# Patient Record
Sex: Male | Born: 1960
Health system: Southern US, Community
[De-identification: ages and names within clinical notes are randomized; demographics above are authoritative.]

## PROBLEM LIST (undated history)

## (undated) DIAGNOSIS — M1990 Unspecified osteoarthritis, unspecified site: Secondary | ICD-10-CM

## (undated) DIAGNOSIS — K5190 Ulcerative colitis, unspecified, without complications: Secondary | ICD-10-CM

## (undated) DIAGNOSIS — M069 Rheumatoid arthritis, unspecified: Secondary | ICD-10-CM

## (undated) DIAGNOSIS — K219 Gastro-esophageal reflux disease without esophagitis: Secondary | ICD-10-CM

## (undated) HISTORY — DX: Gastro-esophageal reflux disease without esophagitis: K21.9

## (undated) HISTORY — PX: HERNIA REPAIR: SHX51

## (undated) HISTORY — PX: COLONOSCOPY: SHX174

## (undated) HISTORY — DX: Rheumatoid arthritis, unspecified: M06.9

## (undated) HISTORY — PX: ANTERIOR CRUCIATE LIGAMENT REPAIR: SHX115

## (undated) HISTORY — DX: Unspecified osteoarthritis, unspecified site: M19.90

## (undated) HISTORY — DX: Ulcerative colitis, unspecified, without complications: K51.90

## (undated) HISTORY — PX: MOUTH SURGERY: SHX715

---

## 1998-01-12 ENCOUNTER — Encounter: Payer: Self-pay | Admitting: Emergency Medicine

## 1998-01-12 ENCOUNTER — Emergency Department (HOSPITAL_COMMUNITY): Admission: EM | Admit: 1998-01-12 | Discharge: 1998-01-12 | Payer: Self-pay | Admitting: Emergency Medicine

## 2012-08-13 ENCOUNTER — Ambulatory Visit (INDEPENDENT_AMBULATORY_CARE_PROVIDER_SITE_OTHER): Payer: Self-pay | Admitting: Family Medicine

## 2012-08-13 ENCOUNTER — Encounter: Payer: Self-pay | Admitting: Family Medicine

## 2012-08-13 VITALS — BP 110/68 | HR 76 | Temp 98.2°F | Resp 16 | Wt 176.0 lb

## 2012-08-13 DIAGNOSIS — K921 Melena: Secondary | ICD-10-CM

## 2012-08-13 DIAGNOSIS — M069 Rheumatoid arthritis, unspecified: Secondary | ICD-10-CM | POA: Insufficient documentation

## 2012-08-13 DIAGNOSIS — R197 Diarrhea, unspecified: Secondary | ICD-10-CM

## 2012-08-13 LAB — CBC WITH DIFFERENTIAL/PLATELET
Basophils Absolute: 0.1 10*3/uL (ref 0.0–0.1)
Basophils Relative: 1 % (ref 0–1)
Eosinophils Absolute: 0.4 10*3/uL (ref 0.0–0.7)
Eosinophils Relative: 6 % — ABNORMAL HIGH (ref 0–5)
HCT: 40 % (ref 39.0–52.0)
Hemoglobin: 13.3 g/dL (ref 13.0–17.0)
Lymphocytes Relative: 38 % (ref 12–46)
Lymphs Abs: 2.3 10*3/uL (ref 0.7–4.0)
MCH: 30.9 pg (ref 26.0–34.0)
MCHC: 33.3 g/dL (ref 30.0–36.0)
MCV: 92.8 fL (ref 78.0–100.0)
Monocytes Absolute: 0.7 10*3/uL (ref 0.1–1.0)
Monocytes Relative: 12 % (ref 3–12)
Neutro Abs: 2.7 10*3/uL (ref 1.7–7.7)
Neutrophils Relative %: 43 % (ref 43–77)
Platelets: 320 10*3/uL (ref 150–400)
RBC: 4.31 MIL/uL (ref 4.22–5.81)
RDW: 13.3 % (ref 11.5–15.5)
WBC: 6.2 10*3/uL (ref 4.0–10.5)

## 2012-08-13 LAB — BASIC METABOLIC PANEL
BUN: 8 mg/dL (ref 6–23)
CO2: 26 mEq/L (ref 19–32)
Calcium: 9.1 mg/dL (ref 8.4–10.5)
Chloride: 104 mEq/L (ref 96–112)
Creat: 1.03 mg/dL (ref 0.50–1.35)
Glucose, Bld: 99 mg/dL (ref 70–99)
Potassium: 4.9 mEq/L (ref 3.5–5.3)
Sodium: 138 mEq/L (ref 135–145)

## 2012-08-13 LAB — HEPATIC FUNCTION PANEL
ALT: 16 U/L (ref 0–53)
AST: 18 U/L (ref 0–37)
Albumin: 3.7 g/dL (ref 3.5–5.2)
Alkaline Phosphatase: 95 U/L (ref 39–117)
Bilirubin, Direct: 0.1 mg/dL (ref 0.0–0.3)
Indirect Bilirubin: 0.5 mg/dL (ref 0.0–0.9)
Total Bilirubin: 0.6 mg/dL (ref 0.3–1.2)
Total Protein: 6.7 g/dL (ref 6.0–8.3)

## 2012-08-13 LAB — TSH: TSH: 1.739 u[IU]/mL (ref 0.350–4.500)

## 2012-08-13 LAB — SEDIMENTATION RATE: Sed Rate: 32 mm/hr — ABNORMAL HIGH (ref 0–16)

## 2012-08-13 NOTE — Progress Notes (Signed)
  Subjective:    Patient ID: Terry Guerrero, male    DOB: 1960-05-10, 52 y.o.   MRN: [id number]  HPI  Patient reports several months of diarrhea immediately after eating. He states that he'll feel sudden urge to defecate. He then has a very watery bowel movement. He reports hematochezia. The blood is mixed in with the stool. It is not just from wiping. His significant past medical history of premature arthritis. Any fevers or myalgias or arthralgias. He denies any weight loss. Past Medical History  Diagnosis Date  . Rheumatoid arthritis    Current outpatient prescriptions:omeprazole (PRILOSEC) 20 MG capsule, Take 20 mg by mouth daily., Disp: , Rfl:     Review of Systems  All other systems reviewed and are negative.       Objective:   Physical Exam  Constitutional: He appears well-developed and well-nourished.  Neck: Neck supple. No thyromegaly present.  Cardiovascular: Normal rate, normal heart sounds and intact distal pulses.   No murmur heard. Pulmonary/Chest: Effort normal and breath sounds normal. No respiratory distress. He has no wheezes. He has no rales. He exhibits no tenderness.  Abdominal: Soft. Bowel sounds are normal. He exhibits no distension. There is no tenderness. There is no rebound and no guarding.          Assessment & Plan:  1. Hematochezia I am concerned this could reflect an underlying inflammatory bowel disease. I feel he needs a colonoscopy, not just for screening purposes, but to rule out inflammatory bowel disease. However a GI - Basic Metabolic Panel - Hepatic Function Panel - CBC with Differential - Sedimentation Rate - TSH - Ambulatory referral to Gastroenterology  2. Diarrhea We'll obtain some laboratory studies to rule out other metabolic causes of diarrhea. Differential diagnosis includes infectious colitis, inflammatory bowel disease, irritable bowel disease. - Basic Metabolic Panel - Hepatic Function Panel - CBC with Differential -  Sedimentation Rate - TSH - Ambulatory referral to Gastroenterology

## 2012-08-15 ENCOUNTER — Encounter: Payer: Self-pay | Admitting: Gastroenterology

## 2012-08-27 ENCOUNTER — Ambulatory Visit (INDEPENDENT_AMBULATORY_CARE_PROVIDER_SITE_OTHER): Payer: Self-pay | Admitting: Gastroenterology

## 2012-08-27 ENCOUNTER — Encounter: Payer: Self-pay | Admitting: Gastroenterology

## 2012-08-27 VITALS — BP 118/64 | HR 74 | Ht 67.0 in | Wt 178.0 lb

## 2012-08-27 DIAGNOSIS — K921 Melena: Secondary | ICD-10-CM

## 2012-08-27 DIAGNOSIS — R197 Diarrhea, unspecified: Secondary | ICD-10-CM

## 2012-08-27 MED ORDER — PEG-KCL-NACL-NASULF-NA ASC-C 100 G PO SOLR: 1.0000 | Freq: Once | ORAL | Status: DC

## 2012-08-27 NOTE — Progress Notes (Signed)
History of Present Illness: This is a 52 year old male who relates a history of bloody diarrhea beginning in December 2013. He was incarcerated when his symptoms began and has now been released. He was seen by his primary physician and blood work was performed that was unremarkable except for elevated sedimentation rate. He relates 4-8 bowel movements a day including nocturnal diarrhea. He notes frequent rectal bleeding with bowel movements. He has lower abdominal cramping prior to a BM and it resolves rapidly follow a BM. His appetite is good. Denies weight loss, abdominal pain, constipation, change in stool caliber, melena, nausea, vomiting, dysphagia, reflux symptoms, chest pain.   Review of Systems: Pertinent positive and negative review of systems were noted in the above HPI section. All other review of systems were otherwise negative.  Current Medications, Allergies, Past Medical History, Past Surgical History, Family History and Social History were reviewed in [employer] record.  Physical Exam: General: Well developed , well nourished, no acute distress Head: Normocephalic and atraumatic Eyes:  sclerae anicteric, EOMI Ears: Normal auditory acuity Mouth: No deformity or lesions Neck: Supple, no masses or thyromegaly Lungs: Clear throughout to auscultation Heart: Regular rate and rhythm; no murmurs, rubs or bruits Abdomen: Soft, non tender and non distended. No masses, hepatosplenomegaly or hernias noted. Normal Bowel sounds Rectal: No lesions, trace Hemoccult-positive soft stool in the rectal vault Musculoskeletal: Symmetrical with no gross deformities  Skin: No lesions on visible extremities Pulses:  Normal pulses noted Extremities: No clubbing, cyanosis, edema or deformities noted Neurological: Alert oriented x 4, grossly nonfocal Cervical Nodes:  No significant cervical adenopathy Inguinal Nodes: No significant inguinal adenopathy Psychological:  Alert and  cooperative. Normal mood and affect  Assessment and Recommendations:  1. Diarrhea and hematochezia. Rule out chronic infections, IBD, colorectal neoplasms and other disorders. Obtain all standard stool studies. Schedule colonoscopy. The risks, benefits, and alternatives to colonoscopy with possible biopsy and possible polypectomy were discussed with the patient and they consent to proceed.

## 2012-08-27 NOTE — Patient Instructions (Addendum)
You have been scheduled for a colonoscopy. Please follow written instructions given to you at your visit today.  Please pick up your prep kit at the pharmacy within the next 1-3 days. If you use inhalers (even only as needed), please bring them with you on the day of your procedure. Your physician has requested that you go to www.startemmi.com and enter the access code given to you at your visit today. This web site gives a general overview about your procedure. However, you should still follow specific instructions given to you by our office regarding your preparation for the procedure.  Thank you for choosing me and [facility].  Malcolm T. Stark, Jr., MD., FACG  

## 2012-08-28 ENCOUNTER — Other Ambulatory Visit: Payer: Self-pay

## 2012-08-28 DIAGNOSIS — R197 Diarrhea, unspecified: Secondary | ICD-10-CM

## 2012-08-28 DIAGNOSIS — K921 Melena: Secondary | ICD-10-CM

## 2012-09-02 ENCOUNTER — Telehealth: Payer: Self-pay | Admitting: Gastroenterology

## 2012-09-02 NOTE — Telephone Encounter (Signed)
OK 

## 2012-09-02 NOTE — Telephone Encounter (Signed)
Patient states he cannot afford the Movi prep at this time because he has no insurance. Told patient that I can give him a free prep if he will come by the office today. Patient then stated he actually would like cancel the Colonoscopy for tomorrow. Asked patient why he wanted to cancel now. Patient states he is worried about him not having insurance and the cost of the procedure added to other bills he has to pay. Patient states he gets insurance the middle of next month and will call back when he gets it then. Told him that it was important he call back and schedule and I will send this note to Dr. Fuller Plan to let him know.

## 2012-09-03 ENCOUNTER — Ambulatory Visit (HOSPITAL_COMMUNITY): Admission: RE | Admit: 2012-09-03 | Payer: Self-pay | Source: Ambulatory Visit | Admitting: Gastroenterology

## 2012-09-03 ENCOUNTER — Encounter (HOSPITAL_COMMUNITY): Admission: RE | Payer: Self-pay | Source: Ambulatory Visit

## 2012-09-03 SURGERY — COLONOSCOPY
Anesthesia: Moderate Sedation

## 2015-12-14 ENCOUNTER — Telehealth: Payer: Self-pay | Admitting: Family Medicine

## 2015-12-14 NOTE — Telephone Encounter (Signed)
Terry Guerrero called saying he now has [employer] and has gotten back on his feet and he's wondering if he can return as a patient of Dr. Dennard Schaumann. If possible, he's hoping to have his outstanding balance paid in full within the next couple of months. He'd like a phone call regarding this to see if he can return as a patient. Please give him a call.

## 2016-01-04 NOTE — Telephone Encounter (Signed)
I called and spoke with patient. He is aware that he has to pay off the bad debt first and was given the amount. He states he should have that taken care of within the next 30 days. He likes Dr. Dennard Schaumann and now has insurance.

## 2016-04-05 ENCOUNTER — Ambulatory Visit (INDEPENDENT_AMBULATORY_CARE_PROVIDER_SITE_OTHER): Payer: Medicare HMO | Admitting: Physician Assistant

## 2016-04-05 ENCOUNTER — Encounter: Payer: Self-pay | Admitting: Physician Assistant

## 2016-04-05 VITALS — BP 120/78 | HR 65 | Temp 97.9°F | Resp 16 | Wt 145.0 lb

## 2016-04-05 DIAGNOSIS — K4000 Bilateral inguinal hernia, with obstruction, without gangrene, not specified as recurrent: Secondary | ICD-10-CM | POA: Diagnosis not present

## 2016-04-05 MED ORDER — HYDROCODONE-ACETAMINOPHEN 5-325 MG PO TABS: 1.0000 | ORAL_TABLET | Freq: Four times a day (QID) | ORAL | 0 refills | Status: DC | PRN

## 2016-04-05 NOTE — Progress Notes (Addendum)
Patient ID: Terry Guerrero MRN: [id number], DOB: January 24, 1961, 55 y.o. Date of Encounter: 04/05/2016, 1:47 PM    Chief Complaint:  Chief Complaint  Patient presents with  . hernia in groin    lifting furniture     HPI: 55 y.o. year old male presents with above.   He reports he has no prior history of hernia. He states that for the past 6-8 months he has been seeing some bowl and feeling some discomfort with certain movements. However  he developed significantly increased amount of pain and symptoms for the past 2 days. He reports that on Tuesday 04/03/16 he moved address her in the chest of 12 hours. Is that he had lifted it and then he stepped entire and and that is when he developed severe increased pain and symptoms. Says that he thinks that there is a hernia in both groins, but worse on the right. Can feel the one on the right descending down towards his right testicle.     Home Meds:   Outpatient Medications Prior to Visit  Medication Sig Dispense Refill  . omeprazole (PRILOSEC) 20 MG capsule Take 20 mg by mouth daily.    . peg 3350 powder (MOVIPREP) 100 G SOLR Take 1 kit (100 g total) by mouth once. (Patient not taking: Reported on 04/05/2016) 1 kit 0   No facility-administered medications prior to visit.     Allergies:  Allergies  Allergen Reactions  . Gold-Containing Drug Products   . Naprosyn [Naproxen]       Review of Systems: See HPI for pertinent ROS. All other ROS negative.    Physical Exam: Blood pressure 120/78, pulse 65, temperature 97.9 F (36.6 C), temperature source Oral, resp. rate 16, weight 145 lb (65.8 kg), SpO2 98 %., Body mass index is 22.71 kg/m. General:  WNWD WM. Appears in no acute distress. Neck: Supple. No thyromegaly. No lymphadenopathy. Lungs: Clear bilaterally to auscultation without wheezes, rales, or rhonchi. Breathing is unlabored. Heart: Regular rhythm. No murmurs, rubs, or gallops. Abdomen: Soft, non-tender, non-distended with  normoactive bowel sounds. No hepatomegaly. No rebound/guarding. No obvious abdominal masses. Terry Guerrero, CMA was chaperone during exam: There is a bulge in right lower abdomen---this is reducible. There is hernia into inguinal canal on the right. Msk:  Strength and tone normal for age. Extremities/Skin: Warm and dry. Neuro: Alert and oriented X 3. Moves all extremities spontaneously. Gait is normal. CNII-XII grossly in tact. Psych:  Responds to questions appropriately with a normal affect.     ASSESSMENT AND PLAN:  55 y.o. year old male with  1. Bilateral inguinal hernia with obstruction and without gangrene, recurrence not specified He is experiencing pain. Prescribed hydrocodone to use for pain control. Will refer to Gen. Surgery--I have marked this as urgent. Discussed that he needs to avoid lifting and pushing pulling etc. He states that he is not employed he currently so this is not an issue. - HYDROcodone-acetaminophen (NORCO/VICODIN) 5-325 MG tablet; Take 1 tablet by mouth every 6 (six) hours as needed.  Dispense: 30 tablet; Refill: 0 - Ambulatory referral to General Surgery Addendum:  I checked with our Referral Nurse. Appt with Gen Surgery not until Jan 3rd. Had Her call back to [facility] to see if they could possibly see him sooner as I'm concerned he could have strangulated hernia. They stated they have no other openings sooner and that if his symptoms worsen and he needs to go to the emergency room. I called patient  to inform him of this. Got no answer but did get voicemail-- left message stating this that if symptoms worsen-- go to the emergency room.  [address] [place], [place], [facility] 04/05/2016 1:47 PM

## 2016-04-06 ENCOUNTER — Telehealth: Payer: Self-pay

## 2016-04-06 NOTE — Telephone Encounter (Signed)
Pt was seen in the office 12-14 and states he had a rough night last night with burning in his right leg and is req a RX be sent in for muscle spasms.  Pls advise

## 2016-04-09 MED ORDER — CYCLOBENZAPRINE HCL 10 MG PO TABS: 10.0000 mg | ORAL_TABLET | Freq: Three times a day (TID) | ORAL | 1 refills | Status: DC | PRN

## 2016-04-09 NOTE — Addendum Note (Signed)
Addended by: Vonna Kotyk A on: 04/09/2016 11:52 AM   Modules accepted: Orders

## 2016-04-09 NOTE — Telephone Encounter (Signed)
Flexeril 37m 1 po TID PRN muscle relaxant # 30 + 1 Caution pt this may cause drowsiness

## 2016-04-09 NOTE — Telephone Encounter (Signed)
Rx filled spoke with pt he is aware

## 2016-04-13 ENCOUNTER — Other Ambulatory Visit: Payer: Self-pay

## 2016-04-13 DIAGNOSIS — K4000 Bilateral inguinal hernia, with obstruction, without gangrene, not specified as recurrent: Secondary | ICD-10-CM

## 2016-04-13 MED ORDER — TRAMADOL HCL 50 MG PO TABS: ORAL_TABLET | ORAL | 0 refills | Status: DC

## 2016-04-13 NOTE — Telephone Encounter (Signed)
rx called in pt aware

## 2016-04-13 NOTE — Telephone Encounter (Signed)
Tell pt this med has to be hard copy and our office closing for holiday.  Will change to Tramadol, which can be called in. Tramadol 52m 1 -2 every 8 hours as needed for pain # 90 + 0

## 2016-04-13 NOTE — Telephone Encounter (Signed)
Pt called and stated he seems to be fine and is stable. Pt states his pain med is working however he is about out Pt states he is taking the pills every 4-6 hours and has enough to last until 12-25.  Ok to refill?

## 2016-04-25 ENCOUNTER — Other Ambulatory Visit: Payer: Self-pay | Admitting: Surgery

## 2016-04-25 DIAGNOSIS — K4020 Bilateral inguinal hernia, without obstruction or gangrene, not specified as recurrent: Secondary | ICD-10-CM | POA: Diagnosis not present

## 2016-05-05 ENCOUNTER — Other Ambulatory Visit: Payer: Self-pay | Admitting: Physician Assistant

## 2016-05-07 NOTE — Telephone Encounter (Signed)
ok 

## 2016-05-07 NOTE — Telephone Encounter (Signed)
Refills called in

## 2016-05-07 NOTE — Telephone Encounter (Signed)
Ok to refill 

## 2016-05-14 DIAGNOSIS — K4020 Bilateral inguinal hernia, without obstruction or gangrene, not specified as recurrent: Secondary | ICD-10-CM | POA: Diagnosis not present

## 2016-11-12 ENCOUNTER — Encounter: Payer: Self-pay | Admitting: Family Medicine

## 2016-11-12 ENCOUNTER — Ambulatory Visit (INDEPENDENT_AMBULATORY_CARE_PROVIDER_SITE_OTHER): Payer: Medicare HMO | Admitting: Family Medicine

## 2016-11-12 ENCOUNTER — Ambulatory Visit (HOSPITAL_COMMUNITY)
Admission: RE | Admit: 2016-11-12 | Discharge: 2016-11-12 | Disposition: A | Discharge status: Home / Self Care | Payer: Medicare HMO | Source: Ambulatory Visit | Attending: Family Medicine | Admitting: Family Medicine

## 2016-11-12 VITALS — BP 128/80 | HR 76 | Temp 97.3°F | Resp 18 | Ht 67.0 in | Wt 146.0 lb

## 2016-11-12 DIAGNOSIS — S29011A Strain of muscle and tendon of front wall of thorax, initial encounter: Secondary | ICD-10-CM

## 2016-11-12 DIAGNOSIS — R0789 Other chest pain: Secondary | ICD-10-CM | POA: Diagnosis not present

## 2016-11-12 DIAGNOSIS — R091 Pleurisy: Secondary | ICD-10-CM | POA: Diagnosis not present

## 2016-11-12 DIAGNOSIS — R079 Chest pain, unspecified: Secondary | ICD-10-CM | POA: Diagnosis not present

## 2016-11-12 IMAGING — DX DG CHEST 2V
2 series · 2 of 2 positions shown · non-contrast
Comparison: None.

CLINICAL DATA: Left upper chest pain

EXAM:
CHEST  2 VIEW

[chest pa]
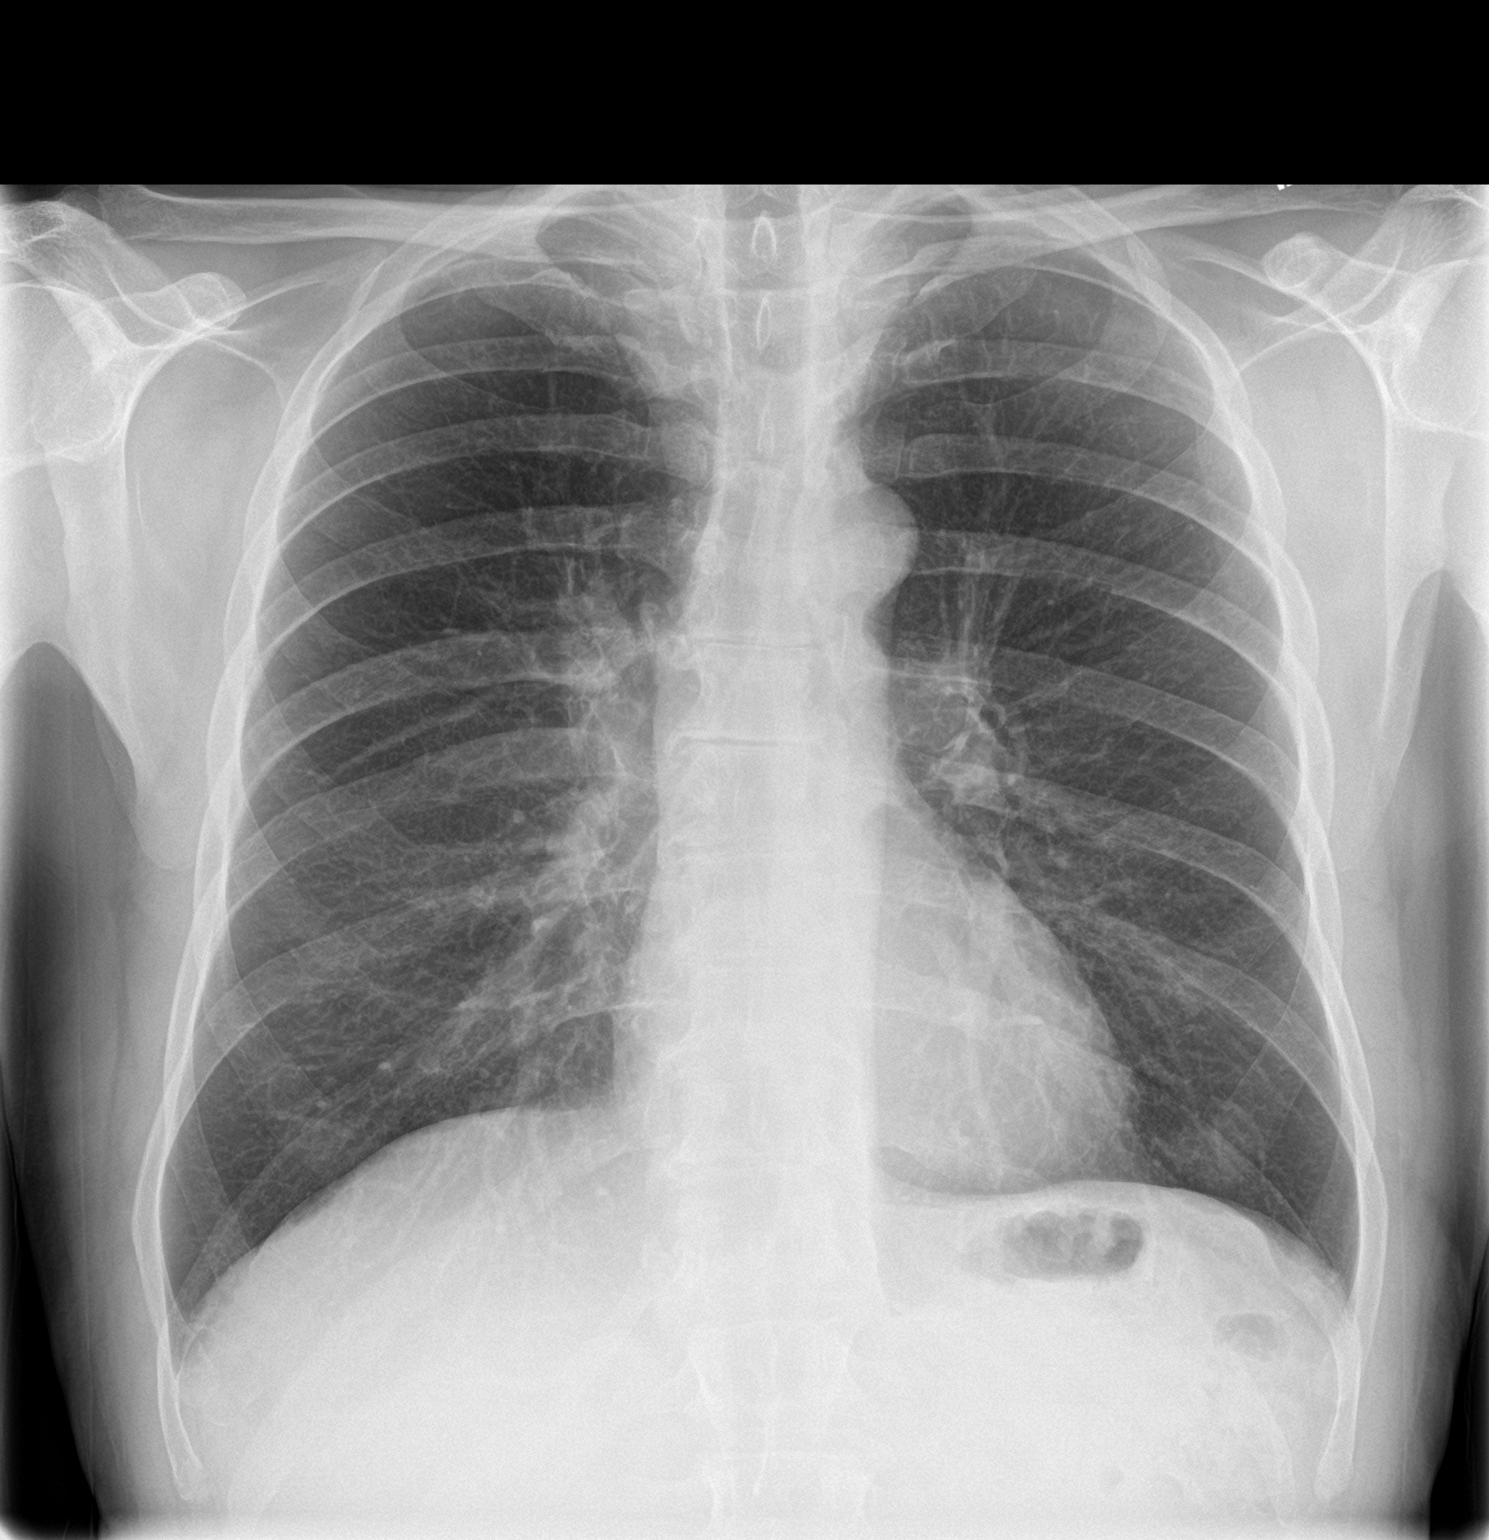

[chest lat]
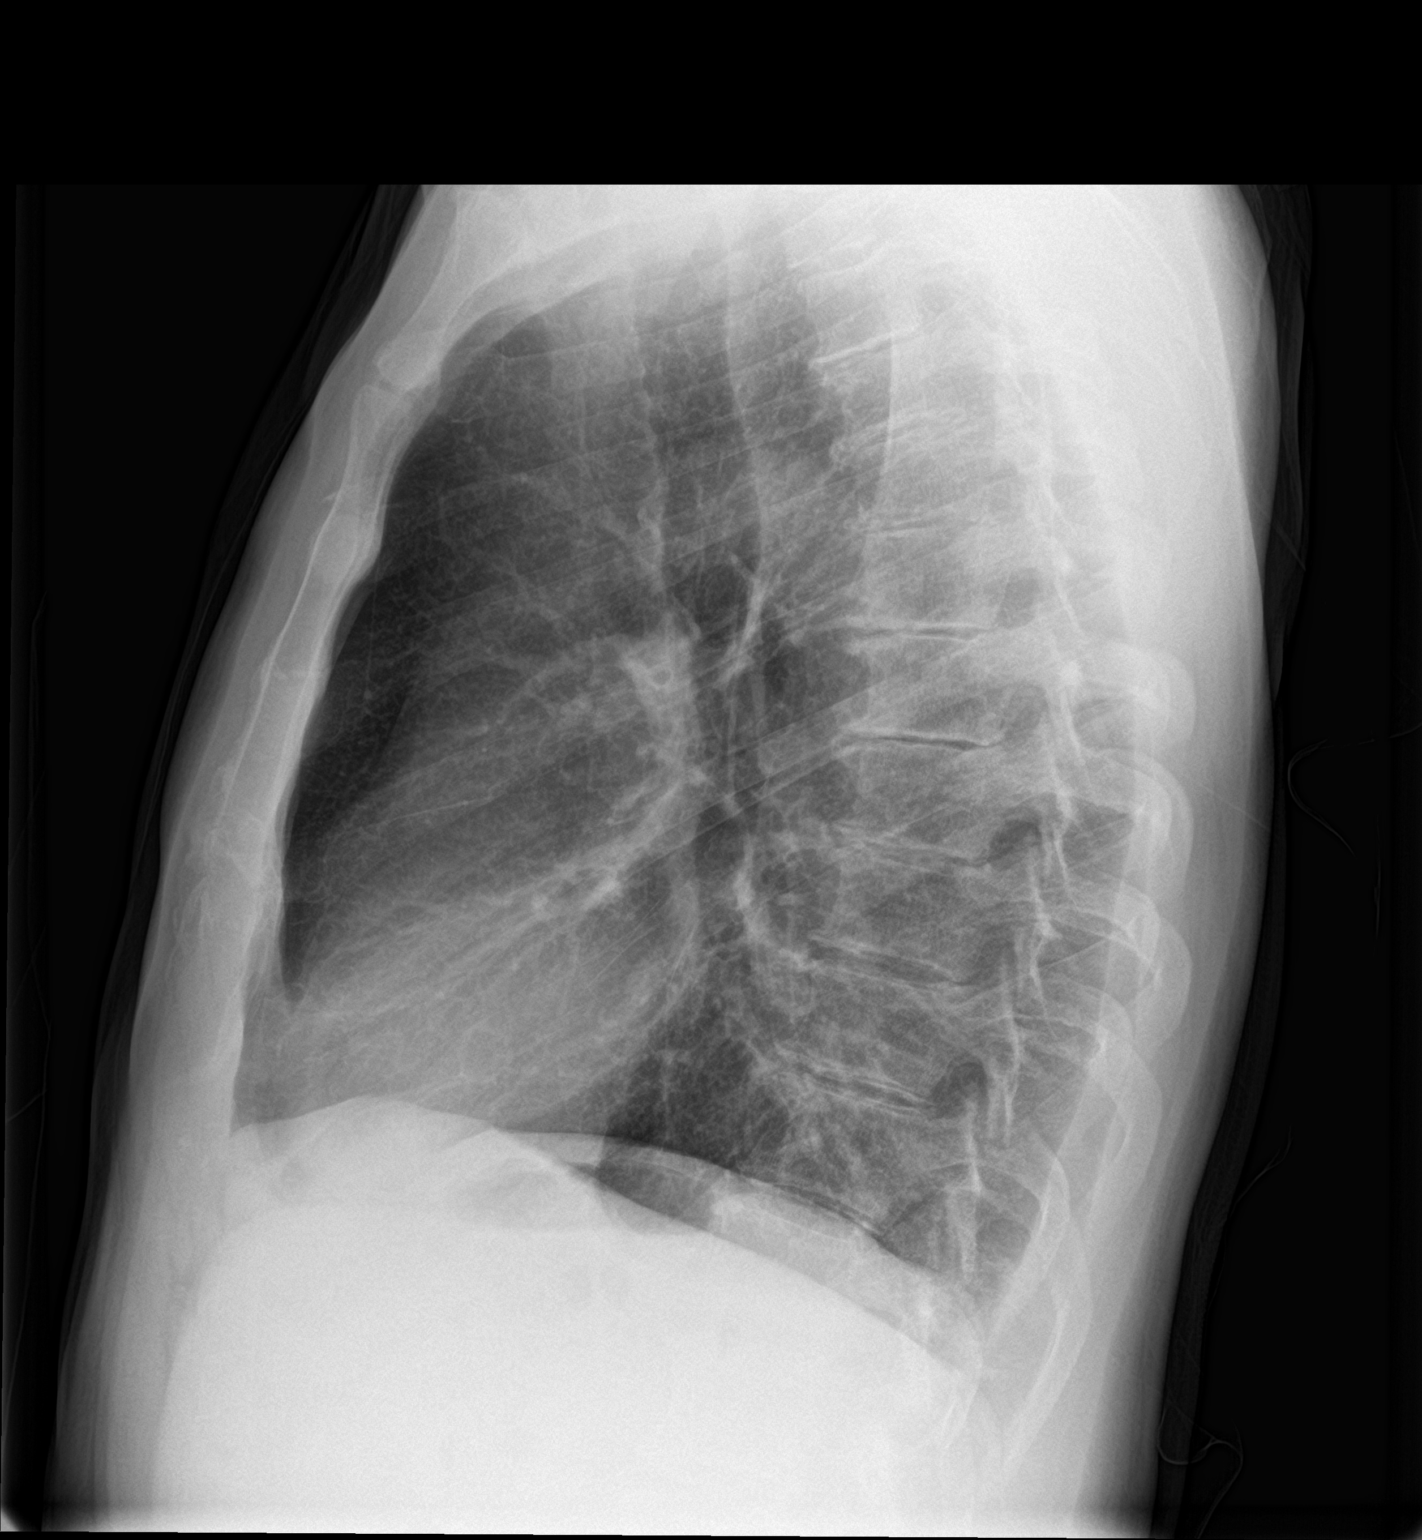

[2 of 2 positions shown; findings below may reference images not displayed]

FINDINGS: Heart and mediastinal contours are within normal limits. No focal
opacities or effusions. No acute bony abnormality.
IMPRESSION: No active cardiopulmonary disease.

## 2016-11-12 MED ORDER — HYDROCODONE-ACETAMINOPHEN 5-325 MG PO TABS: 1.0000 | ORAL_TABLET | Freq: Four times a day (QID) | ORAL | 0 refills | Status: DC | PRN

## 2016-11-12 NOTE — Progress Notes (Signed)
   Subjective:    Patient ID: Terry Guerrero, male    DOB: 26-Jun-1960, 56 y.o.   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 The patient was having to "bear hug" a large lifted onto the trailer. He felt a tearing sensation in the left side of his chest. Immediately thereafter he developed pain in his left clavicle and in the left chest wall that is worse with deep inspiration. It is worse with forward flexion of the shoulder as well as abduction of the shoulder. It is tender to palpation over the clavicle and in the chest muscle. He denies any angina. He denies any shortness of breath. He denies any hemoptysis. He does have some mild pleurisy with deep inspiration. He denies any cough. However it hurts now to move his arm Past Medical History:  Diagnosis Date  . GERD (gastroesophageal reflux disease)   . Rheumatoid arthritis(714.0)    Past Surgical History:  Procedure Laterality Date  . ANTERIOR CRUCIATE LIGAMENT REPAIR     Left    Current Outpatient Prescriptions on File Prior to Visit  Medication Sig Dispense Refill  . traMADol (ULTRAM) 50 MG tablet TAKE 1 TABLET BY MOUTH EVERY 8 HOURS AS NEEDED 90 tablet 0   No current facility-administered medications on file prior to visit.    Allergies  Allergen Reactions  . Gold-Containing Drug Products   . Naprosyn [Naproxen]    Social History   Social History  . Marital status: Divorced    Spouse name: N/A  . Number of children: 1  . Years of education: N/A   Occupational History  . Disabled     Social History Main Topics  . Smoking status: Former Research scientist (life sciences)  . Smokeless tobacco: Never Used  . Alcohol use Yes     Comment: 2 drinks daily   . Drug use: No  . Sexual activity: Not on file   Other Topics Concern  . Not on file   Social History Narrative   Daily caffeine       Review of Systems  All other systems reviewed and are negative.      Objective:   Physical Exam    Constitutional: He appears well-developed and well-nourished.  Neck: No JVD present.  Cardiovascular: Normal rate, regular rhythm and normal heart sounds.   Pulmonary/Chest: Effort normal and breath sounds normal. No respiratory distress. He has no wheezes. He has no rales. He exhibits tenderness.  Musculoskeletal: He exhibits no edema.       Arms: Vitals reviewed.   Pain is in the area marked in red on the diagram      Assessment & Plan:  Pleurisy - Plan: DG Chest 2 View, HYDROcodone-acetaminophen (NORCO) 5-325 MG tablet  Strain of left pectoralis muscle, initial encounter  I believe the patient likely strained his pectoralis muscle. There is no pathology in the shoulder. I do not suspect a fracture clavicle fracture rib however given his smoking history and the chest wall pain, I will check a chest x-ray to be thorough. I do not believe this is cardiovascular in nature as he is asymptomatic and is not having any angina or shortness of breath. I will treat the patient's pain with Norco 5/325 one by mouth every 6 hours when necessary pain. This should gradually improve over the next 2-3 weeks. Reassess immediately if symptoms worsen or change

## 2016-12-28 ENCOUNTER — Telehealth: Payer: Self-pay | Admitting: *Deleted

## 2016-12-28 DIAGNOSIS — R091 Pleurisy: Secondary | ICD-10-CM

## 2016-12-28 MED ORDER — HYDROCODONE-ACETAMINOPHEN 5-325 MG PO TABS
1.0000 | ORAL_TABLET | Freq: Four times a day (QID) | ORAL | 0 refills | Status: DC | PRN
Start: 2016-12-28 — End: 2017-02-12

## 2016-12-28 NOTE — Telephone Encounter (Signed)
Received call from patient.   Requested refill on Hydrcodone.   Ok to refill??  Last office visit/ refill 11/12/2016.

## 2016-12-28 NOTE — Telephone Encounter (Signed)
Prescription printed and patient made aware to come to office to pick up on Monday.

## 2016-12-28 NOTE — Telephone Encounter (Signed)
Marcus with 20.

## 2017-02-12 ENCOUNTER — Ambulatory Visit (HOSPITAL_COMMUNITY)
Admission: RE | Admit: 2017-02-12 | Discharge: 2017-02-12 | Disposition: A | Discharge status: Home / Self Care | Payer: Medicare HMO | Source: Ambulatory Visit | Attending: Family Medicine | Admitting: Family Medicine

## 2017-02-12 ENCOUNTER — Ambulatory Visit (INDEPENDENT_AMBULATORY_CARE_PROVIDER_SITE_OTHER): Payer: Medicare HMO | Admitting: Family Medicine

## 2017-02-12 ENCOUNTER — Encounter: Payer: Self-pay | Admitting: Family Medicine

## 2017-02-12 VITALS — BP 140/80 | HR 78 | Temp 97.8°F | Resp 18 | Ht 67.0 in | Wt 147.0 lb

## 2017-02-12 DIAGNOSIS — M898X1 Other specified disorders of bone, shoulder: Secondary | ICD-10-CM | POA: Diagnosis not present

## 2017-02-12 DIAGNOSIS — S29011D Strain of muscle and tendon of front wall of thorax, subsequent encounter: Secondary | ICD-10-CM

## 2017-02-12 DIAGNOSIS — M25512 Pain in left shoulder: Secondary | ICD-10-CM | POA: Diagnosis not present

## 2017-02-12 IMAGING — DX DG CLAVICLE*L*
2 series · 2 of 2 positions shown · non-contrast
Comparison: None.

CLINICAL DATA: Left clavicle pain

EXAM:
LEFT CLAVICLE - 2+ VIEWS

[clavicle ap]
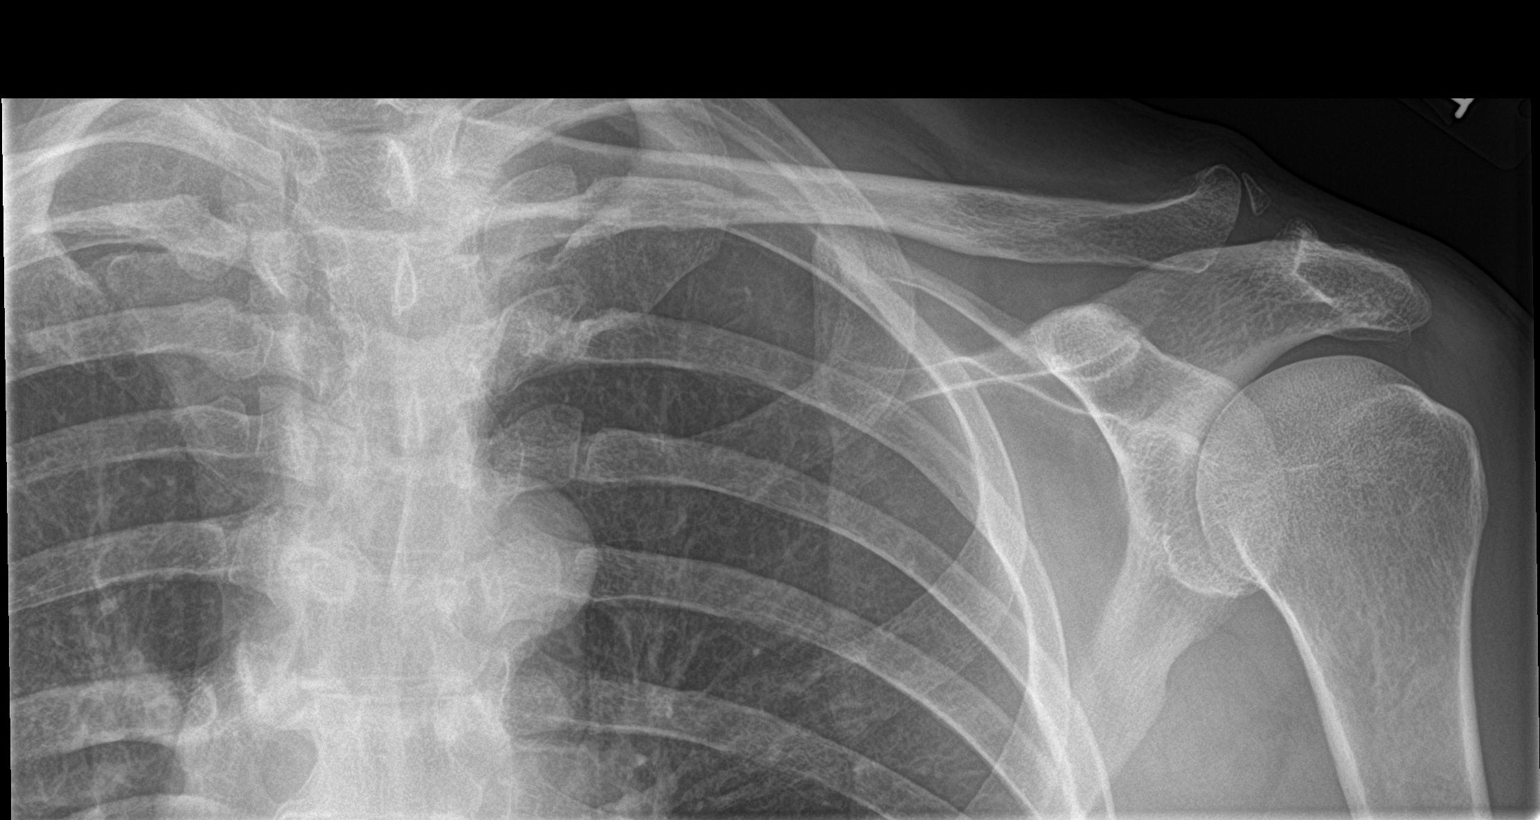

[clavicle axial]
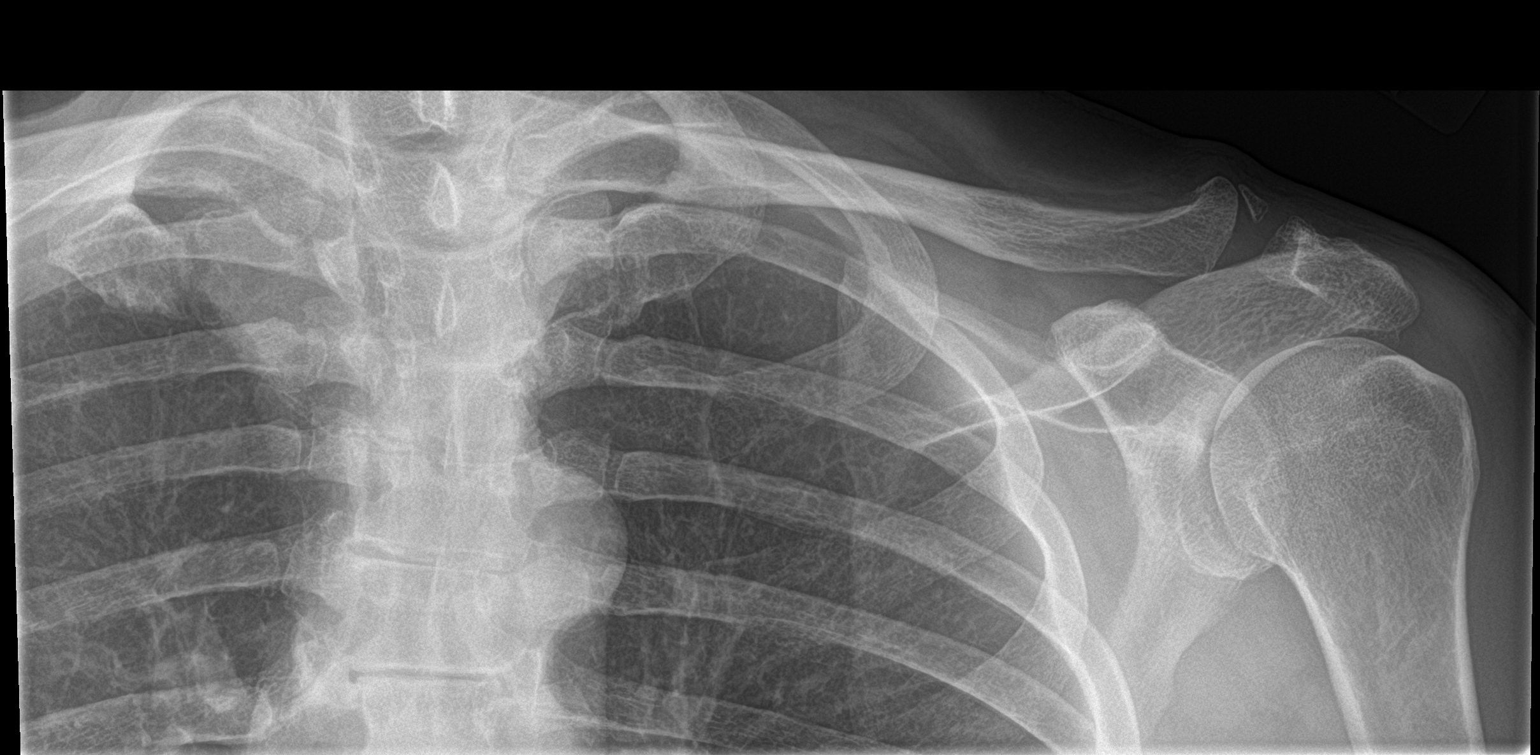

[2 of 2 positions shown; findings below may reference images not displayed]

FINDINGS: Widening of the left AC joint, possibly related to prior distal
clavicle resection. Recommend clinical correlation. Glenohumeral
joint is intact and maintained. No acute fracture, subluxation or
dislocation.
IMPRESSION: Probable prior distal left clavicle resection. Recommend clinical
correlation. No acute findings.

## 2017-02-12 MED ORDER — HYDROCODONE-ACETAMINOPHEN 5-325 MG PO TABS: 1.0000 | ORAL_TABLET | Freq: Four times a day (QID) | ORAL | 0 refills | Status: DC | PRN

## 2017-02-12 NOTE — Progress Notes (Signed)
Subjective:    Patient ID: Terry Guerrero, male    DOB: 07-31-1960, 56 y.o.   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 The patient was having to "bear hug" a large log lifted onto the trailer. He felt a tearing sensation in the left side of his chest. Immediately thereafter he developed pain in his left clavicle and in the left chest wall that is worse with deep inspiration. It is worse with forward flexion of the shoulder as well as abduction of the shoulder. It is tender to palpation over the clavicle and in the chest muscle. He denies any angina. He denies any shortness of breath. He denies any hemoptysis. He does have some mild pleurisy with deep inspiration. He denies any cough. However it hurts now to move his arm"  "Pleurisy - Plan: DG Chest 2 View, HYDROcodone-acetaminophen ([place]) 5-325 MG tablet  Strain of left pectoralis muscle, initial encounter  I believe the patient likely strained his pectoralis muscle. There is no pathology in the shoulder. I do not suspect a fracture clavicle fracture rib however given his smoking history and the chest wall pain, I will check a chest x-ray to be thorough. I do not believe this is cardiovascular in nature as he is asymptomatic and is not having any angina or shortness of breath. I will treat the patient's pain with Norco 5/325 one by mouth every 6 hours when necessary pain. This should gradually improve over the next 2-3 weeks. Reassess immediately if symptoms worsen or change"  02/12/17 Here for follow up.  CXR was unremarkable.  Patient was gradually improving over the last 3 months until recently when he reached back to grab a child who was falling off a ride.  He instantly felt a pop in his left clavicle.  Since that time, he has had a difficult time reaching across his body due to severe pain.  He is  unable to reach across his body against any resistance because the pain intensifies.  He also is unable to sleep at night because the pain.  I believe he has torn the pectoralis muscle from the clavicle although the sternal attachment is fine.  Internal rotation of the shoulder and cross body reach cause severe pain in the clavicle and in the upper left chest. Past Medical History:  Diagnosis Date  . GERD (gastroesophageal reflux disease)   . Rheumatoid arthritis(714.0)    Past Surgical History:  Procedure Laterality Date  . ANTERIOR CRUCIATE LIGAMENT REPAIR     Left    Current Outpatient Prescriptions on File Prior to Visit  Medication Sig Dispense Refill  . HYDROcodone-acetaminophen (NORCO) 5-325 MG tablet Take 1 tablet by mouth every 6 (six) hours as needed for moderate pain. 20 tablet 0  . traMADol (ULTRAM) 50 MG tablet TAKE 1 TABLET BY MOUTH EVERY 8 HOURS AS NEEDED 90 tablet 0   No current facility-administered medications on file prior to visit.    Allergies  Allergen Reactions  . Gold-Containing Drug Products   . Naprosyn [Naproxen]    Social History   Social History  . Marital status: Divorced    Spouse name: N/A  . Number of children: 1  . Years of education: N/A   Occupational History  . Disabled     Social History Main Topics  . Smoking status:  Former Smoker  . Smokeless tobacco: Never Used  . Alcohol use Yes     Comment: 2 drinks daily   . Drug use: No  . Sexual activity: Not on file   Other Topics Concern  . Not on file   Social History Narrative   Daily caffeine       Review of Systems  All other systems reviewed and are negative.      Objective:   Physical Exam  Constitutional: He appears well-developed and well-nourished.  Cardiovascular: Normal rate, regular rhythm and normal heart sounds.   Pulmonary/Chest: Effort normal and breath sounds normal. No respiratory distress. He has no wheezes. He has no rales.    Musculoskeletal:        Left shoulder: He exhibits decreased range of motion, pain and decreased strength. He exhibits no tenderness and no bony tenderness.  Vitals reviewed.         Assessment & Plan:  Pain of left clavicle - Plan: DG Clavicle Left  Pleurisy - Plan: HYDROcodone-acetaminophen (NORCO) 5-325 MG tablet  I will refill the patient's hydrocodone that he takes sparingly at night to help him sleep.  I believe he has torn the pectoralis muscle from the attachment on the clavicle.  I will obtain an x-ray of the clavicle to rule out a fracture.  Patient's pain has been present ever since July despite conservative management.  Therefore I will also consult orthopedics.  I anticipate that they will recommend physical therapy but the patient would like to get a second opinion.  If they agree, I would happily consult physical therapy.  I recommended rest but the patient states that he is unable to rest his arm because of his employment.

## 2017-02-13 ENCOUNTER — Ambulatory Visit (INDEPENDENT_AMBULATORY_CARE_PROVIDER_SITE_OTHER): Payer: Medicare HMO | Admitting: Orthopaedic Surgery

## 2017-02-13 DIAGNOSIS — S29011A Strain of muscle and tendon of front wall of thorax, initial encounter: Secondary | ICD-10-CM

## 2017-02-13 NOTE — Progress Notes (Signed)
   Office Visit Note   Patient: Terry Guerrero           Date of Birth: 1961/03/16           MRN: [id number] Visit Date: 02/13/2017              Requested by: Susy Frizzle, MD [address] [place], [place] [address] PCP: Susy Frizzle, MD   Assessment & Plan: Visit Diagnoses:  1. Strain of left pectoralis muscle, initial encounter     Plan: Impression is left pectoralis muscle strain. Questions encouraged and answered.  Referral to physical therapy.  NSAIDs as needed.  Follow-Up Instructions: Return if symptoms worsen or fail to improve.   Orders:  No orders of the defined types were placed in this encounter.  No orders of the defined types were placed in this encounter.     Procedures: No procedures performed   Clinical Data: No additional findings.   Subjective: No chief complaint on file.   Patient is a very pleasant 56 year old gentleman who comes in with left pectoral pain for approximately 2 months after trying to catch a child from falling.  He abruptly stretched his arm out and felt a pull in the infraclavicular region on the left side.  Denies any numbness and tingling.  He also endorses some discomfort in the left posterior trapezial region.  Denies any constitutional symptoms.  Pain does not radiate.    Review of Systems  Constitutional: Negative.   All other systems reviewed and are negative.    Objective: Vital Signs: There were no vitals taken for this visit.  Physical Exam  Constitutional: He is oriented to person, place, and time. He appears well-developed and well-nourished.  HENT:  Head: Normocephalic and atraumatic.  Eyes: Pupils are equal, round, and reactive to light.  Neck: Neck supple.  Pulmonary/Chest: Effort normal.  Abdominal: Soft.  Musculoskeletal: Normal range of motion.  Neurological: He is alert and oriented to person, place, and time.  Skin: Skin is warm.  Psychiatric: He has a normal mood and affect. His  behavior is normal. Judgment and thought content normal.  Nursing note and vitals reviewed.   Ortho Exam Left chest and shoulder exam shows tenderness along the clavicular insertion of the pectoralis major muscle.  He has a symmetric axillary fold without any evidence of a pectoralis tendon tear.  Trapezius is mildly tender.  No focal findings on shoulder function. Specialty Comments:  No specialty comments available.  Imaging: No results found.   PMFS History: Patient Active Problem List   Diagnosis Date Noted  . Strain of left pectoralis muscle 02/13/2017  . Rheumatoid arthritis(714.0)    Past Medical History:  Diagnosis Date  . GERD (gastroesophageal reflux disease)   . Rheumatoid arthritis(714.0)     Family History  Problem Relation Age of Onset  . Colon cancer Neg Hx     Past Surgical History:  Procedure Laterality Date  . ANTERIOR CRUCIATE LIGAMENT REPAIR     Left    Social History   Occupational History  . Disabled     Social History Main Topics  . Smoking status: Former Research scientist (life sciences)  . Smokeless tobacco: Never Used  . Alcohol use Yes     Comment: 2 drinks daily   . Drug use: No  . Sexual activity: Not on file

## 2017-07-19 ENCOUNTER — Ambulatory Visit (INDEPENDENT_AMBULATORY_CARE_PROVIDER_SITE_OTHER): Payer: Medicare HMO | Admitting: Family Medicine

## 2017-07-19 ENCOUNTER — Encounter: Payer: Self-pay | Admitting: Family Medicine

## 2017-07-19 ENCOUNTER — Other Ambulatory Visit: Payer: Self-pay

## 2017-07-19 VITALS — BP 122/80 | HR 70 | Temp 97.7°F | Resp 16 | Ht 67.0 in | Wt 149.8 lb

## 2017-07-19 DIAGNOSIS — M545 Low back pain, unspecified: Secondary | ICD-10-CM

## 2017-07-19 LAB — URINALYSIS, ROUTINE W REFLEX MICROSCOPIC
BACTERIA UA: NONE SEEN /HPF
Bilirubin Urine: NEGATIVE
Glucose, UA: NEGATIVE
HGB URINE DIPSTICK: NEGATIVE
HYALINE CAST: NONE SEEN /LPF
KETONES UR: NEGATIVE
LEUKOCYTES UA: NEGATIVE
Nitrite: NEGATIVE
PH: 5.5 (ref 5.0–8.0)
Protein, ur: NEGATIVE
RBC / HPF: NONE SEEN /HPF (ref 0–2)
Specific Gravity, Urine: 1.015 (ref 1.001–1.03)
Squamous Epithelial / LPF: NONE SEEN /HPF (ref <=5)
WBC UA: NONE SEEN /HPF (ref 0–5)

## 2017-07-19 MED ORDER — CYCLOBENZAPRINE HCL 10 MG PO TABS: 10.0000 mg | ORAL_TABLET | Freq: Three times a day (TID) | ORAL | 0 refills | Status: DC | PRN

## 2017-07-19 MED ORDER — KETOROLAC TROMETHAMINE 60 MG/2ML IM SOLN: 60.0000 mg | Freq: Once | INTRAMUSCULAR | Status: DC

## 2017-07-19 MED ORDER — HYDROCODONE-ACETAMINOPHEN 5-325 MG PO TABS: 1.0000 | ORAL_TABLET | Freq: Three times a day (TID) | ORAL | 0 refills | Status: AC | PRN

## 2017-07-19 MED ORDER — KETOROLAC TROMETHAMINE 60 MG/2ML IM SOLN
60.0000 mg | Freq: Once | INTRAMUSCULAR | Status: AC
  Administered 2017-07-19: 60 mg via INTRAMUSCULAR

## 2017-07-19 NOTE — Patient Instructions (Addendum)
Continue to take over-the-counter medications like Tylenol for pain.  I would limit use of BC powders because it can cause bleeding.    Apply heat and ice to the area 3-4 times a day indirectly the skin for no more than 15-20 minutes.  Resting and decreasing strenuous activity is very important to allow muscles to heal.  Gentle stretching and frequent position changes should also help with the healing process.  He did not feel better and roughly 1 month I believe physical therapy will be very beneficial to help you get back to normal.  I have prescribed muscle relaxers, Flexeril, which you should only take sparingly and as needed for muscle spasms for extreme stiffness.  I have also prescribed Norco, which you have had prescribed recently in the past by Dr. Dennard Schaumann, this should also be used sparingly for severe pain that is unrelieved with over-the-counter medications.    Do not take Flexeril and Norco at the same time    Low Back Strain A strain is a stretch or tear in a muscle or the strong cords of tissue that attach muscle to bone (tendons). Strains of the lower back (lumbar spine) are a common cause of low back pain. A strain occurs when muscles or tendons are torn or are stretched beyond their limits. The muscles may become inflamed, resulting in pain and sudden muscle tightening (spasms). A strain can happen suddenly due to an injury (trauma), or it can develop gradually due to overuse. There are three types of strains:  Grade 1 is a mild strain involving a minor tear of the muscle fibers or tendons. This may cause some pain but no loss of muscle strength.  Grade 2 is a moderate strain involving a partial tear of the muscle fibers or tendons. This causes more severe pain and some loss of muscle strength.  Grade 3 is a severe strain involving a complete tear of the muscle or tendon. This causes severe pain and complete or nearly complete loss of muscle strength.  What are the causes? This  condition may be caused by:  Trauma, such as a fall or a hit to the body.  Twisting or overstretching the back. This may result from doing activities that require a lot of energy, such as lifting heavy objects.  What increases the risk? The following factors may increase your risk of getting this condition:  Playing contact sports.  Participating in sports or activities that put excessive stress on the back and require a lot of bending and twisting, including: ? Lifting weights or heavy objects. ? Gymnastics. ? Soccer. ? Figure skating. ? Snowboarding.  Being overweight or obese.  Having poor strength and flexibility.  What are the signs or symptoms? Symptoms of this condition may include:  Sharp or dull pain in the lower back that does not go away. Pain may extend to the buttocks.  Stiffness.  Limited range of motion.  Inability to stand up straight due to stiffness or pain.  Muscle spasms.  How is this diagnosed? This condition may be diagnosed based on:  Your symptoms.  Your medical history.  A physical exam. ? Your health care provider may push on certain areas of your back to determine the source of your pain. ? You may be asked to bend forward, backward, and side to side to assess the severity of your pain and your range of motion.  Imaging tests, such as: ? X-rays. ? MRI.  How is this treated? Treatment for this  condition may include:  Applying heat and cold to the affected area.  Medicines to help relieve pain and to relax your muscles (muscle relaxants).  NSAIDs to help reduce swelling and discomfort.  Physical therapy.  When your symptoms improve, it is important to gradually return to your normal routine as soon as possible to reduce pain, avoid stiffness, and avoid loss of muscle strength. Generally, symptoms should improve within 6 weeks of treatment. However, recovery time varies. Follow these instructions at home: Managing pain, stiffness,  and swelling  If directed, apply ice to the injured area during the first 24 hours after your injury. ? Put ice in a plastic bag. ? Place a towel between your skin and the bag. ? Leave the ice on for 20 minutes, 2-3 times a day.  If directed, apply heat to the affected area as often as told by your health care provider. Use the heat source that your health care provider recommends, such as a moist heat pack or a heating pad. ? Place a towel between your skin and the heat source. ? Leave the heat on for 20-30 minutes. ? Remove the heat if your skin turns bright red. This is especially important if you are unable to feel pain, heat, or cold. You may have a greater risk of getting burned. Activity  Rest and return to your normal activities as told by your health care provider. Ask your health care provider what activities are safe for you.  Avoid activities that take a lot of effort (are strenuous) for as long as told by your health care provider.  Do exercises as told by your health care provider. General instructions   Take over-the-counter and prescription medicines only as told by your health care provider.  If you have questions or concerns about safety while taking pain medicine, talk with your health care provider.  Do not drive or operate heavy machinery until you know how your pain medicine affects you.  Do not use any tobacco products, such as cigarettes, chewing tobacco, and e-cigarettes. Tobacco can delay bone healing. If you need help quitting, ask your health care provider.  Keep all follow-up visits as told by your health care provider. This is important. How is this prevented?  Warm up and stretch before being active.  Cool down and stretch after being active.  Give your body time to rest between periods of activity.  Avoid: ? Being physically inactive for long periods at a time. ? Exercising or playing sports when you are tired or in pain.  Use correct form  when playing sports and lifting heavy objects.  Use good posture when sitting and standing.  Maintain a healthy weight.  Sleep on a mattress with medium firmness to support your back.  Make sure to use equipment that fits you, including shoes that fit well.  Be safe and responsible while being active to avoid falls.  Do at least 150 minutes of moderate-intensity exercise each week, such as brisk walking or water aerobics. Try a form of exercise that takes stress off your back, such as swimming or stationary cycling.  Maintain physical fitness, including: ? Strength. ? Flexibility. ? Cardiovascular fitness. ? Endurance. Contact a health care provider if:  Your back pain does not improve after 6 weeks of treatment.  Your symptoms get worse. Get help right away if:  Your back pain is severe.  You are unable to stand or walk.  You develop pain in your legs.  You develop weakness  in your buttocks or legs.  You have difficulty controlling when you urinate or when you have a bowel movement. This information is not intended to replace advice given to you by your health care provider. Make sure you discuss any questions you have with your health care provider. Document Released: 04/09/2005 Document Revised: 12/15/2015 Document Reviewed: 01/19/2015 Elsevier Interactive Patient Education  2017 [employer].
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
## 2017-08-20 ENCOUNTER — Ambulatory Visit (INDEPENDENT_AMBULATORY_CARE_PROVIDER_SITE_OTHER): Payer: Medicare HMO | Admitting: Family Medicine

## 2017-08-20 ENCOUNTER — Encounter: Payer: Self-pay | Admitting: Family Medicine

## 2017-08-20 VITALS — BP 130/80 | HR 86 | Temp 97.7°F | Resp 18 | Ht 67.0 in | Wt 147.0 lb

## 2017-08-20 DIAGNOSIS — S46211A Strain of muscle, fascia and tendon of other parts of biceps, right arm, initial encounter: Secondary | ICD-10-CM | POA: Diagnosis not present

## 2017-08-20 DIAGNOSIS — Z13220 Encounter for screening for lipoid disorders: Secondary | ICD-10-CM | POA: Diagnosis not present

## 2017-08-20 DIAGNOSIS — Z125 Encounter for screening for malignant neoplasm of prostate: Secondary | ICD-10-CM | POA: Diagnosis not present

## 2017-08-20 LAB — CBC WITH DIFFERENTIAL/PLATELET
BASOS ABS: 83 {cells}/uL (ref 0–200)
Basophils Relative: 1.1 %
EOS ABS: 360 {cells}/uL (ref 15–500)
Eosinophils Relative: 4.8 %
HEMATOCRIT: 43.8 % (ref 38.5–50.0)
Hemoglobin: 14.7 g/dL (ref 13.2–17.1)
LYMPHS ABS: 2235 {cells}/uL (ref 850–3900)
MCH: 30.9 pg (ref 27.0–33.0)
MCHC: 33.6 g/dL (ref 32.0–36.0)
MCV: 92 fL (ref 80.0–100.0)
MPV: 10.1 fL (ref 7.5–12.5)
Monocytes Relative: 9.1 %
NEUTROS PCT: 55.2 %
Neutro Abs: 4140 cells/uL (ref 1500–7800)
Platelets: 324 10*3/uL (ref 140–400)
RBC: 4.76 10*6/uL (ref 4.20–5.80)
RDW: 12.1 % (ref 11.0–15.0)
Total Lymphocyte: 29.8 %
WBC: 7.5 10*3/uL (ref 3.8–10.8)
WBCMIX: 683 {cells}/uL (ref 200–950)

## 2017-08-20 LAB — COMPLETE METABOLIC PANEL WITH GFR
AG RATIO: 1.5 (calc) (ref 1.0–2.5)
ALT: 10 U/L (ref 9–46)
AST: 17 U/L (ref 10–35)
Albumin: 4.3 g/dL (ref 3.6–5.1)
Alkaline phosphatase (APISO): 78 U/L (ref 40–115)
BUN: 8 mg/dL (ref 7–25)
CO2: 27 mmol/L (ref 20–32)
Calcium: 9.3 mg/dL (ref 8.6–10.3)
Chloride: 106 mmol/L (ref 98–110)
Creat: 0.95 mg/dL (ref 0.70–1.33)
GFR, EST AFRICAN AMERICAN: 103 mL/min/{1.73_m2} (ref >=60)
GFR, Est Non African American: 89 mL/min/{1.73_m2} (ref >=60)
GLOBULIN: 2.9 g/dL (ref 1.9–3.7)
Glucose, Bld: 91 mg/dL (ref 65–99)
POTASSIUM: 4.7 mmol/L (ref 3.5–5.3)
SODIUM: 140 mmol/L (ref 135–146)
Total Bilirubin: 0.4 mg/dL (ref 0.2–1.2)
Total Protein: 7.2 g/dL (ref 6.1–8.1)

## 2017-08-20 LAB — LIPID PANEL
Cholesterol: 185 mg/dL (ref <200)
HDL: 45 mg/dL (ref >40)
LDL Cholesterol (Calc): 126 mg/dL (calc) — ABNORMAL HIGH
Non-HDL Cholesterol (Calc): 140 mg/dL (calc) — ABNORMAL HIGH (ref <130)
Total CHOL/HDL Ratio: 4.1 (calc) (ref <5.0)
Triglycerides: 52 mg/dL (ref <150)

## 2017-08-20 LAB — PSA: PSA: 0.5 ng/mL (ref <=4.0)

## 2017-08-20 MED ORDER — HYDROCODONE-ACETAMINOPHEN 5-325 MG PO TABS: 1.0000 | ORAL_TABLET | Freq: Four times a day (QID) | ORAL | 0 refills | Status: DC | PRN

## 2017-08-20 MED ORDER — CYCLOBENZAPRINE HCL 10 MG PO TABS: 10.0000 mg | ORAL_TABLET | Freq: Three times a day (TID) | ORAL | 0 refills | Status: DC | PRN

## 2017-08-20 NOTE — Progress Notes (Signed)
Subjective:    Patient ID: Terry Guerrero, male    DOB: [birth year], 57 y.o.   MRN: [id number]  HPI  Patient was bending over to touch his toes yesterday when he felt a pop in the proximal portion of his biceps over the lateral shoulder near the deltoid.  Immediately after, he developed a muscle spasm and a Popeye deformity in the distal bicep near the antecubital fossa.  He is here today complaining of moderate to severe pain in the distal right bicep.  Patient has 3-4 out of 5 strength with resisted elbow flexion.  Weakness is primarily due to pain.  There is a visible deformity in the area of the long head of the biceps.  The short head still seems to be intact.  Patient is also overdue for general medical care.  He has never had a colonoscopy.  He is due for prostate cancer screening with PSA.  He smokes and therefore he is due for Pneumovax 23.  He has not had fasting lab work in many many years.  He consents to fasting lab work today.  He declines Pneumovax 23.  He defers a colonoscopy at the present time. Past Medical History:  Diagnosis Date  . GERD (gastroesophageal reflux disease)   . Rheumatoid arthritis(714.0)    Past Surgical History:  Procedure Laterality Date  . ANTERIOR CRUCIATE LIGAMENT REPAIR     Left    No current outpatient medications on file prior to visit.   No current facility-administered medications on file prior to visit.    Allergies  Allergen Reactions  . Gold-Containing Drug Products   . Naprosyn [Naproxen]    Social History   Socioeconomic History  . Marital status: Divorced    Spouse name: Not on file  . Number of children: 1  . Years of education: Not on file  . Highest education level: Not on file  Occupational History  . Occupation: Disabled   Social Needs  . Financial resource strain: Not on file  . Food insecurity:    Worry: Not on file    Inability: Not on file  . Transportation needs:    Medical: Not on file    Non-medical: Not on  file  Tobacco Use  . Smoking status: Former Research scientist (life sciences)  . Smokeless tobacco: Never Used  Substance and Sexual Activity  . Alcohol use: Yes    Comment: 2 drinks daily   . Drug use: No  . Sexual activity: Not on file  Lifestyle  . Physical activity:    Days per week: Not on file    Minutes per session: Not on file  . Stress: Not on file  Relationships  . Social connections:    Talks on phone: Not on file    Gets together: Not on file    Attends religious service: Not on file    Active member of club or organization: Not on file    Attends meetings of clubs or organizations: Not on file    Relationship status: Not on file  . Intimate partner violence:    Fear of current or ex partner: Not on file    Emotionally abused: Not on file    Physically abused: Not on file    Forced sexual activity: Not on file  Other Topics Concern  . Not on file  Social History Narrative   Daily caffeine      Review of Systems  All other systems reviewed and are negative.  Objective:   Physical Exam  Constitutional: He appears well-developed and well-nourished.  Cardiovascular: Normal rate, regular rhythm and normal heart sounds.  Pulmonary/Chest: Effort normal and breath sounds normal. No stridor. No respiratory distress. He has no wheezes.  Musculoskeletal:       Right upper arm: He exhibits tenderness, swelling and deformity. He exhibits no bony tenderness.       Arms: Vitals reviewed.         Assessment & Plan:  Biceps muscle tear, right, initial encounter - Plan: cyclobenzaprine (FLEXERIL) 10 MG tablet, HYDROcodone-acetaminophen (NORCO) 5-325 MG tablet, Ambulatory referral to Orthopedic Surgery  Prostate cancer screening - Plan: PSA  Screening cholesterol level - Plan: cyclobenzaprine (FLEXERIL) 10 MG tablet, HYDROcodone-acetaminophen (NORCO) 5-325 MG tablet, CBC with Differential/Platelet, COMPLETE METABOLIC PANEL WITH GFR, Lipid panel  Biceps rupture, proximal, right, initial  encounter  I believe the patient has suffered a tear in the long head of the biceps.  I believe the short head of the biceps is still attached.  We discussed conservative management versus operative management.  Patient performs physical labor for livelihood.  Therefore he is interested in discussing with an orthopedic surgeon, his surgical options.  Therefore I will consult orthopedics.  Meanwhile treat the patient's pain with Flexeril 10 mg every 8 hours as needed for muscle spasms.  He can use Norco 5/325 1 p.o. every 6 hours as needed pain.  While the patient is here, I will obtain fasting lab work including a CBC, CMP, fasting lipid panel, and a PSA to screen for prostate cancer.  I recommended Pneumovax 23 as well as a colonoscopy but the patient politely declined.

## 2017-09-02 DIAGNOSIS — S46111A Strain of muscle, fascia and tendon of long head of biceps, right arm, initial encounter: Secondary | ICD-10-CM | POA: Diagnosis not present

## 2017-09-02 DIAGNOSIS — M25511 Pain in right shoulder: Secondary | ICD-10-CM | POA: Diagnosis not present

## 2017-09-02 DIAGNOSIS — M25512 Pain in left shoulder: Secondary | ICD-10-CM | POA: Diagnosis not present

## 2017-09-25 ENCOUNTER — Telehealth: Payer: Self-pay | Admitting: Family Medicine

## 2017-09-25 DIAGNOSIS — S46211A Strain of muscle, fascia and tendon of other parts of biceps, right arm, initial encounter: Secondary | ICD-10-CM

## 2017-09-25 DIAGNOSIS — Z13220 Encounter for screening for lipoid disorders: Secondary | ICD-10-CM

## 2017-09-25 MED ORDER — HYDROCODONE-ACETAMINOPHEN 5-325 MG PO TABS: 1.0000 | ORAL_TABLET | Freq: Four times a day (QID) | ORAL | 0 refills | Status: DC | PRN

## 2017-09-25 NOTE — Telephone Encounter (Signed)
Patient is requesting a refill on Hydrocodone   LOV: 08/20/17  LRF:   07/24/17     Pt has seen ortho and per pt per md no treatment for this - will have to mend on its own as surgery would not benefit him. Aware that this message will get seen by MD on Thursday morning.

## 2017-09-25 NOTE — Telephone Encounter (Signed)
Pt aware.

## 2017-09-25 NOTE — Telephone Encounter (Signed)
I refilled 30 norco.

## 2017-10-16 DIAGNOSIS — R69 Illness, unspecified: Secondary | ICD-10-CM | POA: Diagnosis not present

## 2018-04-23 HISTORY — PX: HERNIA REPAIR: SHX51

## 2019-05-04 ENCOUNTER — Encounter: Payer: Self-pay | Admitting: Family Medicine

## 2019-05-04 ENCOUNTER — Ambulatory Visit (INDEPENDENT_AMBULATORY_CARE_PROVIDER_SITE_OTHER): Payer: Medicare HMO | Admitting: Family Medicine

## 2019-05-04 ENCOUNTER — Other Ambulatory Visit: Payer: Self-pay

## 2019-05-04 VITALS — BP 122/78 | HR 76 | Temp 97.3°F | Resp 16 | Ht 67.0 in | Wt 152.0 lb

## 2019-05-04 DIAGNOSIS — K921 Melena: Secondary | ICD-10-CM | POA: Diagnosis not present

## 2019-05-04 NOTE — Progress Notes (Signed)
Subjective:    Patient ID: Terry Guerrero, male    DOB: Apr 09, 1961, 59 y.o.   MRN: [id number]  HPI  Patient seldom comes to the doctor.  He states that over the last 9 months he is experiencing bright red blood per rectum every time he defecates.  Initially it was just a few drops of blood.  Now he states that he sees a teaspoon or more of blood almost every time he has a bowel movement.  He denies any pain with defecation.  He denies any perirectal mass.  He denies any hemorrhoids.  He denies any nausea or vomiting or abdominal pain.  He denies any chest pain shortness of breath dyspnea on exertion or orthostatic dizziness.  He denies any syncope or near syncope.  He is not taking any blood thinners or aspirin or NSAIDs. Past Medical History:  Diagnosis Date  . GERD (gastroesophageal reflux disease)   . Rheumatoid arthritis(714.0)    Past Surgical History:  Procedure Laterality Date  . ANTERIOR CRUCIATE LIGAMENT REPAIR     Left    No current outpatient medications on file prior to visit.   No current facility-administered medications on file prior to visit.   Allergies  Allergen Reactions  . Gold-Containing Drug Products   . Naprosyn [Naproxen]    Social History   Socioeconomic History  . Marital status: Divorced    Spouse name: Not on file  . Number of children: 1  . Years of education: Not on file  . Highest education level: Not on file  Occupational History  . Occupation: Disabled   Tobacco Use  . Smoking status: Former Research scientist (life sciences)  . Smokeless tobacco: Never Used  Substance and Sexual Activity  . Alcohol use: Yes    Comment: 2 drinks daily   . Drug use: No  . Sexual activity: Not on file  Other Topics Concern  . Not on file  Social History Narrative   Daily caffeine    Social Determinants of Health   Financial Resource Strain:   . Difficulty of Paying Living Expenses: Not on file  Food Insecurity:   . Worried About Charity fundraiser in the Last Year: Not on  file  . Ran Out of Food in the Last Year: Not on file  Transportation Needs:   . Lack of Transportation (Medical): Not on file  . Lack of Transportation (Non-Medical): Not on file  Physical Activity:   . Days of Exercise per Week: Not on file  . Minutes of Exercise per Session: Not on file  Stress:   . Feeling of Stress : Not on file  Social Connections:   . Frequency of Communication with Friends and Family: Not on file  . Frequency of Social Gatherings with Friends and Family: Not on file  . Attends Religious Services: Not on file  . Active Member of Clubs or Organizations: Not on file  . Attends Archivist Meetings: Not on file  . Marital Status: Not on file  Intimate Partner Violence:   . Fear of Current or Ex-Partner: Not on file  . Emotionally Abused: Not on file  . Physically Abused: Not on file  . Sexually Abused: Not on file     Review of Systems  All other systems reviewed and are negative.      Objective:   Physical Exam Vitals reviewed.  Constitutional:      Appearance: He is well-developed.  Cardiovascular:     Rate and Rhythm: Normal  rate and regular rhythm.     Heart sounds: Normal heart sounds.  Pulmonary:     Effort: Pulmonary effort is normal. No respiratory distress.     Breath sounds: Normal breath sounds. No stridor. No wheezing.  Abdominal:     General: Abdomen is flat. Bowel sounds are normal. There is no distension.     Palpations: Abdomen is soft.     Tenderness: There is no abdominal tenderness. There is no guarding.  Genitourinary:    Rectum: Normal. No mass, tenderness, anal fissure, external hemorrhoid or internal hemorrhoid. Normal anal tone.           Assessment & Plan:  Hematochezia - Plan: CBC with Differential, COMPLETE METABOLIC PANEL WITH GFR, Ambulatory referral to Gastroenterology  Patient needs to see GI positive for colonoscopy.  Differential diagnosis includes colon cancer, diverticulosis, AVM, etc.  Rectal  exam was performed today and as far as I can palpate with my index finger I do not appreciate any mass.  I will check a CBC to rule out severe life-threatening anemia.  Recommended the patient begin taking iron pills/ferrous sulfate 325 mg daily empirically as I anticipate anemia due to chronic bleeding.

## 2019-05-05 LAB — COMPLETE METABOLIC PANEL WITH GFR
AG Ratio: 1.4 (calc) (ref 1.0–2.5)
ALT: 11 U/L (ref 9–46)
AST: 17 U/L (ref 10–35)
Albumin: 4.2 g/dL (ref 3.6–5.1)
Alkaline phosphatase (APISO): 70 U/L (ref 35–144)
BUN: 10 mg/dL (ref 7–25)
CO2: 25 mmol/L (ref 20–32)
Calcium: 9.2 mg/dL (ref 8.6–10.3)
Chloride: 107 mmol/L (ref 98–110)
Creat: 1.02 mg/dL (ref 0.70–1.33)
GFR, Est African American: 94 mL/min/{1.73_m2} (ref >=60)
GFR, Est Non African American: 81 mL/min/{1.73_m2} (ref >=60)
Globulin: 2.9 g/dL (calc) (ref 1.9–3.7)
Glucose, Bld: 97 mg/dL (ref 65–99)
Potassium: 4.1 mmol/L (ref 3.5–5.3)
Sodium: 140 mmol/L (ref 135–146)
Total Bilirubin: 0.8 mg/dL (ref 0.2–1.2)
Total Protein: 7.1 g/dL (ref 6.1–8.1)

## 2019-05-05 LAB — CBC WITH DIFFERENTIAL/PLATELET
Absolute Monocytes: 889 cells/uL (ref 200–950)
Basophils Absolute: 105 cells/uL (ref 0–200)
Basophils Relative: 0.9 %
Eosinophils Absolute: 386 cells/uL (ref 15–500)
Eosinophils Relative: 3.3 %
HCT: 42.3 % (ref 38.5–50.0)
Hemoglobin: 14.2 g/dL (ref 13.2–17.1)
Lymphs Abs: 2551 cells/uL (ref 850–3900)
MCH: 31.6 pg (ref 27.0–33.0)
MCHC: 33.6 g/dL (ref 32.0–36.0)
MCV: 94.2 fL (ref 80.0–100.0)
MPV: 10.3 fL (ref 7.5–12.5)
Monocytes Relative: 7.6 %
Neutro Abs: 7769 cells/uL (ref 1500–7800)
Neutrophils Relative %: 66.4 %
Platelets: 345 10*3/uL (ref 140–400)
RBC: 4.49 10*6/uL (ref 4.20–5.80)
RDW: 12.2 % (ref 11.0–15.0)
Total Lymphocyte: 21.8 %
WBC: 11.7 10*3/uL — ABNORMAL HIGH (ref 3.8–10.8)

## 2019-05-07 ENCOUNTER — Ambulatory Visit: Payer: Medicare HMO | Admitting: Gastroenterology

## 2019-05-07 ENCOUNTER — Other Ambulatory Visit: Payer: Self-pay

## 2019-05-07 ENCOUNTER — Encounter: Payer: Self-pay | Admitting: Gastroenterology

## 2019-05-07 VITALS — BP 108/70 | HR 74 | Temp 97.9°F | Ht 67.0 in | Wt 153.0 lb

## 2019-05-07 DIAGNOSIS — Z01818 Encounter for other preprocedural examination: Secondary | ICD-10-CM | POA: Diagnosis not present

## 2019-05-07 DIAGNOSIS — K625 Hemorrhage of anus and rectum: Secondary | ICD-10-CM | POA: Insufficient documentation

## 2019-05-07 MED ORDER — SUPREP BOWEL PREP KIT 17.5-3.13-1.6 GM/177ML PO SOLN: 1.0000 | ORAL | 0 refills | Status: DC

## 2019-05-07 NOTE — Progress Notes (Signed)
Reviewed and agree with management plan.  Brenton Joines T. Shianne Zeiser, MD FACG [facility]  

## 2019-05-07 NOTE — Patient Instructions (Signed)
You have been scheduled for a colonoscopy. Please follow written instructions given to you at your visit today.  Please pick up your prep supplies at the pharmacy within the next 1-3 days. If you use inhalers (even only as needed), please bring them with you on the day of your procedure. Your physician has requested that you go to www.startemmi.com and enter the access code given to you at your visit today. This web site gives a general overview about your procedure. However, you should still follow specific instructions given to you by our office regarding your preparation for the procedure.  If you are age 51 or older, your body mass index should be between 23-30. Your Body mass index is 23.96 kg/m. If this is out of the aforementioned range listed, please consider follow up with your Primary Care Provider.  If you are age 65 or younger, your body mass index should be between 19-25. Your Body mass index is 23.96 kg/m. If this is out of the aformentioned range listed, please consider follow up with your Primary Care Provider.   Due to recent changes in healthcare laws, you may see the results of your imaging and laboratory studies on MyChart before your provider has had a chance to review them.  We understand that in some cases there may be results that are confusing or concerning to you. Not all laboratory results come back in the same time frame and the provider may be waiting for multiple results in order to interpret others.  Please give [place] 48 hours in order for your provider to thoroughly review all the results before contacting the office for clarification of your results.

## 2019-05-07 NOTE — Progress Notes (Signed)
     05/07/2019 Terry Guerrero [id number] 04-25-1960   HISTORY OF PRESENT ILLNESS: This is a pleasant 59 year old male who was seen by Dr. Fuller Plan back in 2014 for complaints of diarrhea and rectal bleeding.  At that point Dr. Fuller Plan recommended colonoscopy, but patient ultimately ended up canceling the procedure due to not having insurance.  Nonetheless, he says that his rectal bleeding resolved back then, but now over the past 4 months or so he has begun to see bright red blood again in his stool, on the tissue paper, and in the toilet water.  He says it is mostly red in color, some darker colored blood as well.  This only occurs with bowel movements.  He says that once or twice a day when he has a bowel movement he gets kind of a "stomachache' then he goes to move his bowels and he sees the blood with it.  He says that his bowel movements themselves are normal.  He does report some abdominal discomfort just above the umbilicus that hangs around.   Past Medical History:  Diagnosis Date  . GERD (gastroesophageal reflux disease)   . Rheumatoid arthritis(714.0)    Past Surgical History:  Procedure Laterality Date  . ANTERIOR CRUCIATE LIGAMENT REPAIR     Left     reports that he has quit smoking. He has never used smokeless tobacco. He reports current alcohol use. He reports that he does not use drugs. family history is not on file. Allergies  Allergen Reactions  . Gold-Containing Drug Products   . Naprosyn [Naproxen]       No outpatient encounter medications on file as of 05/07/2019.   No facility-administered encounter medications on file as of 05/07/2019.     REVIEW OF SYSTEMS  : All other systems reviewed and negative except where noted in the History of Present Illness.   PHYSICAL EXAM: There were no vitals taken for this visit. General: Well developed white male in no acute distress Head: Normocephalic and atraumatic Eyes:  Sclerae anicteric, conjunctiva pink. Ears: Normal  auditory acuity Lungs: Clear throughout to auscultation; no increased WOB. Heart: Regular rate and rhythm; no M/R/G. Abdomen: Soft, non-distended.  BS present.  Non-tender. Rectal:  Will be done at the time of colonoscopy. Musculoskeletal: Symmetrical with no gross deformities  Skin: No lesions on visible extremities Extremities: No edema  Neurological: Alert oriented x 4, grossly non-focal Psychological:  Alert and cooperative. Normal mood and affect  ASSESSMENT AND PLAN: *Rectal bleeding: Occurs with bowel movements only, but sees it in the toilet bowl, on a stool, and on the toilet paper.  Has been occurring for a few months, but worsening.  Recent hemoglobin normal.  We'll plan for colonoscopy with Dr. Fuller Plan in the near future.  **The risks, benefits, and alternatives to colonoscopy were discussed with the patient and he consents to proceed.   CC:  Susy Frizzle, MD

## 2019-05-08 ENCOUNTER — Ambulatory Visit (INDEPENDENT_AMBULATORY_CARE_PROVIDER_SITE_OTHER): Payer: Self-pay

## 2019-05-08 DIAGNOSIS — Z1159 Encounter for screening for other viral diseases: Secondary | ICD-10-CM | POA: Diagnosis not present

## 2019-05-08 LAB — SARS CORONAVIRUS 2 (TAT 6-24 HRS): SARS Coronavirus 2: NEGATIVE

## 2019-05-12 ENCOUNTER — Encounter: Payer: Self-pay | Admitting: Gastroenterology

## 2019-05-12 ENCOUNTER — Other Ambulatory Visit: Payer: Self-pay

## 2019-05-12 ENCOUNTER — Ambulatory Visit (AMBULATORY_SURGERY_CENTER): Payer: Medicare HMO | Admitting: Gastroenterology

## 2019-05-12 VITALS — BP 103/66 | HR 67 | Temp 98.1°F | Resp 11 | Ht 67.0 in | Wt 153.0 lb

## 2019-05-12 DIAGNOSIS — K5730 Diverticulosis of large intestine without perforation or abscess without bleeding: Secondary | ICD-10-CM | POA: Diagnosis not present

## 2019-05-12 DIAGNOSIS — D123 Benign neoplasm of transverse colon: Secondary | ICD-10-CM

## 2019-05-12 DIAGNOSIS — K529 Noninfective gastroenteritis and colitis, unspecified: Secondary | ICD-10-CM

## 2019-05-12 DIAGNOSIS — K921 Melena: Secondary | ICD-10-CM | POA: Diagnosis not present

## 2019-05-12 DIAGNOSIS — K621 Rectal polyp: Secondary | ICD-10-CM

## 2019-05-12 DIAGNOSIS — K625 Hemorrhage of anus and rectum: Secondary | ICD-10-CM | POA: Diagnosis not present

## 2019-05-12 DIAGNOSIS — K5150 Left sided colitis without complications: Secondary | ICD-10-CM | POA: Diagnosis not present

## 2019-05-12 DIAGNOSIS — D128 Benign neoplasm of rectum: Secondary | ICD-10-CM

## 2019-05-12 MED ORDER — SODIUM CHLORIDE 0.9 % IV SOLN: 500.0000 mL | Freq: Once | INTRAVENOUS | Status: DC

## 2019-05-12 MED ORDER — FLEET ENEMA 7-19 GM/118ML RE ENEM
1.0000 | ENEMA | Freq: Once | RECTAL | Status: AC
  Administered 2019-05-12: 08:00:00 1 via RECTAL

## 2019-05-12 NOTE — Progress Notes (Signed)
Pt tolerated well. VSS. Awake and to recovery. 

## 2019-05-12 NOTE — Progress Notes (Signed)
Called to room to assist during endoscopic procedure.  Patient ID and intended procedure confirmed with present staff. Received instructions for my participation in the procedure from the performing physician.  

## 2019-05-12 NOTE — Op Note (Signed)
[facility] Patient Name: Terry Guerrero Procedure Date: 05/12/2019 8:37 AM MRN: [id number] Endoscopist: Ladene Artist , MD Age: 59 Referring MD:  Date of Birth: 11/16/1960 Gender: Male Account #: [id number] Procedure:                Colonoscopy Indications:              Hematochezia Medicines:                Monitored Anesthesia Care Procedure:                Pre-Anesthesia Assessment:                           - Prior to the procedure, a History and Physical                            was performed, and patient medications and                            allergies were reviewed. The patient's tolerance of                            previous anesthesia was also reviewed. The risks                            and benefits of the procedure and the sedation                            options and risks were discussed with the patient.                            All questions were answered, and informed consent                            was obtained. Prior Anticoagulants: The patient has                            taken no previous anticoagulant or antiplatelet                            agents. ASA Grade Assessment: II - A patient with                            mild systemic disease. After reviewing the risks                            and benefits, the patient was deemed in                            satisfactory condition to undergo the procedure.                           After obtaining informed consent, the colonoscope  was passed under direct vision. Throughout the                            procedure, the patient's blood pressure, pulse, and                            oxygen saturations were monitored continuously. The                            Colonoscope was introduced through the anus and                            advanced to the the terminal ileum, with                            identification of the appendiceal orifice and IC                  valve. Anatomical landmarks were photographed. The                            quality of the bowel preparation was good. The                            colonoscopy was performed without difficulty. The                            patient tolerated the procedure well. Scope In: 8:43:01 AM Scope Out: 9:00:45 AM Scope Withdrawal Time: 0 hours 14 minutes 23 seconds  Total Procedure Duration: 0 hours 17 minutes 44 seconds  Findings:                 The perianal and digital rectal examinations were                            normal.                           Two sessile polyps were found in the rectum and                            transverse colon. The polyps were 6 to 7 mm in                            size. These polyps were removed with a cold snare.                            Resection and retrieval were complete.                           Multiple medium-mouthed diverticula were found in                            the left colon. There was narrowing of the colon in  association with the diverticular opening. There                            was evidence of diverticular spasm. There was no                            evidence of diverticular bleeding.                           Diffuse moderate inflammation characterized by                            altered vascularity, congestion (edema), erythema,                            friability and granularity was found in the rectum                            and in the sigmoid colon. Biopsies were taken with                            a cold forceps for histology.                           The exam was otherwise without abnormality on                            direct and retroflexion views.                           The terminal ileum appeared normal. Complications:            No immediate complications. Estimated blood loss:                            None. Estimated Blood Loss:     Estimated blood loss:  none. Impression:               - Two 6 to 7 mm polyps in the rectum and in the                            transverse colon, removed with a cold snare.                            Resected and retrieved.                           - Moderate diverticulosis in the left colon.                           - Diffuse moderate inflammation was found in the                            rectum and in the sigmoid colon secondary to  proctosigmoid colitis. Biopsied.                           - The examination was otherwise normal on direct                            and retroflexion views.                           - The examined portion of the ileum was normal. Recommendation:           - Repeat colonoscopy after studies are complete for                            surveillance based on pathology results.                           - Patient has a contact number available for                            emergencies. The signs and symptoms of potential                            delayed complications were discussed with the                            patient. Return to normal activities tomorrow.                            Written discharge instructions were provided to the                            patient.                           - Resume previous diet.                           - Continue present medications.                           - Await pathology results.                           - No aspirin, ibuprofen, naproxen, or other                            non-steroidal anti-inflammatory drugs for 2 weeks                            after polyp removal.                           - Return to GI office in 2-3 weeks with me or APP. Ladene Artist, MD 05/12/2019 9:06:57 AM This report has been signed electronically.

## 2019-05-12 NOTE — Progress Notes (Signed)
Temp  JB  VS DT  Pt's states no medical or surgical changes since previsit or office visit. Admitting RN reviewed

## 2019-05-12 NOTE — Patient Instructions (Signed)
No aspirin, ibuprofen, naproxen, or other non-steroidal anti-inflammatory drugs for 2 weeks after polyp removal. Return to GI office in 2-3 weeks with Dr. Fuller Plan.   Handouts given on polyps and diverticulosis.  YOU HAD AN ENDOSCOPIC PROCEDURE TODAY AT [facility]:   Refer to the procedure report that was given to you for any specific questions about what was found during the examination.  If the procedure report does not answer your questions, please call your gastroenterologist to clarify.  If you requested that your care partner not be given the details of your procedure findings, then the procedure report has been included in a sealed envelope for you to review at your convenience later.  YOU SHOULD EXPECT: Some feelings of bloating in the abdomen. Passage of more gas than usual.  Walking can help get rid of the air that was put into your GI tract during the procedure and reduce the bloating. If you had a lower endoscopy (such as a colonoscopy or flexible sigmoidoscopy) you may notice spotting of blood in your stool or on the toilet paper. If you underwent a bowel prep for your procedure, you may not have a normal bowel movement for a few days.  Please Note:  You might notice some irritation and congestion in your nose or some drainage.  This is from the oxygen used during your procedure.  There is no need for concern and it should clear up in a day or so.  SYMPTOMS TO REPORT IMMEDIATELY:   Following lower endoscopy (colonoscopy or flexible sigmoidoscopy):  Excessive amounts of blood in the stool  Significant tenderness or worsening of abdominal pains  Swelling of the abdomen that is new, acute  Fever of 100F or higher   For urgent or emergent issues, a gastroenterologist can be reached at any hour by calling [phone].   DIET:  We do recommend a small meal at first, but then you may proceed to your regular diet.  Drink plenty of fluids but you should avoid alcoholic  beverages for 24 hours.  ACTIVITY:  You should plan to take it easy for the rest of today and you should NOT DRIVE or use heavy machinery until tomorrow (because of the sedation medicines used during the test).    FOLLOW UP: Our staff will call the number listed on your records 48-72 hours following your procedure to check on you and address any questions or concerns that you may have regarding the information given to you following your procedure. If we do not reach you, we will leave a message.  We will attempt to reach you two times.  During this call, we will ask if you have developed any symptoms of COVID 19. If you develop any symptoms (ie: fever, flu-like symptoms, shortness of breath, cough etc.) before then, please call [phone].  If you test positive for Covid 19 in the 2 weeks post procedure, please call and report this information to [place].    If any biopsies were taken you will be contacted by phone or by letter within the next 1-3 weeks.  Please call us at [phone] if you have not heard about the biopsies in 3 weeks.    SIGNATURES/CONFIDENTIALITY: You and/or your care partner have signed paperwork which will be entered into your electronic medical record.  These signatures attest to the fact that that the information above on your After Visit Summary has been reviewed and is understood.  Full responsibility of the confidentiality of this discharge  information lies with you and/or your care-partner.

## 2019-05-14 ENCOUNTER — Other Ambulatory Visit: Payer: Self-pay

## 2019-05-14 ENCOUNTER — Telehealth: Payer: Self-pay | Admitting: *Deleted

## 2019-05-14 ENCOUNTER — Telehealth: Payer: Self-pay

## 2019-05-14 MED ORDER — MESALAMINE 1.2 G PO TBEC: 2.4000 g | DELAYED_RELEASE_TABLET | Freq: Two times a day (BID) | ORAL | 0 refills | Status: DC

## 2019-05-14 NOTE — Telephone Encounter (Signed)
See pathology result note. Barbera Setters will contact the patient.

## 2019-05-14 NOTE — Telephone Encounter (Signed)
1. Have you developed a fever since your procedure? no  2.   Have you had an respiratory symptoms (SOB or cough) since your procedure? no  3.   Have you tested positive for COVID 19 since your procedure no  4.   Have you had any family members/close contacts diagnosed with the COVID 19 since your procedure?  no   If yes to any of these questions please route to Joylene John, RN and Alphonsa Gin, Therapist, sports.  Follow up Call-  Call back number 05/12/2019  Post procedure Call Back phone  # [phone]  Permission to leave phone message Yes  Some recent data might be hidden     Patient questions:  Do you have a fever, pain , or abdominal swelling? No. Pain Score  0 *  Have you tolerated food without any problems? Yes.    Have you been able to return to your normal activities? Yes.    Do you have any questions about your discharge instructions: Diet   No. Medications  No. Follow up visit  No.  Do you have questions or concerns about your Care? No.  Actions: * If pain score is 4 or above: No action needed, pain <4.  Pt returned f/u call and mentioned he is having approximately a teaspoonful of  blood  Per rectum each time he goes th bathroom but that he was having bleeding that is why he had the Colonoscopy so he figured it was from his "diverticulitis". Pt also said his abdomen is sore . No fever or pain,just soreness" like has been doing sit ups" Instructed pt to call back if bleeding does not stop or soreness isn't improving . This message is routed to Dr. Fuller Plan

## 2019-05-14 NOTE — Telephone Encounter (Signed)
  Follow up Call-  Call back number 05/12/2019  Post procedure Call Back phone  # [phone]  Permission to leave phone message Yes  Some recent data might be hidden     Patient questions:  Do you have a fever, pain , or abdominal swelling? No. Pain Score  0 *  Have you tolerated food without any problems? Yes.    Have you been able to return to your normal activities? Yes.    Do you have any questions about your discharge instructions: Diet   No. Medications  No. Follow up visit  No.  Do you have questions or concerns about your Care? No.  Actions: * If pain score is 4 or above: No action needed, pain <4.   1. Have you developed a fever since your procedure? no  2.   Have you had an respiratory symptoms (SOB or cough) since your procedure? no  3.   Have you tested positive for COVID 19 since your procedure no  4.   Have you had any family members/close contacts diagnosed with the COVID 19 since your procedure?  no   If yes to any of these questions please route to Joylene John, RN and Alphonsa Gin, Therapist, sports.

## 2019-05-14 NOTE — Telephone Encounter (Signed)
First attempt follow up call to pt. Lm on vm

## 2019-05-15 NOTE — Telephone Encounter (Signed)
See the pathology report for additional details.

## 2019-06-02 ENCOUNTER — Encounter: Payer: Self-pay | Admitting: Gastroenterology

## 2019-06-02 ENCOUNTER — Ambulatory Visit: Payer: Medicare HMO | Admitting: Gastroenterology

## 2019-06-02 VITALS — BP 100/70 | HR 73 | Temp 98.1°F | Ht 67.0 in | Wt 155.0 lb

## 2019-06-02 DIAGNOSIS — Z86010 Personal history of colonic polyps: Secondary | ICD-10-CM | POA: Diagnosis not present

## 2019-06-02 DIAGNOSIS — K51011 Ulcerative (chronic) pancolitis with rectal bleeding: Secondary | ICD-10-CM | POA: Diagnosis not present

## 2019-06-02 MED ORDER — MESALAMINE 1.2 G PO TBEC: 2.4000 g | DELAYED_RELEASE_TABLET | Freq: Two times a day (BID) | ORAL | 6 refills | Status: DC

## 2019-06-02 NOTE — Progress Notes (Signed)
    History of Present Illness: This is a 59 year old male returning for follow-up of recently diagnosed ulcerative proctosigmoiditis.  He states his rectal bleeding has substantially improved.  His stool is formed and he notes a small amount of rectal bleeding occasionally.  He is pleased with his progress.  Colonoscopy  - Two 6 to 7 mm polyps in the rectum and in the transverse colon, removed with a cold snare. Resected and retrieved. - Moderate diverticulosis in the left colon. - Diffuse moderate inflammation was found in the rectum and in the sigmoid colon secondary to proctosigmoid colitis. Biopsied. - The examination was otherwise normal on direct and retroflexion views. - The examined portion of the ileum was normal  Path mildly active colitis and tubular adenomas  Current Medications, Allergies, Past Medical History, Past Surgical History, Family History and Social History were reviewed in [employer] record.   Physical Exam: General: Well developed, well nourished, no acute distress Head: Normocephalic and atraumatic Eyes:  sclerae anicteric, EOMI Ears: Normal auditory acuity Psychological:  Alert and cooperative. Normal mood and affect   Assessment and Recommendations:  1. Ulcerative proctosigmoiditis.  We reviewed current and long-term management plans for ulcerative colitis. Continue Lialda 2.4 po bid. he is advised to call if he has any worsening in symptoms or has questions.  REV in 6 months.   2.  Personal history of adenomatous colon polyps.  Polyp surveillance colonoscopy recommended in January 2027.

## 2019-06-02 NOTE — Patient Instructions (Signed)
If you are age 58 or older, your body mass index should be between 23-30. Your Body mass index is 24.28 kg/m. If this is out of the aforementioned range listed, please consider follow up with your Primary Care Provider.  If you are age 45 or younger, your body mass index should be between 19-25. Your Body mass index is 24.28 kg/m. If this is out of the aformentioned range listed, please consider follow up with your Primary Care Provider.   Continue your Lialda as directed.   Follow up in 6 months. Please contact the office to schedule your appointment.

## 2019-06-05 ENCOUNTER — Ambulatory Visit (INDEPENDENT_AMBULATORY_CARE_PROVIDER_SITE_OTHER): Payer: Medicare HMO | Admitting: Family Medicine

## 2019-06-05 ENCOUNTER — Encounter: Payer: Self-pay | Admitting: Family Medicine

## 2019-06-05 ENCOUNTER — Other Ambulatory Visit: Payer: Self-pay

## 2019-06-05 VITALS — BP 98/62 | HR 70 | Temp 96.8°F | Resp 18 | Ht 67.0 in | Wt 155.0 lb

## 2019-06-05 DIAGNOSIS — M722 Plantar fascial fibromatosis: Secondary | ICD-10-CM

## 2019-06-05 NOTE — Progress Notes (Signed)
Subjective:    Patient ID: Terry Guerrero, male    DOB: 1961/02/12, 59 y.o.   MRN: [id number]  HPI  Patient has pain for more than a week and his left heel.  The pain is located near the insertion of the plantar fascia on the plantar aspect of the calcaneus.  The patient states that it feels like someone is stabbing him with a knife whenever he tries to walk.  He has tried stretching the plantar fascia by walking on his toes with no improvement.  It hurts to touch him on the plantar aspect of the calcaneus.  There is no foreign body.  There is no penetrating trauma.  There is no swelling or erythema.  Patient denies any trauma or falls. Past Medical History:  Diagnosis Date  . Arthritis   . GERD (gastroesophageal reflux disease)   . Rheumatoid arthritis(714.0)   . Ulcerative colitis [facility])    Past Surgical History:  Procedure Laterality Date  . ANTERIOR CRUCIATE LIGAMENT REPAIR     Left   . COLONOSCOPY    . HERNIA REPAIR     Current Outpatient Medications on File Prior to Visit  Medication Sig Dispense Refill  . mesalamine (LIALDA) 1.2 g EC tablet Take 2 tablets (2.4 g total) by mouth 2 (two) times daily. 120 tablet 6   No current facility-administered medications on file prior to visit.   Allergies  Allergen Reactions  . Gold-Containing Drug Products   . Naprosyn [Naproxen]    Social History   Socioeconomic History  . Marital status: Divorced    Spouse name: Not on file  . Number of children: 1  . Years of education: Not on file  . Highest education level: Not on file  Occupational History  . Occupation: Disabled   Tobacco Use  . Smoking status: Current Every Day Smoker    Types: Cigarettes  . Smokeless tobacco: Never Used  Substance and Sexual Activity  . Alcohol use: Yes    Comment: occasional  . Drug use: Yes    Types: Marijuana    Comment: 0500 AM this morning 05/12/19  . Sexual activity: Not on file  Other Topics Concern  . Not on file  Social History  Narrative   Daily caffeine    Social Determinants of Health   Financial Resource Strain:   . Difficulty of Paying Living Expenses: Not on file  Food Insecurity:   . Worried About Charity fundraiser in the Last Year: Not on file  . Ran Out of Food in the Last Year: Not on file  Transportation Needs:   . Lack of Transportation (Medical): Not on file  . Lack of Transportation (Non-Medical): Not on file  Physical Activity:   . Days of Exercise per Week: Not on file  . Minutes of Exercise per Session: Not on file  Stress:   . Feeling of Stress : Not on file  Social Connections:   . Frequency of Communication with Friends and Family: Not on file  . Frequency of Social Gatherings with Friends and Family: Not on file  . Attends Religious Services: Not on file  . Active Member of Clubs or Organizations: Not on file  . Attends Archivist Meetings: Not on file  . Marital Status: Not on file  Intimate Partner Violence:   . Fear of Current or Ex-Partner: Not on file  . Emotionally Abused: Not on file  . Physically Abused: Not on file  . Sexually Abused:  Not on file     Review of Systems  All other systems reviewed and are negative.      Objective:   Physical Exam Vitals reviewed.  Constitutional:      Appearance: He is well-developed.  Cardiovascular:     Rate and Rhythm: Normal rate and regular rhythm.     Heart sounds: Normal heart sounds.  Pulmonary:     Effort: Pulmonary effort is normal. No respiratory distress.     Breath sounds: Normal breath sounds. No stridor. No wheezing.  Abdominal:     General: Abdomen is flat. Bowel sounds are normal. There is no distension.     Palpations: Abdomen is soft.     Tenderness: There is no abdominal tenderness. There is no guarding.  Genitourinary:    Rectum: Normal. No mass, tenderness, anal fissure, external hemorrhoid or internal hemorrhoid. Normal anal tone.  Musculoskeletal:       Feet:  Feet:     Left foot:      Skin integrity: Skin integrity normal.           Assessment & Plan:  Plantar fasciitis, left  Using sterile technique, I injected the insertion of the plantar fascia on the left calcaneus with 1 cc lidocaine, 1 cc of Marcaine, and 1 cc of 40 mg/mL Kenalog.  The patient tolerated the procedure well without complication.  We did discuss the risk and benefits of the cortisone injection prior and the patient agreed.

## 2019-08-05 ENCOUNTER — Telehealth: Payer: Self-pay | Admitting: Gastroenterology

## 2019-08-05 NOTE — Telephone Encounter (Signed)
Informed patient to contact his insurance company to to find out what his preferred mediation is with his insurance and let our office know. Informed patient that we can send in something more affordable after he gets that information for [place]. Patient verbalized understanding and will contact our office after finding out.

## 2019-08-05 NOTE — Telephone Encounter (Signed)
Left message for patient to return my call.

## 2019-08-07 ENCOUNTER — Other Ambulatory Visit: Payer: Self-pay

## 2019-08-07 ENCOUNTER — Telehealth: Payer: Self-pay | Admitting: Gastroenterology

## 2019-08-07 DIAGNOSIS — K51011 Ulcerative (chronic) pancolitis with rectal bleeding: Secondary | ICD-10-CM

## 2019-08-07 MED ORDER — SULFASALAZINE 500 MG PO TABS: ORAL_TABLET | ORAL | 1 refills | Status: DC

## 2019-08-07 NOTE — Telephone Encounter (Signed)
Azulfidine 500 mg po bid for 1 week and then 1000 mg po bid long term CBC in 3 weeks

## 2019-08-07 NOTE — Telephone Encounter (Addendum)
Patient states insurance will only cover azulfidine. Please advise Dr. Fuller Plan what dose to start patient on.

## 2019-08-07 NOTE — Telephone Encounter (Signed)
Informed patient to come into our lab in 3 weeks (around 5/7) to get labs. Order put in [place]. Also explained to patient on how to take azulfidine and a script sent to pharmacy.

## 2019-08-28 ENCOUNTER — Other Ambulatory Visit (INDEPENDENT_AMBULATORY_CARE_PROVIDER_SITE_OTHER): Payer: Medicare HMO

## 2019-08-28 DIAGNOSIS — K51011 Ulcerative (chronic) pancolitis with rectal bleeding: Secondary | ICD-10-CM

## 2019-08-28 LAB — CBC WITH DIFFERENTIAL/PLATELET
Basophils Absolute: 0.1 10*3/uL (ref 0.0–0.1)
Basophils Relative: 0.9 % (ref 0.0–3.0)
Eosinophils Absolute: 0.5 10*3/uL (ref 0.0–0.7)
Eosinophils Relative: 5.5 % — ABNORMAL HIGH (ref 0.0–5.0)
HCT: 42.5 % (ref 39.0–52.0)
Hemoglobin: 14.1 g/dL (ref 13.0–17.0)
Lymphocytes Relative: 28.1 % (ref 12.0–46.0)
Lymphs Abs: 2.8 10*3/uL (ref 0.7–4.0)
MCHC: 33.2 g/dL (ref 30.0–36.0)
MCV: 94.9 fl (ref 78.0–100.0)
Monocytes Absolute: 1 10*3/uL (ref 0.1–1.0)
Monocytes Relative: 10.3 % (ref 3.0–12.0)
Neutro Abs: 5.4 10*3/uL (ref 1.4–7.7)
Neutrophils Relative %: 55.2 % (ref 43.0–77.0)
Platelets: 367 10*3/uL (ref 150.0–400.0)
RBC: 4.48 Mil/uL (ref 4.22–5.81)
RDW: 14 % (ref 11.5–15.5)
WBC: 9.8 10*3/uL (ref 4.0–10.5)

## 2019-11-04 ENCOUNTER — Other Ambulatory Visit: Payer: Self-pay | Admitting: Gastroenterology

## 2019-11-04 ENCOUNTER — Telehealth: Payer: Self-pay | Admitting: Gastroenterology

## 2019-11-04 NOTE — Telephone Encounter (Signed)
Prescription sent to patient's pharmacy and patient notified.

## 2020-01-23 ENCOUNTER — Other Ambulatory Visit: Payer: Self-pay | Admitting: Gastroenterology

## 2020-02-03 ENCOUNTER — Telehealth: Payer: Self-pay | Admitting: Gastroenterology

## 2020-02-03 MED ORDER — SULFASALAZINE 500 MG PO TABS: ORAL_TABLET | ORAL | 1 refills | Status: DC

## 2020-02-03 NOTE — Telephone Encounter (Signed)
Sent prescription of sulfasalazine to his pharmacy per patient's request. Patient states he will call to make an appointment when he gets back in to town.

## 2020-03-22 ENCOUNTER — Encounter: Payer: Self-pay | Admitting: Gastroenterology

## 2020-03-22 ENCOUNTER — Ambulatory Visit: Payer: Medicare HMO | Admitting: Gastroenterology

## 2020-03-22 VITALS — BP 100/70 | HR 76 | Ht 67.0 in | Wt 142.0 lb

## 2020-03-22 DIAGNOSIS — K5100 Ulcerative (chronic) pancolitis without complications: Secondary | ICD-10-CM | POA: Diagnosis not present

## 2020-03-22 DIAGNOSIS — Z86010 Personal history of colonic polyps: Secondary | ICD-10-CM

## 2020-03-22 MED ORDER — SULFASALAZINE 500 MG PO TABS: ORAL_TABLET | ORAL | 11 refills | Status: DC

## 2020-03-22 NOTE — Progress Notes (Signed)
    History of Present Illness: This is a 59 year old with ulcerative pancolitis diagnosed in January 2021. Advised Lialda or another 5-ASA medication which were not covered so we changed to Azulfidine in April.  He is tolerating Azulfidine well.  He has no gastrointestinal complaints.  No diarrhea or rectal bleeding.  He feels well.    Current Medications, Allergies, Past Medical History, Past Surgical History, Family History and Social History were reviewed in [employer] record.   Physical Exam: General: Well developed, well nourished, no acute distress Head: Normocephalic and atraumatic Eyes:  sclerae anicteric, EOMI Ears: Normal auditory acuity Mouth: Not examined, mask on during Covid-19 pandemic Lungs: Clear throughout to auscultation Heart: Regular rate and rhythm; no murmurs, rubs or bruits Abdomen: Soft, non tender and non distended. No masses, hepatosplenomegaly or hernias noted. Normal Bowel sounds Rectal: Not done Musculoskeletal: Symmetrical with no gross deformities  Pulses:  Normal pulses noted Extremities: No clubbing, cyanosis, edema or deformities noted Neurological: Alert oriented x 4, grossly nonfocal Psychological:  Alert and cooperative. Normal mood and affect   Assessment and Recommendations:  1.  Ulcerative pancolitis.  Symptoms well controlled on Azulfidine 1 g p.o. twice daily.  Continue Azulfidine at current dose.  CBC and CMP at next visit.  Surveillance colonoscopy recommended in Jan 2027.  2.  Personal history of adenomatous colon polyps.  2 small tubular adenomas noted on colonoscopy.  Surveillance colonoscopy as above.

## 2020-03-22 NOTE — Patient Instructions (Signed)
We have sent the following medications to your pharmacy for you to pick up at your convenience: azulfidine.  Thank you for choosing me and [facility].  Pricilla Riffle. Dagoberto Ligas., MD., Marval Regal

## 2020-09-21 ENCOUNTER — Ambulatory Visit (INDEPENDENT_AMBULATORY_CARE_PROVIDER_SITE_OTHER): Payer: Medicare HMO | Admitting: Nurse Practitioner

## 2020-09-21 ENCOUNTER — Other Ambulatory Visit: Payer: Self-pay

## 2020-09-21 VITALS — BP 108/64 | HR 74 | Temp 98.5°F | Ht 67.0 in | Wt 138.0 lb

## 2020-09-21 DIAGNOSIS — S80862A Insect bite (nonvenomous), left lower leg, initial encounter: Secondary | ICD-10-CM

## 2020-09-21 DIAGNOSIS — S70369A Insect bite (nonvenomous), unspecified thigh, initial encounter: Secondary | ICD-10-CM | POA: Diagnosis not present

## 2020-09-21 DIAGNOSIS — S80861A Insect bite (nonvenomous), right lower leg, initial encounter: Secondary | ICD-10-CM

## 2020-09-21 DIAGNOSIS — M25562 Pain in left knee: Secondary | ICD-10-CM | POA: Diagnosis not present

## 2020-09-21 DIAGNOSIS — W57XXXA Bitten or stung by nonvenomous insect and other nonvenomous arthropods, initial encounter: Secondary | ICD-10-CM

## 2020-09-21 MED ORDER — ACETAMINOPHEN 500 MG PO TABS: 500.0000 mg | ORAL_TABLET | Freq: Four times a day (QID) | ORAL | 0 refills | Status: DC | PRN

## 2020-09-21 MED ORDER — DOXYCYCLINE HYCLATE 100 MG PO TABS: 100.0000 mg | ORAL_TABLET | Freq: Two times a day (BID) | ORAL | 0 refills | Status: DC

## 2020-09-21 NOTE — Progress Notes (Signed)
Subjective:    Patient ID: Terry Guerrero, male    DOB: 03-Jul-1960, 60 y.o.   MRN: [id number]  HPI: Terry Guerrero is a 60 y.o. male presenting for insect bite and knee pain.  Chief Complaint  Patient presents with  . Insect Bite    [employer], believes he has been bitten several times by ticks. Has bites and redness on several parts of the body. Bites are itchy, painful, has had chills for the past 2 days. Wants to be tested for [place] and lyme. Has lost loved ones in the past to those diseases.   TICK BITE Patient reports he went camping over the weekend and noticed multiple ticks "crawling all over" him.  He also noticed multiple bites after waking up in the morning.   Duration: week Location: legs - calves and things Onset: sudden Itching: yes Status: stable Treatments attempted: rubbing alcohol  Fever: no Chills: yes- last couple of days Night sweats: yes Headache: no Muscle pain: no  Joint pain: no Rash: no  KNEE PAIN Patient also reports knee pain started since standing and twisting the wrong way one time.   Duration: days Involved knee: left Mechanism of injury: trauma Location: medial Onset: sudden Severity:  3-4/10 Quality:  sharp Frequency: with activity Radiation: no Aggravating factors: standing, twisting foot walking Alleviating factors: ice, brace Status: stable Treatments attempted:  Ice, brace Relief with NSAIDs?:  No NSAIDs Taken Weakness with weight bearing or walking: yes Sensation of giving way: yes Locking: no Popping: yes Bruising: no Swelling: yes Redness: no Paresthesias/decreased sensation: no Fevers: no  Allergies  Allergen Reactions  . Gold-Containing Drug Products   . Naprosyn [Naproxen]     Outpatient Encounter Medications as of 09/21/2020  Medication Sig  . acetaminophen (TYLENOL) 500 MG tablet Take 1 tablet (500 mg total) by mouth every 6 (six) hours as needed for moderate pain.  Marland Kitchen doxycycline (VIBRA-TABS) 100 MG  tablet Take 1 tablet (100 mg total) by mouth 2 (two) times daily.  Marland Kitchen sulfaSALAzine (AZULFIDINE) 500 MG tablet TAKE 2 TABS BY MOUTH TWICE DAILY LONG TERM   No facility-administered encounter medications on file as of 09/21/2020.    Patient Active Problem List   Diagnosis Date Noted  . Rectal bleeding 05/07/2019  . Strain of left pectoralis muscle 02/13/2017  . Rheumatoid arthritis(714.0)     Past Medical History:  Diagnosis Date  . Arthritis   . GERD (gastroesophageal reflux disease)   . Rheumatoid arthritis(714.0)   . Ulcerative colitis ([place])    Relevant past medical, surgical, family and social history reviewed and updated as indicated. Interim medical history since our last visit reviewed.  Review of Systems Per HPI unless specifically indicated above     Objective:    BP 108/64   Pulse 74   Temp 98.5 F (36.9 C)   Ht 5' 7"  (1.702 m)   Wt 138 lb (62.6 kg)   SpO2 96%   BMI 21.61 kg/m   Wt Readings from Last 3 Encounters:  09/21/20 138 lb (62.6 kg)  03/22/20 142 lb (64.4 kg)  06/05/19 155 lb (70.3 kg)    Physical Exam Vitals and nursing note reviewed.  Constitutional:      General: He is not in acute distress.    Appearance: Normal appearance. He is not toxic-appearing.  HENT:     Head: Normocephalic and atraumatic.  Eyes:     General: No scleral icterus.    Extraocular Movements: Extraocular movements  intact.  Musculoskeletal:        General: Tenderness present. No swelling. Normal range of motion.     Cervical back: Normal range of motion and neck supple. No tenderness.     Right knee: Normal.     Left knee: Crepitus present. No swelling, effusion or erythema. Normal range of motion. Normal alignment, normal meniscus and normal patellar mobility.     Right lower leg: Normal. No edema.     Left lower leg: Normal. No edema.  Lymphadenopathy:     Cervical: No cervical adenopathy.  Skin:    General: Skin is warm and dry.     Capillary Refill: Capillary  refill takes less than 2 seconds.     Coloration: Skin is not jaundiced or pale.     Findings: Lesion present. No erythema.     Comments: Multiple scabbed over lesions scattered to bilateral lower extremities consistent with insect bites.  No surrounding erythema, bullseye rash, swelling, pain with palpation, fluctuance, warmth, or drainage.  Neurological:     General: No focal deficit present.     Mental Status: He is alert and oriented to person, place, and time.     Motor: No weakness.     Gait: Gait normal.  Psychiatric:        Attention and Perception: Attention and perception normal.        Mood and Affect: Affect normal. Mood is anxious.        Speech: Speech normal.        Behavior: Behavior normal. Behavior is cooperative.        Thought Content: Thought content normal.        Cognition and Memory: Cognition and memory normal.        Judgment: Judgment normal.       Assessment & Plan:  1. Tick bite of thigh, unspecified laterality, initial encounter Acute.  Worrisome that patient has been having chills, although most likely unrelated to tick bites, especially given that ticks were removed promptly.  Will check for RMSF and Lyme antibodies per patient request, although explained to patient that tick bite from over the weekend may not produce antibody response yet.  In meantime, will treat with doxycycline given multiple bites and chills.  Discussed ER precautions - headache, fever, chills, neck rigidity, nausea/vomiting, bullseye rash.  - doxycycline (VIBRA-TABS) 100 MG tablet; Take 1 tablet (100 mg total) by mouth 2 (two) times daily.  Dispense: 14 tablet; Refill: 0 - B. Burgdorfi Antibodies - Rickettsia Ab Pnl w/Rfx to Titer  2. Acute pain of left knee Acute.  Patient reports similar pain in the past and would like to talk with Orthopedic provider.  Discussed options with imaging, however elects to defer for now until he meets with Orthopedic provider.  Encouraged continued use  of knee brace, extra strength Tylenol, and rest.  If pain persists or worsens, discussed Orthopedic Walk-in clinic hours.    - acetaminophen (TYLENOL) 500 MG tablet; Take 1 tablet (500 mg total) by mouth every 6 (six) hours as needed for moderate pain.  Dispense: 30 tablet; Refill: 0 - Ambulatory referral to Orthopedics     Follow up plan: Return if symptoms worsen or fail to improve.

## 2020-09-23 LAB — B. BURGDORFI ANTIBODIES: B burgdorferi Ab IgG+IgM: <0.9 index

## 2020-09-23 LAB — RICKETTSIA AB PNL W/RFX TO TITER
RMSF IgG: NOT DETECTED
RMSF IgM: NOT DETECTED
Typhus IgG Antibodies: NOT DETECTED
Typhus IgM Antibodies: NOT DETECTED

## 2020-10-03 DIAGNOSIS — M25562 Pain in left knee: Secondary | ICD-10-CM | POA: Diagnosis not present

## 2020-10-03 DIAGNOSIS — M1712 Unilateral primary osteoarthritis, left knee: Secondary | ICD-10-CM | POA: Diagnosis not present

## 2020-10-03 DIAGNOSIS — S83512A Sprain of anterior cruciate ligament of left knee, initial encounter: Secondary | ICD-10-CM | POA: Diagnosis not present

## 2020-12-22 DIAGNOSIS — R569 Unspecified convulsions: Secondary | ICD-10-CM

## 2020-12-22 HISTORY — DX: Unspecified convulsions: R56.9

## 2021-01-03 ENCOUNTER — Ambulatory Visit (HOSPITAL_COMMUNITY)
Admission: RE | Admit: 2021-01-03 | Discharge: 2021-01-03 | Disposition: A | Discharge status: Home / Self Care | Payer: Medicare HMO | Source: Ambulatory Visit | Attending: Family Medicine | Admitting: Family Medicine

## 2021-01-03 ENCOUNTER — Ambulatory Visit (INDEPENDENT_AMBULATORY_CARE_PROVIDER_SITE_OTHER): Payer: Medicare HMO | Admitting: Family Medicine

## 2021-01-03 ENCOUNTER — Encounter: Payer: Self-pay | Admitting: Family Medicine

## 2021-01-03 ENCOUNTER — Other Ambulatory Visit: Payer: Self-pay

## 2021-01-03 VITALS — BP 106/64 | HR 84 | Ht 67.0 in | Wt 140.6 lb

## 2021-01-03 DIAGNOSIS — M5431 Sciatica, right side: Secondary | ICD-10-CM

## 2021-01-03 DIAGNOSIS — M5432 Sciatica, left side: Secondary | ICD-10-CM | POA: Diagnosis not present

## 2021-01-03 DIAGNOSIS — M545 Low back pain, unspecified: Secondary | ICD-10-CM | POA: Diagnosis not present

## 2021-01-03 IMAGING — CR DG LUMBAR SPINE COMPLETE 4+V
5 series · 5 of 5 positions shown · non-contrast
Comparison: None.

CLINICAL DATA: Low back pain for 3 days with radiation to legs.

EXAM:
LUMBAR SPINE - COMPLETE 4+ VIEW

[t  ap]
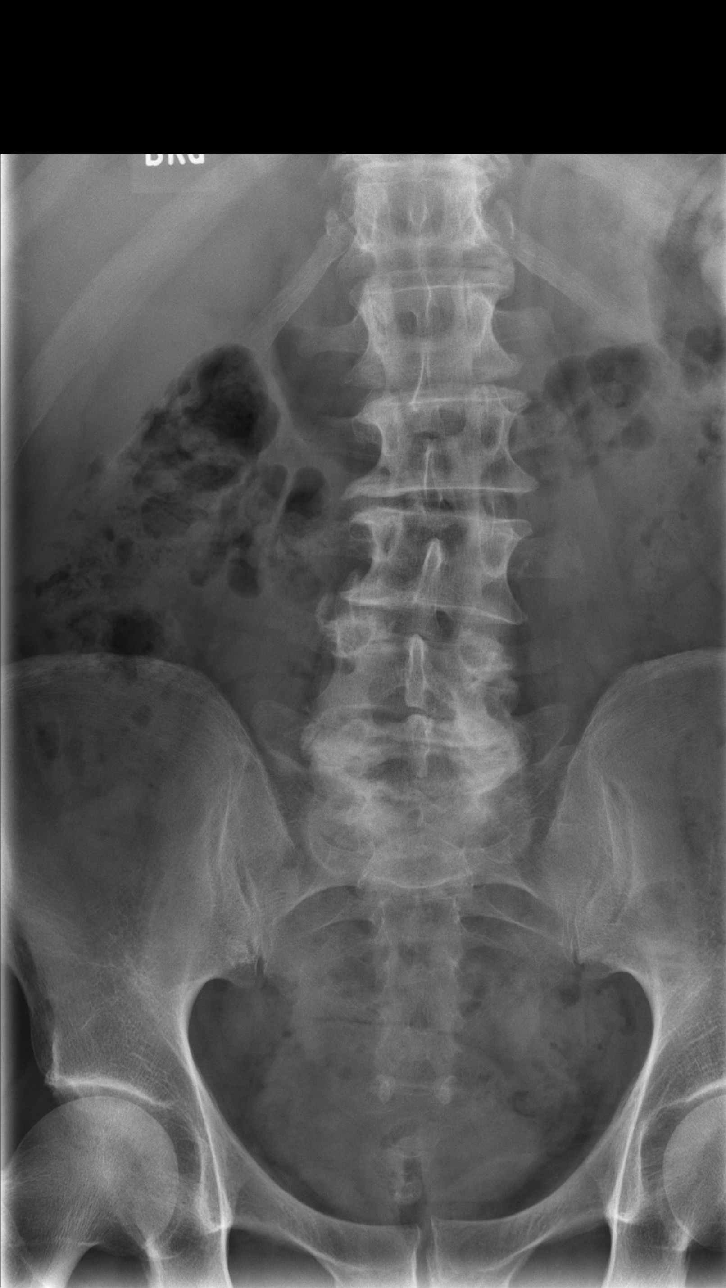

[t lpo (1 of 2)]
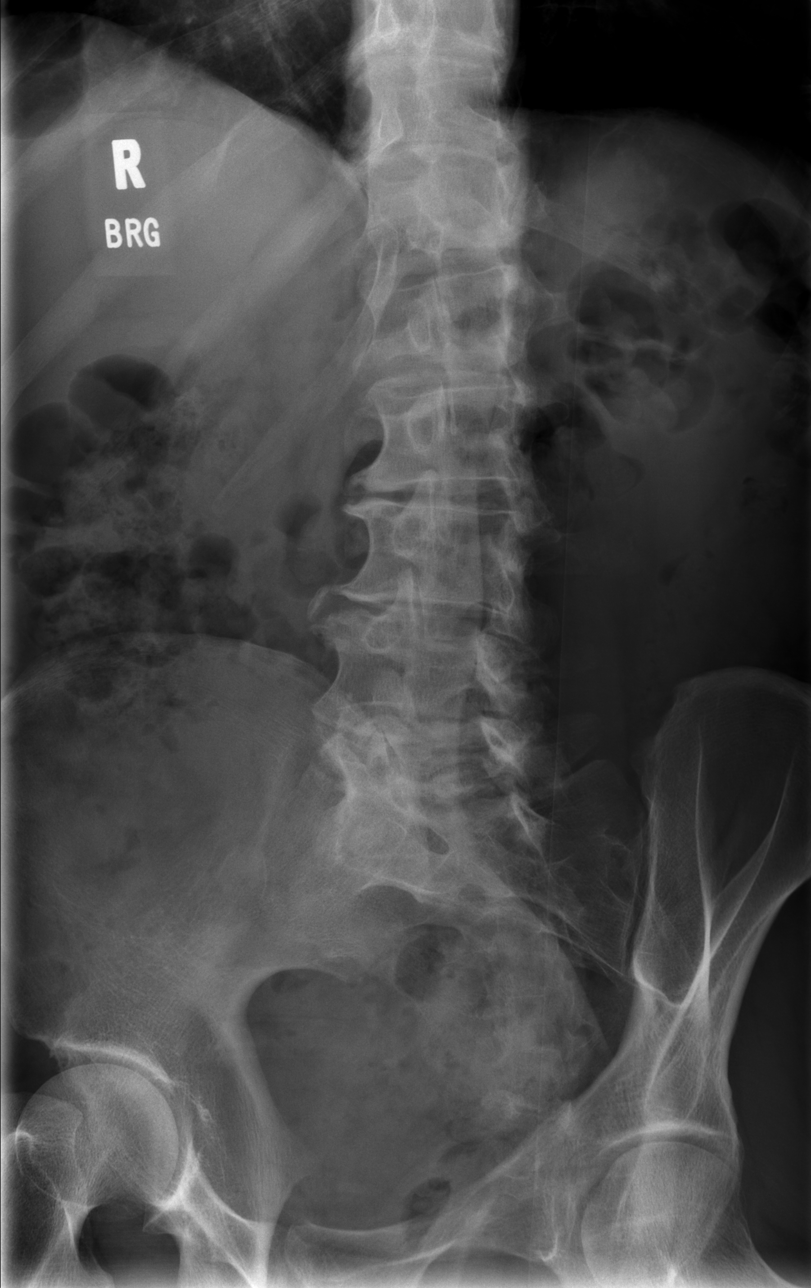

[t lpo (2 of 2)]
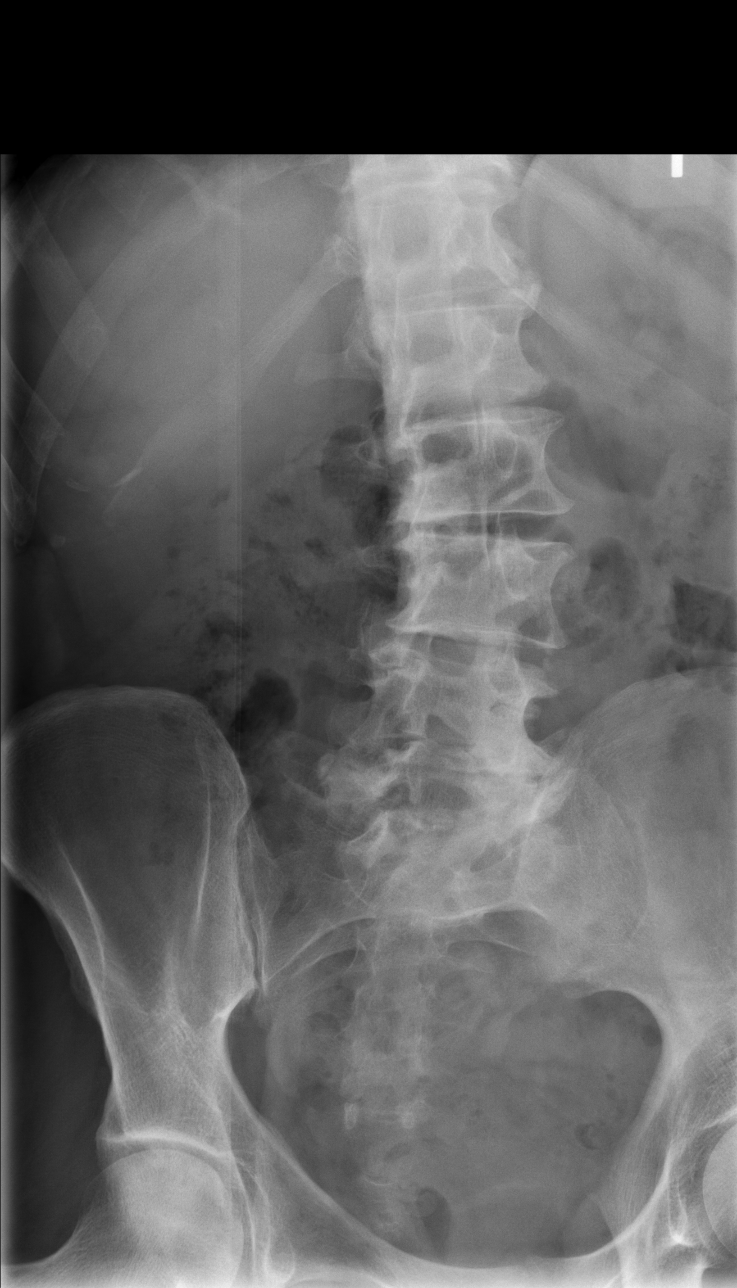

[t  lateral]
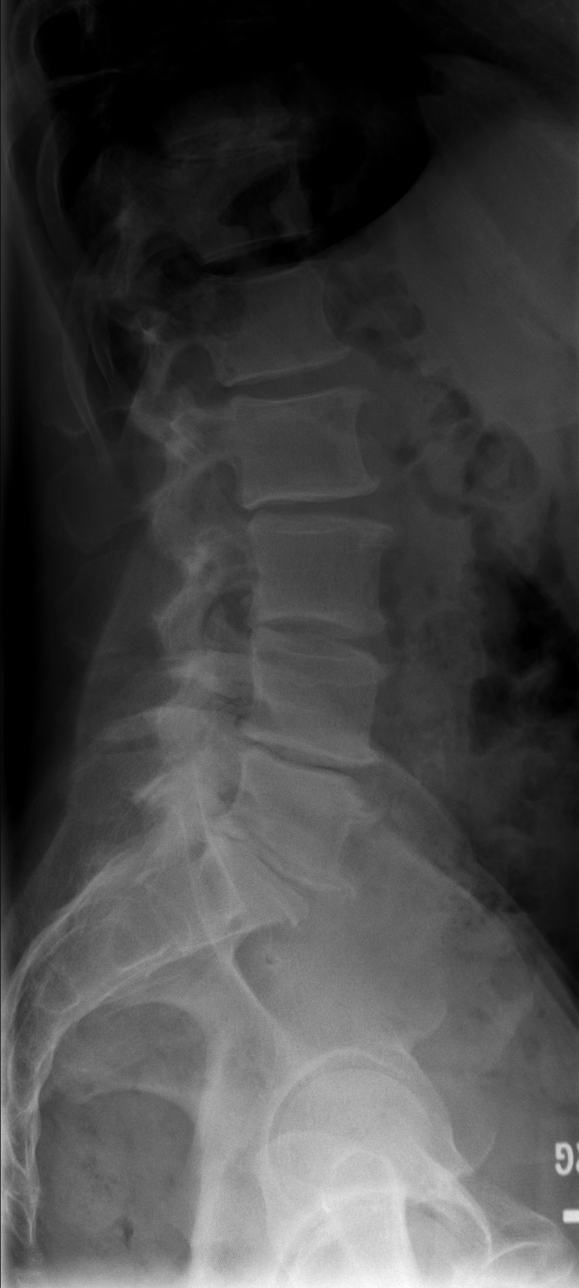

[t spot lateral]
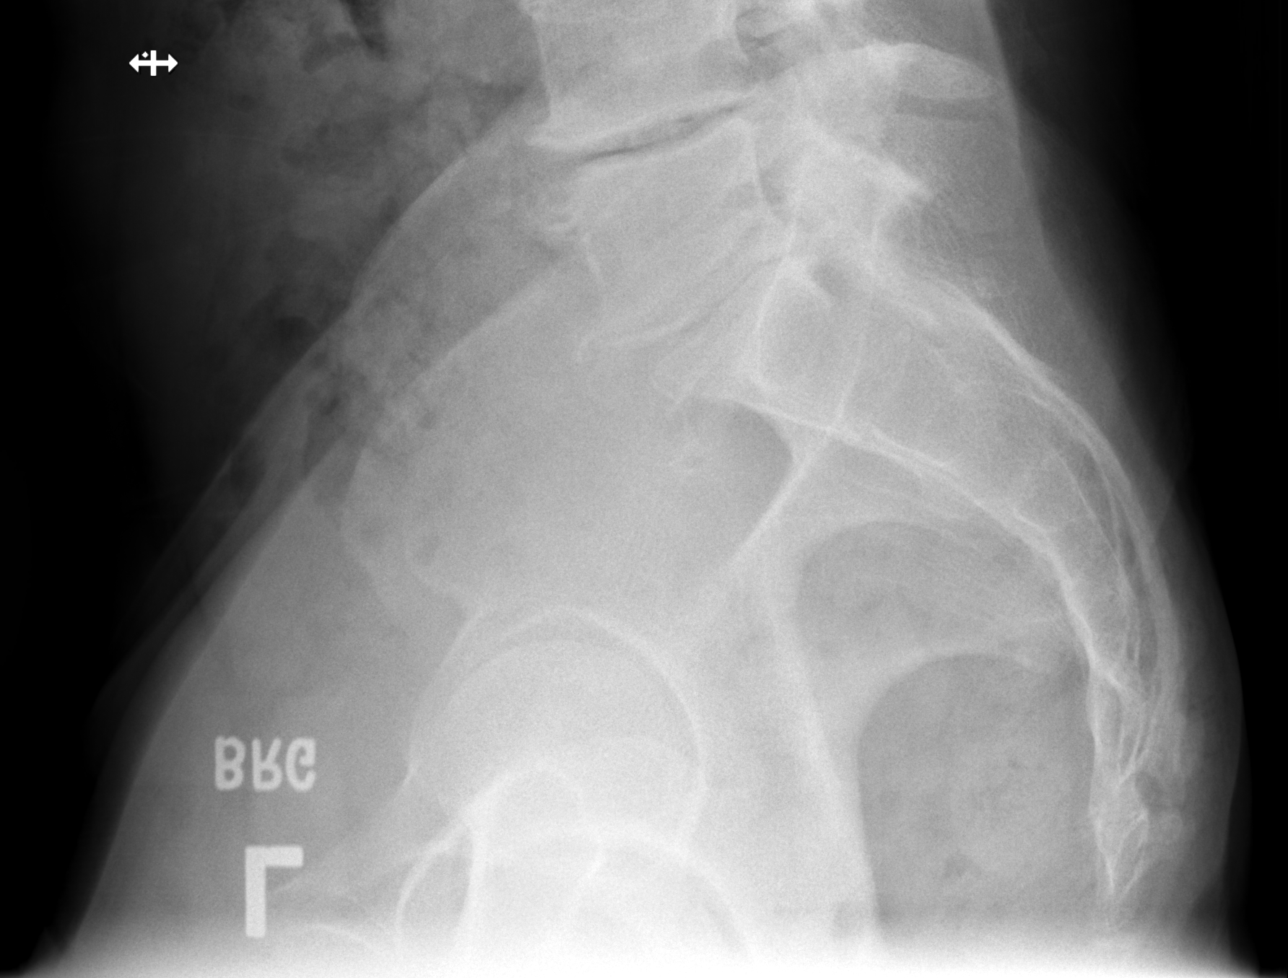

[5 of 5 positions shown; findings below may reference images not displayed]

FINDINGS: Five lumbar type vertebral bodies. Sacroiliac joints are symmetric.
Maintenance of vertebral body height and alignment. Loss of
intervertebral disc height at L5-S1, L4-5, and less so L3-4. Facet
arthropathy at L4-5 and L5-S1.
IMPRESSION: Lower lumbar spondylosis, without acute osseous abnormality.

## 2021-01-03 MED ORDER — PREDNISONE 20 MG PO TABS: ORAL_TABLET | ORAL | 0 refills | Status: DC

## 2021-01-03 MED ORDER — OXYCODONE-ACETAMINOPHEN 5-325 MG PO TABS: 1.0000 | ORAL_TABLET | ORAL | 0 refills | Status: DC | PRN

## 2021-01-03 NOTE — Progress Notes (Signed)
Subjective:    Patient ID: Terry Guerrero, male    DOB: 1961/01/30, 60 y.o.   MRN: [id number]  HPI Patient is a very pleasant 60 year old Caucasian gentleman with a longstanding history of rheumatoid arthritis and ulcerative colitis.  He has been on steroids for many years which puts him at higher risk for osteoporosis.  He states that Saturday, he was walking.  He took an awkward step with his right leg and developed sudden sharp searing pain in his lower back roughly at the level of L3.  He states that it doubled him over and he could barely stand.  Since that time he has had constant pain in his lower back around the level of L3 and the pain radiates down the posterior aspect of both legs to his knees.  He denies any saddle anesthesia or bowel or bladder incontinence.  He denies any leg weakness or leg numbness but he is having shooting nerve like pain going down the backs of both legs.  He is tender palpation in the lumbar spine over L2-L3 and L4.  He also has muscle spasms in the lumbar paraspinal muscles.  He has a negative straight leg raise bilaterally.  Muscle strength is 5/5 equal and symmetric in both legs.  He does have diminished reflexes secondary to pain I believe at the knee and at the ankle. Past Medical History:  Diagnosis Date   Arthritis    GERD (gastroesophageal reflux disease)    Rheumatoid arthritis(714.0)    Ulcerative colitis ([place])    Past Surgical History:  Procedure Laterality Date   ANTERIOR CRUCIATE LIGAMENT REPAIR     Left    COLONOSCOPY     HERNIA REPAIR     Current Outpatient Medications on File Prior to Visit  Medication Sig Dispense Refill   acetaminophen (TYLENOL) 500 MG tablet Take 1 tablet (500 mg total) by mouth every 6 (six) hours as needed for moderate pain. 30 tablet 0   sulfaSALAzine (AZULFIDINE) 500 MG tablet TAKE 2 TABS BY MOUTH TWICE DAILY LONG TERM 120 tablet 11   No current facility-administered medications on file prior to visit.   Allergies   Allergen Reactions   Gold-Containing Drug Products    Naprosyn [Naproxen]    Social History   Socioeconomic History   Marital status: Divorced    Spouse name: Not on file   Number of children: 1   Years of education: Not on file   Highest education level: Not on file  Occupational History   Occupation: Disabled   Tobacco Use   Smoking status: Every Day    Types: Cigarettes   Smokeless tobacco: Never  Vaping Use   Vaping Use: Never used  Substance and Sexual Activity   Alcohol use: Yes    Comment: occasional   Drug use: Yes    Types: Marijuana    Comment: 0500 AM this morning 05/12/19   Sexual activity: Not on file  Other Topics Concern   Not on file  Social History Narrative   Daily caffeine    Social Determinants of Health   Financial Resource Strain: Not on file  Food Insecurity: Not on file  Transportation Needs: Not on file  Physical Activity: Not on file  Stress: Not on file  Social Connections: Not on file  Intimate Partner Violence: Not on file     Review of Systems  All other systems reviewed and are negative.     Objective:   Physical Exam Vitals reviewed.  Constitutional:      Appearance: He is well-developed.  Cardiovascular:     Rate and Rhythm: Normal rate and regular rhythm.     Heart sounds: Normal heart sounds.  Pulmonary:     Effort: Pulmonary effort is normal. No respiratory distress.     Breath sounds: Normal breath sounds. No stridor. No wheezing.  Abdominal:     General: Abdomen is flat. Bowel sounds are normal. There is no distension.     Palpations: Abdomen is soft.     Tenderness: There is no abdominal tenderness. There is no guarding.  Genitourinary:    Rectum: Normal. No mass, tenderness, anal fissure, external hemorrhoid or internal hemorrhoid. Normal anal tone.  Musculoskeletal:     Lumbar back: Spasms, tenderness and bony tenderness present. Decreased range of motion. Negative right straight leg raise test and negative  left straight leg raise test.       Back:          Assessment & Plan:  Bilateral sciatica - Plan: DG Lumbar Spine Complete  Obtain a lumbar spine x-ray given the sudden onset of pain, longstanding steroid use, and the risk of osteoporosis to rule out a vertebral fracture.  However I suspect that the patient is likely herniated at this I will start the patient on a prednisone taper pack and also give the patient Percocet for pain.  Reassess in 1 week or sooner if worsening.  No evidence of cauda equina syndrome today on exam.

## 2021-01-11 ENCOUNTER — Telehealth: Payer: Self-pay

## 2021-01-11 NOTE — Telephone Encounter (Signed)
Per chart notes, PCP requested F/U if no improvement noted.   Call placed to patient and patient made aware. Appointment scheduled.

## 2021-01-11 NOTE — Telephone Encounter (Signed)
Pt called in stating that he was just recently seen at office for back pain. Pt states that the swelling has gone down but the pain has not gotten any better. Pt states that he would like to get a referral for an orthopedic if possible. Please advise.  Cb#: [phone]

## 2021-01-13 ENCOUNTER — Encounter: Payer: Self-pay | Admitting: Family Medicine

## 2021-01-13 ENCOUNTER — Ambulatory Visit (INDEPENDENT_AMBULATORY_CARE_PROVIDER_SITE_OTHER): Payer: Medicare HMO | Admitting: Family Medicine

## 2021-01-13 ENCOUNTER — Other Ambulatory Visit: Payer: Self-pay

## 2021-01-13 VITALS — BP 116/70 | HR 86 | Temp 98.1°F | Resp 16 | Ht 67.0 in | Wt 141.0 lb

## 2021-01-13 DIAGNOSIS — R55 Syncope and collapse: Secondary | ICD-10-CM | POA: Diagnosis not present

## 2021-01-13 DIAGNOSIS — M5441 Lumbago with sciatica, right side: Secondary | ICD-10-CM

## 2021-01-13 DIAGNOSIS — M5442 Lumbago with sciatica, left side: Secondary | ICD-10-CM | POA: Diagnosis not present

## 2021-01-13 MED ORDER — PREDNISONE 20 MG PO TABS: ORAL_TABLET | ORAL | 0 refills | Status: DC

## 2021-01-13 MED ORDER — OXYCODONE-ACETAMINOPHEN 5-325 MG PO TABS: 1.0000 | ORAL_TABLET | ORAL | 0 refills | Status: DC | PRN

## 2021-01-13 NOTE — Progress Notes (Signed)
Subjective:    Patient ID: Terry Guerrero, male    DOB: 01-16-1961, 60 y.o.   MRN: [id number]  HPI 01/03/21 Patient is a very pleasant 60 year old Caucasian gentleman with a longstanding history of rheumatoid arthritis and ulcerative colitis.  He has been on steroids for many years which puts him at higher risk for osteoporosis.  He states that Saturday, he was walking.  He took an awkward step with his right leg and developed sudden sharp searing pain in his lower back roughly at the level of L3.  He states that it doubled him over and he could barely stand.  Since that time he has had constant pain in his lower back around the level of L3 and the pain radiates down the posterior aspect of both legs to his knees.  He denies any saddle anesthesia or bowel or bladder incontinence.  He denies any leg weakness or leg numbness but he is having shooting nerve like pain going down the backs of both legs.  He is tender palpation in the lumbar spine over L2-L3 and L4.  He also has muscle spasms in the lumbar paraspinal muscles.  He has a negative straight leg raise bilaterally.  Muscle strength is 5/5 equal and symmetric in both legs.  He does have diminished reflexes secondary to pain I believe at the knee and at the ankle.  At that time, my plan was:   Obtain a lumbar spine x-ray given the sudden onset of pain, longstanding steroid use, and the risk of osteoporosis to rule out a vertebral fracture.  However I suspect that the patient is likely herniated at this I will start the patient on a prednisone taper pack and also give the patient Percocet for pain.  Reassess in 1 week or sooner if worsening.  No evidence of cauda equina syndrome today on exam.  01/13/21 Patient states that the pain is no better.  If anything gets worse.  He continues to have severe sharp stabbing pain in his lower back roughly around the level of L3-L4 and L5.  On x-ray he has scoliosis with a wedge-shaped deformity most pronounced at  L3.  He continues to have bilateral sciatica.  The pain is so intense that he has passed out on 2 different occasions!  At 1 point he was standing at his stove cooking dinner when he felt severe pain in his back.  He became extremely hot and flushed and then passed out.  The second time, he was sitting in his truck driving in his driveway.  The pain was intense and he also lost consciousness.  In both situations, he denies any chest pain shortness of breath or dyspnea on exertion.  He denies any palpitations.  EKG today shows normal sinus rhythm with a first-degree AV block but no evidence of ischemia or infarction.  QT interval is normal.  There is no evidence of Brugada syndrome or Wolff-Parkinson-White. Past Medical History:  Diagnosis Date   Arthritis    GERD (gastroesophageal reflux disease)    Rheumatoid arthritis(714.0)    Ulcerative colitis ([place])    Past Surgical History:  Procedure Laterality Date   ANTERIOR CRUCIATE LIGAMENT REPAIR     Left    COLONOSCOPY     HERNIA REPAIR     Current Outpatient Medications on File Prior to Visit  Medication Sig Dispense Refill   acetaminophen (TYLENOL) 500 MG tablet Take 1 tablet (500 mg total) by mouth every 6 (six) hours as needed for moderate  pain. 30 tablet 0   sulfaSALAzine (AZULFIDINE) 500 MG tablet TAKE 2 TABS BY MOUTH TWICE DAILY LONG TERM 120 tablet 11   No current facility-administered medications on file prior to visit.   Allergies  Allergen Reactions   Gold-Containing Drug Products    Naprosyn [Naproxen]    Social History   Socioeconomic History   Marital status: Divorced    Spouse name: Not on file   Number of children: 1   Years of education: Not on file   Highest education level: Not on file  Occupational History   Occupation: Disabled   Tobacco Use   Smoking status: Every Day    Types: Cigarettes   Smokeless tobacco: Never  Vaping Use   Vaping Use: Never used  Substance and Sexual Activity   Alcohol use: Yes     Comment: occasional   Drug use: Yes    Types: Marijuana    Comment: 0500 AM this morning 05/12/19   Sexual activity: Not on file  Other Topics Concern   Not on file  Social History Narrative   Daily caffeine    Social Determinants of Health   Financial Resource Strain: Not on file  Food Insecurity: Not on file  Transportation Needs: Not on file  Physical Activity: Not on file  Stress: Not on file  Social Connections: Not on file  Intimate Partner Violence: Not on file     Review of Systems  All other systems reviewed and are negative.     Objective:   Physical Exam Vitals reviewed.  Constitutional:      Appearance: He is well-developed.  Cardiovascular:     Rate and Rhythm: Normal rate and regular rhythm.     Heart sounds: Normal heart sounds.  Pulmonary:     Effort: Pulmonary effort is normal. No respiratory distress.     Breath sounds: Normal breath sounds. No stridor. No wheezing.  Abdominal:     General: Abdomen is flat. Bowel sounds are normal. There is no distension.     Palpations: Abdomen is soft.     Tenderness: There is no abdominal tenderness. There is no guarding.  Genitourinary:    Rectum: Normal. No mass, tenderness, anal fissure, external hemorrhoid or internal hemorrhoid. Normal anal tone.  Musculoskeletal:     Lumbar back: Spasms, tenderness and bony tenderness present. Decreased range of motion. Negative right straight leg raise test and negative left straight leg raise test.       Back:          Assessment & Plan:  Syncope and collapse - Plan: EKG 12-Lead, CBC with Differential/Platelet, COMPLETE METABOLIC PANEL WITH GFR, Ambulatory referral to Cardiology  Low back pain due to bilateral sciatica - Plan: MR Lumbar Spine Wo Contrast  I believe the syncope is likely a vasovagal reaction brought on by severe pain.  However I am going to consult cardiology and also check a CBC to rule out anemia and a CMP to rule out any electrolyte disturbances.   The back pain is intense and severe even to the point that the patient's passing out due to the pain.  Therefore I am going to proceed with an MRI to see if there is any other potential explanations tickly given his increased fracture risk due to his longstanding use of steroids.  Gave the patient an additional prednisone taper pack and also gave him Percocet 5/325 1 p.o. every 6 hours as needed pain

## 2021-01-14 LAB — COMPLETE METABOLIC PANEL WITH GFR
AG Ratio: 1.6 (calc) (ref 1.0–2.5)
ALT: 9 U/L (ref 9–46)
AST: 12 U/L (ref 10–35)
Albumin: 4.1 g/dL (ref 3.6–5.1)
Alkaline phosphatase (APISO): 60 U/L (ref 35–144)
BUN: 12 mg/dL (ref 7–25)
CO2: 26 mmol/L (ref 20–32)
Calcium: 9.4 mg/dL (ref 8.6–10.3)
Chloride: 106 mmol/L (ref 98–110)
Creat: 0.84 mg/dL (ref 0.70–1.30)
Globulin: 2.5 g/dL (calc) (ref 1.9–3.7)
Glucose, Bld: 90 mg/dL (ref 65–99)
Potassium: 4.4 mmol/L (ref 3.5–5.3)
Sodium: 138 mmol/L (ref 135–146)
Total Bilirubin: 0.3 mg/dL (ref 0.2–1.2)
Total Protein: 6.6 g/dL (ref 6.1–8.1)
eGFR: 100 mL/min/{1.73_m2} (ref >=60)

## 2021-01-14 LAB — CBC WITH DIFFERENTIAL/PLATELET
Absolute Monocytes: 1224 cells/uL — ABNORMAL HIGH (ref 200–950)
Basophils Absolute: 84 cells/uL (ref 0–200)
Basophils Relative: 0.7 %
Eosinophils Absolute: 528 cells/uL — ABNORMAL HIGH (ref 15–500)
Eosinophils Relative: 4.4 %
HCT: 43.7 % (ref 38.5–50.0)
Hemoglobin: 14.4 g/dL (ref 13.2–17.1)
Lymphs Abs: 3108 cells/uL (ref 850–3900)
MCH: 32.9 pg (ref 27.0–33.0)
MCHC: 33 g/dL (ref 32.0–36.0)
MCV: 99.8 fL (ref 80.0–100.0)
MPV: 10.3 fL (ref 7.5–12.5)
Monocytes Relative: 10.2 %
Neutro Abs: 7056 cells/uL (ref 1500–7800)
Neutrophils Relative %: 58.8 %
Platelets: 340 10*3/uL (ref 140–400)
RBC: 4.38 10*6/uL (ref 4.20–5.80)
RDW: 12.6 % (ref 11.0–15.0)
Total Lymphocyte: 25.9 %
WBC: 12 10*3/uL — ABNORMAL HIGH (ref 3.8–10.8)

## 2021-01-21 ENCOUNTER — Encounter (HOSPITAL_COMMUNITY): Payer: Self-pay | Admitting: Emergency Medicine

## 2021-01-21 ENCOUNTER — Emergency Department (HOSPITAL_COMMUNITY)
Admission: EM | Admit: 2021-01-21 | Discharge: 2021-01-21 | Disposition: A | Discharge status: Home / Self Care | Payer: Medicare HMO | Attending: Emergency Medicine | Admitting: Emergency Medicine

## 2021-01-21 ENCOUNTER — Other Ambulatory Visit: Payer: Self-pay

## 2021-01-21 ENCOUNTER — Emergency Department (HOSPITAL_COMMUNITY): Payer: Medicare HMO

## 2021-01-21 DIAGNOSIS — R69 Illness, unspecified: Secondary | ICD-10-CM | POA: Diagnosis not present

## 2021-01-21 DIAGNOSIS — R569 Unspecified convulsions: Secondary | ICD-10-CM | POA: Diagnosis not present

## 2021-01-21 DIAGNOSIS — G40909 Epilepsy, unspecified, not intractable, without status epilepticus: Secondary | ICD-10-CM | POA: Diagnosis not present

## 2021-01-21 DIAGNOSIS — F17210 Nicotine dependence, cigarettes, uncomplicated: Secondary | ICD-10-CM | POA: Insufficient documentation

## 2021-01-21 DIAGNOSIS — R4020 Unspecified coma: Secondary | ICD-10-CM | POA: Diagnosis not present

## 2021-01-21 LAB — COMPREHENSIVE METABOLIC PANEL
ALT: 12 U/L (ref 0–44)
AST: 18 U/L (ref 15–41)
Albumin: 3.8 g/dL (ref 3.5–5.0)
Alkaline Phosphatase: 55 U/L (ref 38–126)
Anion gap: 8 (ref 5–15)
BUN: 10 mg/dL (ref 6–20)
CO2: 26 mmol/L (ref 22–32)
Calcium: 9 mg/dL (ref 8.9–10.3)
Chloride: 101 mmol/L (ref 98–111)
Creatinine, Ser: 0.78 mg/dL (ref 0.61–1.24)
GFR, Estimated: >60 mL/min (ref >60)
Glucose, Bld: 111 mg/dL — ABNORMAL HIGH (ref 70–99)
Potassium: 4.2 mmol/L (ref 3.5–5.1)
Sodium: 135 mmol/L (ref 135–145)
Total Bilirubin: 0.8 mg/dL (ref 0.3–1.2)
Total Protein: 6.6 g/dL (ref 6.5–8.1)

## 2021-01-21 LAB — CBC
HCT: 41.7 % (ref 39.0–52.0)
Hemoglobin: 13.4 g/dL (ref 13.0–17.0)
MCH: 32.2 pg (ref 26.0–34.0)
MCHC: 32.1 g/dL (ref 30.0–36.0)
MCV: 100.2 fL — ABNORMAL HIGH (ref 80.0–100.0)
Platelets: 321 10*3/uL (ref 150–400)
RBC: 4.16 MIL/uL — ABNORMAL LOW (ref 4.22–5.81)
RDW: 14.5 % (ref 11.5–15.5)
WBC: 15.2 10*3/uL — ABNORMAL HIGH (ref 4.0–10.5)
nRBC: 0 % (ref 0.0–0.2)

## 2021-01-21 LAB — CBG MONITORING, ED: Glucose-Capillary: 97 mg/dL (ref 70–99)

## 2021-01-21 IMAGING — CT CT HEAD W/O CM
4 series · 16 of 47 positions shown, 18 images · non-contrast
Comparison: None.

CLINICAL DATA: Seizure.

EXAM:
CT HEAD WITHOUT CONTRAST
TECHNIQUE: Contiguous axial images were obtained from the base of the skull
through the vertex without intravenous contrast.

[Series 3: head wo · axial · 0.46mm/px · z∈[-76,+34]mm · 6 of 32 slices shown, 8 images]
[im 5/32  brain]
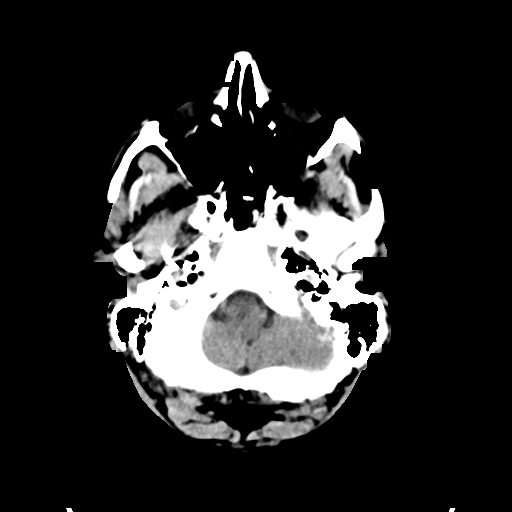
[im 5/32  bone]
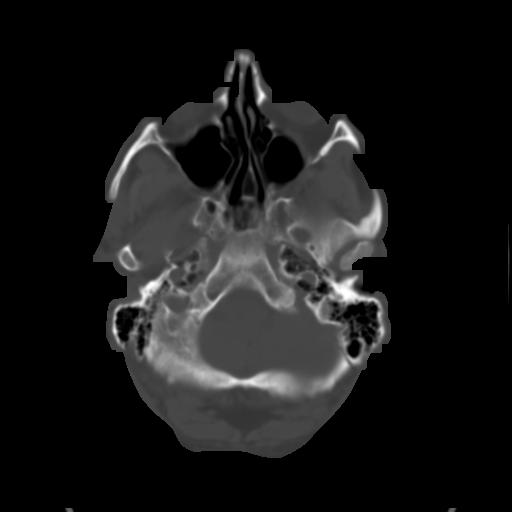
[im 9/32  brain]
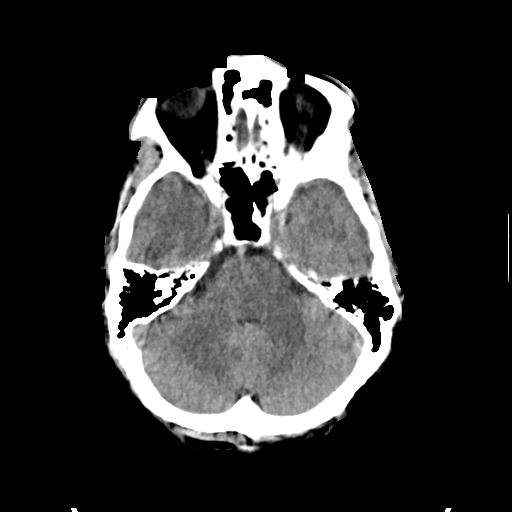
[im 14/32  brain]
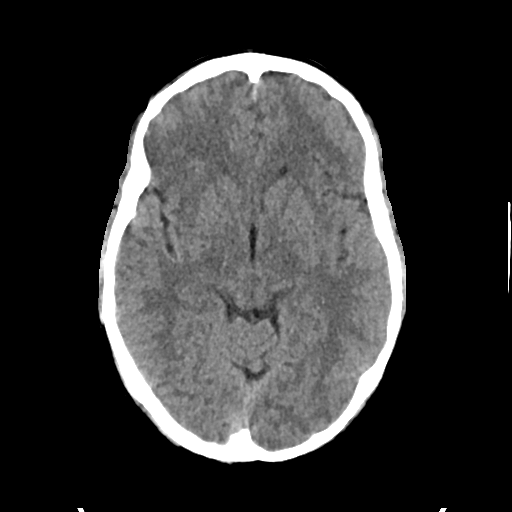
[im 18/32  brain]
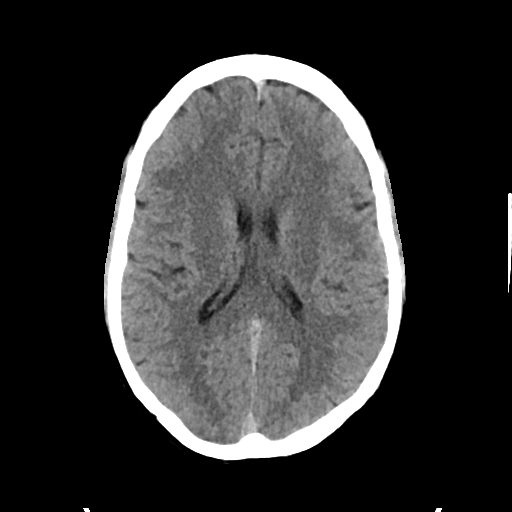
[im 23/32  brain]
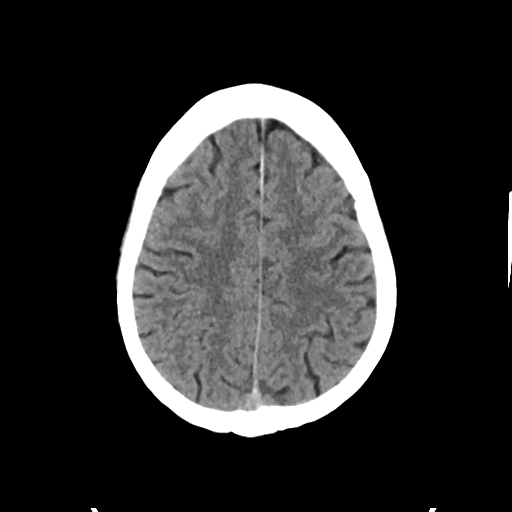
[im 23/32  bone]
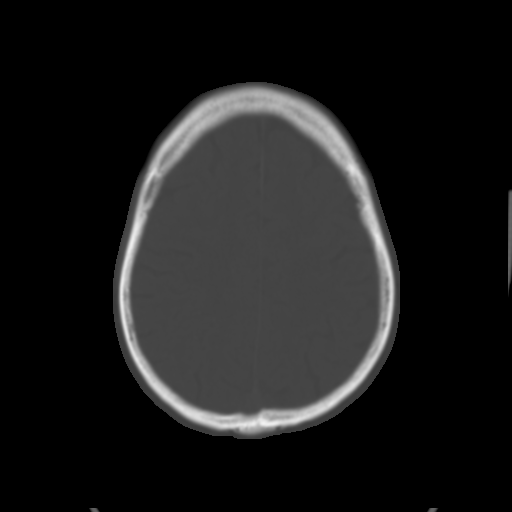
[im 27/32  brain]
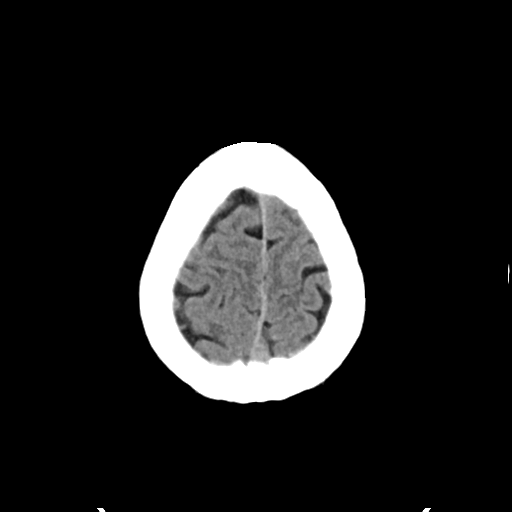

[Series 4: head bone · axial · 0.46mm/px · z∈[-82,-28]mm · 4 of 82 slices shown]
[im 8/82  bone]
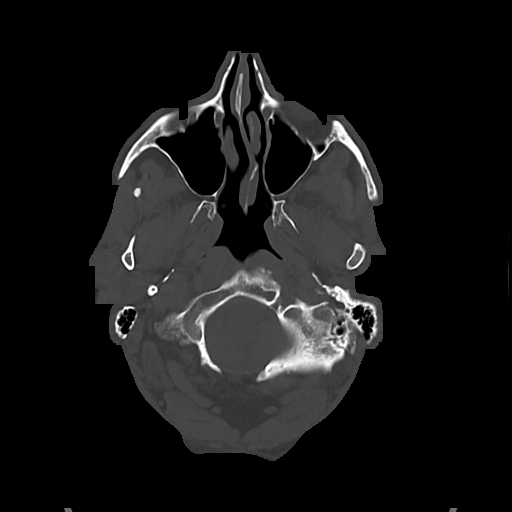
[im 16/82  bone]
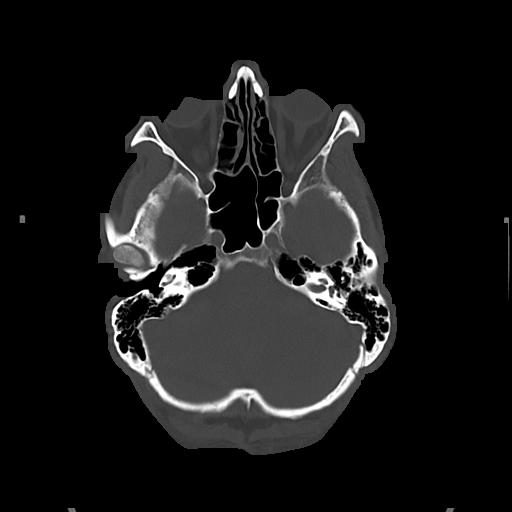
[im 28/82  bone]
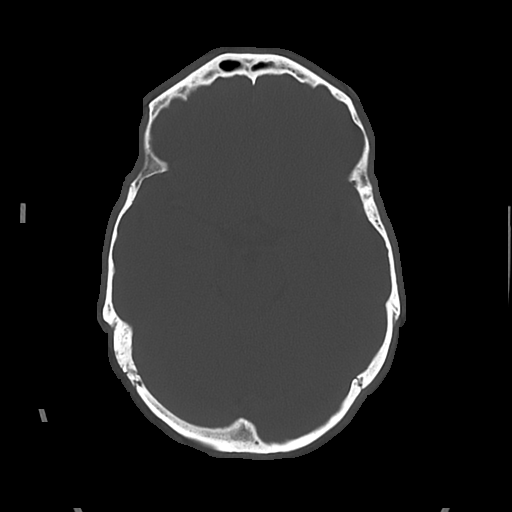
[im 35/82  bone]
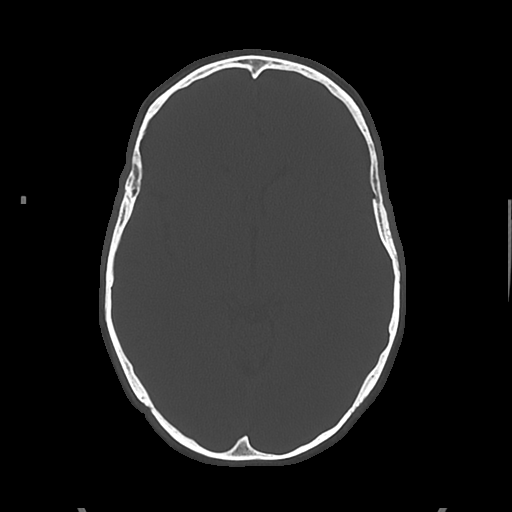

[Series 5: sag soft · sagittal · 0.31mm/px · 3 of 50 slices shown]
[im 17/50  brain]
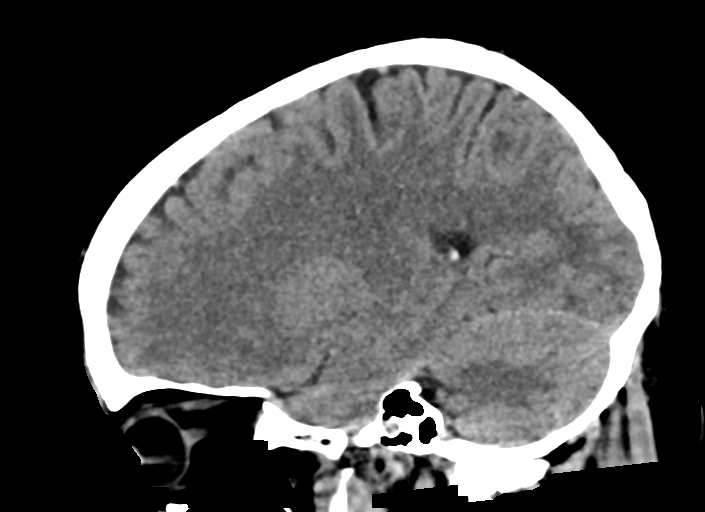
[im 25/50  brain]
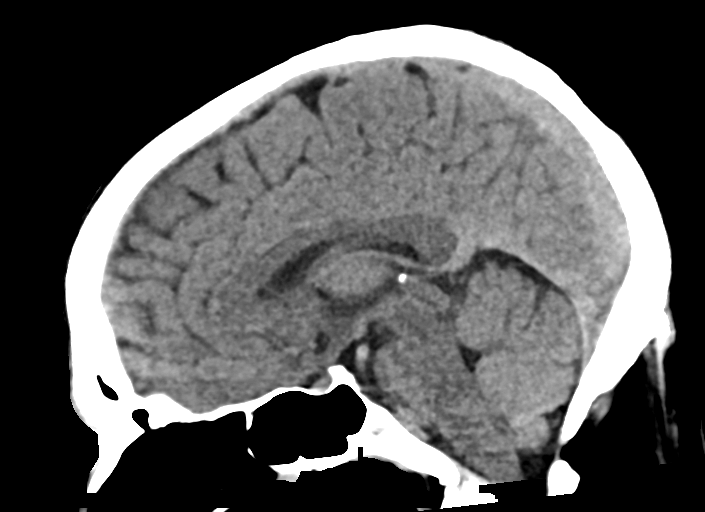
[im 33/50  brain]
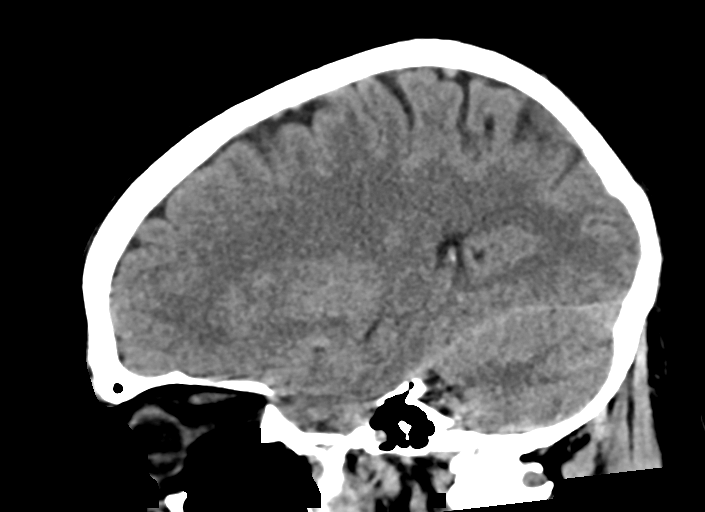

[Series 6: cor soft · coronal · 0.31mm/px · 3 of 69 slices shown]
[im 23/69  brain]
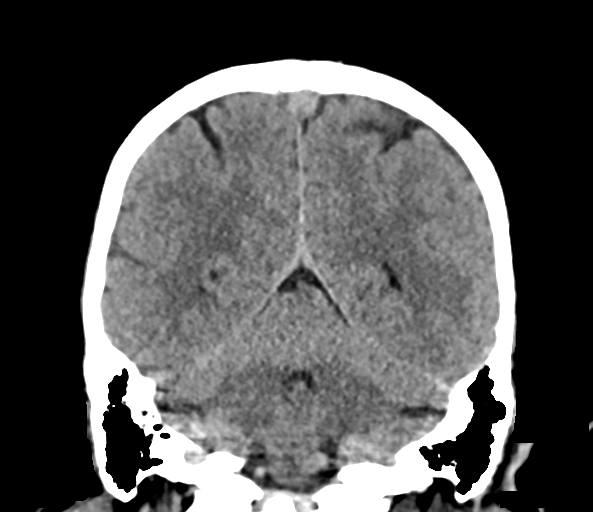
[im 31/69  brain]
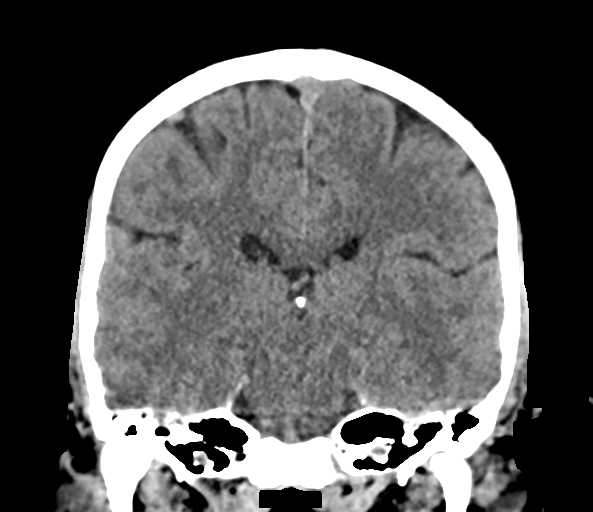
[im 38/69  brain]
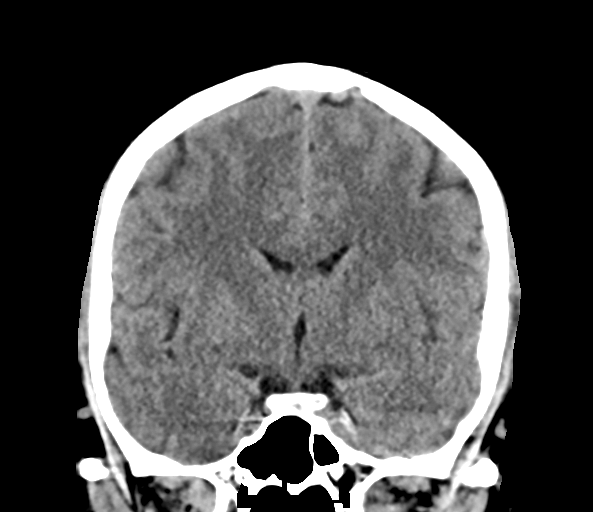

[16 of 47 positions shown; findings below may reference images not displayed]

FINDINGS: Brain: Ventricles and sulci are appropriate for patient's age. No
evidence for acute cortically based infarct, intracranial
hemorrhage, mass lesion or mass-effect.

Vascular: Unremarkable

Skull: Intact.

Sinuses/Orbits: Paranasal sinuses are well aerated. Mastoid air
cells are unremarkable.

Other: None.
IMPRESSION: No acute intracranial process.

## 2021-01-21 MED ORDER — LEVETIRACETAM 500 MG PO TABS: 500.0000 mg | ORAL_TABLET | Freq: Two times a day (BID) | ORAL | 0 refills | Status: DC

## 2021-01-21 MED ORDER — LEVETIRACETAM IN NACL 1000 MG/100ML IV SOLN
1000.0000 mg | Freq: Once | INTRAVENOUS | Status: AC
  Administered 2021-01-21: 1000 mg via INTRAVENOUS
  Filled 2021-01-21: qty 100

## 2021-01-21 MED ORDER — SODIUM CHLORIDE 0.9 % IV BOLUS
500.0000 mL | Freq: Once | INTRAVENOUS | Status: AC
  Administered 2021-01-21: 500 mL via INTRAVENOUS

## 2021-01-21 NOTE — ED Notes (Signed)
Received verbal report from Alana B RN at this time °

## 2021-01-21 NOTE — ED Notes (Signed)
Provider at bedside

## 2021-01-21 NOTE — Discharge Instructions (Addendum)
You are seen in the ER for seizure-like activity.  We will be starting you on a medication for called Keppra for epilepsy.  We would like you to be seen by neurologist for definitive diagnosis and appropriate outpatient management of this condition.  [place] law prevents people with seizures or fainting from driving or operating dangerous machinery until they are free of seizures or fainting for 6 months.

## 2021-01-21 NOTE — ED Triage Notes (Signed)
Pt bib [facility] for seizures. Pt was at home laying in the recliner when his girlfriend notice him stop talking and looking into space. Girlfriend told EMS patient had tonic-clonic activity for 2 minutes and was post-ictal for 20 mins. Pt is now A&Ox4. Pt has no hx of seizures. No ETOH use reported.  EMS vitals BP 130/70 HR 82 RR 18 SpO2 98 CBG 82

## 2021-01-21 NOTE — ED Provider Notes (Signed)
[facility] EMERGENCY DEPARTMENT Provider Note   CSN: [id number] Arrival date & time: 01/21/21  1731     History Chief Complaint  Patient presents with   Seizures    Terry Guerrero is a 60 y.o. male.  HPI    Pt comes in with cc of seizures. Pt has hx of RA, UC and reports that last thing he recalls is watching TV at his home. Thereafter he woke up and was being loaded onto his truck. No aura preceding.   No hx of TBI, pt did have a seizure about a month ago at his home. Pt didn't get any medical care at that time.   Pt smokes a PPD since he was 63. He drinks occasionally. He uses marijuana - regularly.   No pain at this time. Past Medical History:  Diagnosis Date   Arthritis    GERD (gastroesophageal reflux disease)    Rheumatoid arthritis(714.0)    Ulcerative colitis ([place])     Patient Active Problem List   Diagnosis Date Noted   Rectal bleeding 05/07/2019   Strain of left pectoralis muscle 02/13/2017   Rheumatoid arthritis(714.0)     Past Surgical History:  Procedure Laterality Date   ANTERIOR CRUCIATE LIGAMENT REPAIR     Left    COLONOSCOPY     HERNIA REPAIR         Family History  Problem Relation Age of Onset   Alcoholism Mother    Colon cancer Neg Hx    Esophageal cancer Neg Hx    Stomach cancer Neg Hx    Rectal cancer Neg Hx     Social History   Tobacco Use   Smoking status: Every Day    Types: Cigarettes   Smokeless tobacco: Never  Vaping Use   Vaping Use: Never used  Substance Use Topics   Alcohol use: Yes    Comment: occasional   Drug use: Yes    Types: Marijuana    Comment: yesterday 01/20/21    Home Medications Prior to Admission medications   Medication Sig Start Date End Date Taking? Authorizing Provider  levETIRAcetam (KEPPRA) 500 MG tablet Take 1 tablet (500 mg total) by mouth 2 (two) times daily. 01/21/21  Yes Varney Biles, MD  acetaminophen (TYLENOL) 500 MG tablet Take 1 tablet (500 mg total) by mouth  every 6 (six) hours as needed for moderate pain. 09/21/20   Eulogio Bear, NP  oxyCODONE-acetaminophen (PERCOCET/ROXICET) 5-325 MG tablet Take 1 tablet by mouth every 4 (four) hours as needed for severe pain. 01/13/21   Susy Frizzle, MD  predniSONE (DELTASONE) 20 MG tablet 3 tabs poqday 1-2, 2 tabs poqday 3-4, 1 tab poqday 5-6 01/13/21   Susy Frizzle, MD  sulfaSALAzine (AZULFIDINE) 500 MG tablet TAKE 2 TABS BY MOUTH TWICE DAILY LONG TERM 03/22/20   Ladene Artist, MD    Allergies    Gold-containing drug products and Naprosyn [naproxen]  Review of Systems   Review of Systems  Constitutional:  Positive for activity change.  Neurological:  Positive for seizures.  All other systems reviewed and are negative.  Physical Exam Updated Vital Signs BP 124/78   Pulse 80   Temp 98 F (36.7 C) (Oral)   Resp 18   Ht 5' 7"  (1.702 m)   Wt 64 kg   SpO2 99%   BMI 22.10 kg/m   Physical Exam Vitals and nursing note reviewed.  Constitutional:      Appearance: He is well-developed.  HENT:     Head: Atraumatic.  Cardiovascular:     Rate and Rhythm: Normal rate.  Pulmonary:     Effort: Pulmonary effort is normal.  Musculoskeletal:     Cervical back: Neck supple.  Skin:    General: Skin is warm.  Neurological:     Mental Status: He is alert and oriented to person, place, and time.    ED Results / Procedures / Treatments   Labs (all labs ordered are listed, but only abnormal results are displayed) Labs Reviewed  CBC - Abnormal; Notable for the following components:      Result Value   WBC 15.2 (*)    RBC 4.16 (*)    MCV 100.2 (*)    All other components within normal limits  COMPREHENSIVE METABOLIC PANEL - Abnormal; Notable for the following components:   Glucose, Bld 111 (*)    All other components within normal limits  CBG MONITORING, ED    EKG None  Radiology CT HEAD WO CONTRAST (5MM)  Result Date: 01/21/2021 CLINICAL DATA:  Seizure. EXAM: CT HEAD WITHOUT  CONTRAST TECHNIQUE: Contiguous axial images were obtained from the base of the skull through the vertex without intravenous contrast. COMPARISON:  None. FINDINGS: Brain: Ventricles and sulci are appropriate for patient's age. No evidence for acute cortically based infarct, intracranial hemorrhage, mass lesion or mass-effect. Vascular: Unremarkable Skull: Intact. Sinuses/Orbits: Paranasal sinuses are well aerated. Mastoid air cells are unremarkable. Other: None. IMPRESSION: No acute intracranial process. Electronically Signed   By: Lovey Newcomer M.D.   On: 01/21/2021 18:38    Procedures Procedures   Medications Ordered in ED Medications  sodium chloride 0.9 % bolus 500 mL (0 mLs Intravenous Stopped 01/21/21 1946)  levETIRAcetam (KEPPRA) IVPB 1000 mg/100 mL premix (1,000 mg Intravenous New Bag/Given 01/21/21 1954)    ED Course  I have reviewed the triage vital signs and the nursing notes.  Pertinent labs & imaging results that were available during my care of the patient were reviewed by me and considered in my medical decision making (see chart for details).  Clinical Course as of 01/21/21 2012  Sat Jan 21, 2021  2010 Spoke with patient's girlfriend.  She reports that about a month ago patient was found on the floor by her.  She did not witness any seizure-like activity at that time.  Few days back patient reports that he remembers getting in the car and then ended up in the woods -he does not know how.  Today she actually witnessed a generalized tonic-clonic activity.  I discussed the case with neurology.  It appears that most likely patient had seizures on all of those events we will start him on Keppra 1 g load right now and Keppra 5 mg twice daily. [AN]    Clinical Course User Index [AN] Varney Biles, MD   MDM Rules/Calculators/A&P                           60 year old comes in with chief complaint of seizure.  DDx: -Seizure disorder -Meningitis -Trauma -ICH -Electrolyte  abnormality -Metabolic derangement -Stroke -Toxin induced seizures -Medication side effects -Hypoxia -Hypoglycemia   Patient reports that he had an episode last month where he found himself on the floor.  Today he was watching TV and then his girlfriend found him and called EMS.  He does not recall anything until being in the EMS rig.  Girlfriend reports that he had another episode where patient  does not have clear memory recollection few weeks back.  Clinically it appears that he has seizure disorder.  Appropriate labs ordered and are reassuring.  He uses marijuana daily and smokes 1 pack a day.  Vascular exam is reassuring.  Patient denies any headache or neck pain.  CT head is ordered and it is not showing any acute findings.  Spoke with neurology they want him to get Keppra load now.  Stable for discharge after his Keppra load.  Results and the recommendations discussed with the patient at 8:10 PM.   Final Clinical Impression(s) / ED Diagnoses Final diagnoses:  Seizure [facility])    Rx / DC Orders ED Discharge Orders          Ordered    levETIRAcetam (KEPPRA) 500 MG tablet  2 times daily        01/21/21 2011             Varney Biles, MD 01/21/21 2014

## 2021-01-21 NOTE — ED Notes (Signed)
Patient denies pain and is resting comfortably.  

## 2021-01-23 ENCOUNTER — Ambulatory Visit (INDEPENDENT_AMBULATORY_CARE_PROVIDER_SITE_OTHER): Payer: Medicare HMO | Admitting: Family Medicine

## 2021-01-23 ENCOUNTER — Encounter: Payer: Self-pay | Admitting: Family Medicine

## 2021-01-23 ENCOUNTER — Other Ambulatory Visit: Payer: Self-pay

## 2021-01-23 VITALS — BP 110/58 | HR 84 | Temp 99.0°F | Resp 16 | Ht 67.0 in | Wt 140.0 lb

## 2021-01-23 DIAGNOSIS — R55 Syncope and collapse: Secondary | ICD-10-CM

## 2021-01-23 DIAGNOSIS — G40909 Epilepsy, unspecified, not intractable, without status epilepticus: Secondary | ICD-10-CM | POA: Diagnosis not present
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
## 2021-01-27 ENCOUNTER — Encounter: Payer: Self-pay | Admitting: Neurology

## 2021-01-27 ENCOUNTER — Other Ambulatory Visit: Payer: Self-pay

## 2021-01-27 ENCOUNTER — Ambulatory Visit: Payer: Medicare HMO | Admitting: Neurology

## 2021-01-27 VITALS — BP 121/83 | HR 82 | Ht 66.0 in | Wt 139.4 lb

## 2021-01-27 DIAGNOSIS — G40009 Localization-related (focal) (partial) idiopathic epilepsy and epileptic syndromes with seizures of localized onset, not intractable, without status epilepticus: Secondary | ICD-10-CM | POA: Diagnosis not present

## 2021-01-27 MED ORDER — LEVETIRACETAM 500 MG PO TABS: 500.0000 mg | ORAL_TABLET | Freq: Two times a day (BID) | ORAL | 11 refills | Status: DC
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
## 2021-01-27 NOTE — Patient Instructions (Signed)
Proceed with MRI brain with and without contrast  2. Schedule 1-hour EEG  3. Continue Keppra 520m twice a day  4. Follow-up in 3 months, call for any changes  Seizure Precautions: 1. If medication has been prescribed for you to prevent seizures, take it exactly as directed.  Do not stop taking the medicine without talking to your doctor first, even if you have not had a seizure in a long time.   2. Avoid activities in which a seizure would cause danger to yourself or to others.  Don't operate dangerous machinery, swim alone, or climb in high or dangerous places, such as on ladders, roofs, or girders.  Do not drive unless your doctor says you may.  3. If you have any warning that you may have a seizure, lay down in a safe place where you can't hurt yourself.    4.  No driving for 6 months from last seizure, [facility]   Please refer to the following link [place] the [place] for more information: http://[email or web address]/[email or web address]/driving/drivingu.cfm   5.  Maintain good sleep hygiene.  6.  Contact your doctor if you have any problems that may be related to the medicine you are taking.  7.  Call 911 and bring the patient back to the ED if:        A.  The seizure lasts longer than 5 minutes.       B.  The patient doesn't awaken shortly after the seizure  C.  The patient has new problems such as difficulty seeing, speaking or moving  D.  The patient was injured during the seizure  E.  The patient has a temperature over 102 F (39C)  F.  The patient vomited and now is having trouble breathing

## 2021-02-02 ENCOUNTER — Ambulatory Visit: Payer: Medicare HMO | Admitting: Neurology

## 2021-02-02 ENCOUNTER — Other Ambulatory Visit: Payer: Self-pay

## 2021-02-02 DIAGNOSIS — G40009 Localization-related (focal) (partial) idiopathic epilepsy and epileptic syndromes with seizures of localized onset, not intractable, without status epilepticus: Secondary | ICD-10-CM

## 2021-02-06 NOTE — Procedures (Signed)
ELECTROENCEPHALOGRAM REPORT  Date of Study: 02/02/2021  Patient's Name: Terry Guerrero MRN: [id number] Date of Birth: March 07, 1961  Referring Provider: Dr. Ellouise Newer  Clinical History: This is a 60 year old man with recurrent episodes of staring, loss of time, recently with witnessed convulsive activity.   Medications: KEPPRA 500 MG tablet TYLENOL 500 MG tablet AZULFIDINE 500 MG tablet  Technical Summary: A multichannel digital 1-hour EEG recording measured by the international 10-20 system with electrodes applied with paste and impedances below 5000 ohms performed in our laboratory with EKG monitoring in an awake and asleep patient.  Hyperventilation was not performed. Photic stimulation was performed.  The digital EEG was referentially recorded, reformatted, and digitally filtered in a variety of bipolar and referential montages for optimal display.    Description: The patient is awake and asleep during the recording.  During maximal wakefulness, there is a symmetric, medium voltage 10 Hz posterior dominant rhythm that attenuates with eye opening.  The record is symmetric.  During drowsiness and sleep, there is an increase in theta slowing of the background.  Vertex waves and symmetric sleep spindles were seen.  Photic stimulation did not elicit any abnormalities.  There were no epileptiform discharges or electrographic seizures seen.    EKG lead was unremarkable.  Impression: This 1-hour awake and asleep EEG is normal.    Clinical Correlation: A normal EEG does not exclude a clinical diagnosis of epilepsy.  If further clinical questions remain, prolonged EEG may be helpful.  Clinical correlation is advised.   Ellouise Newer, M.D.

## 2021-02-11 ENCOUNTER — Emergency Department (HOSPITAL_COMMUNITY)
Admission: EM | Admit: 2021-02-11 | Discharge: 2021-02-11 | Disposition: A | Discharge status: Home / Self Care | Payer: Medicare HMO | Attending: Emergency Medicine | Admitting: Emergency Medicine

## 2021-02-11 ENCOUNTER — Other Ambulatory Visit: Payer: Self-pay | Admitting: Nurse Practitioner

## 2021-02-11 DIAGNOSIS — F17210 Nicotine dependence, cigarettes, uncomplicated: Secondary | ICD-10-CM | POA: Insufficient documentation

## 2021-02-11 DIAGNOSIS — G40909 Epilepsy, unspecified, not intractable, without status epilepticus: Secondary | ICD-10-CM | POA: Diagnosis not present

## 2021-02-11 DIAGNOSIS — W19XXXA Unspecified fall, initial encounter: Secondary | ICD-10-CM | POA: Diagnosis not present

## 2021-02-11 DIAGNOSIS — Z79899 Other long term (current) drug therapy: Secondary | ICD-10-CM | POA: Diagnosis not present

## 2021-02-11 DIAGNOSIS — R569 Unspecified convulsions: Secondary | ICD-10-CM

## 2021-02-11 DIAGNOSIS — R0902 Hypoxemia: Secondary | ICD-10-CM | POA: Diagnosis not present

## 2021-02-11 DIAGNOSIS — R69 Illness, unspecified: Secondary | ICD-10-CM | POA: Diagnosis not present

## 2021-02-11 DIAGNOSIS — G4089 Other seizures: Secondary | ICD-10-CM | POA: Diagnosis not present

## 2021-02-11 DIAGNOSIS — Z743 Need for continuous supervision: Secondary | ICD-10-CM | POA: Diagnosis not present

## 2021-02-11 DIAGNOSIS — M25562 Pain in left knee: Secondary | ICD-10-CM

## 2021-02-11 LAB — BASIC METABOLIC PANEL
Anion gap: 5 (ref 5–15)
BUN: 7 mg/dL (ref 6–20)
CO2: 25 mmol/L (ref 22–32)
Calcium: 9 mg/dL (ref 8.9–10.3)
Chloride: 107 mmol/L (ref 98–111)
Creatinine, Ser: 0.76 mg/dL (ref 0.61–1.24)
GFR, Estimated: >60 mL/min (ref >60)
Glucose, Bld: 108 mg/dL — ABNORMAL HIGH (ref 70–99)
Potassium: 3.8 mmol/L (ref 3.5–5.1)
Sodium: 137 mmol/L (ref 135–145)

## 2021-02-11 MED ORDER — LEVETIRACETAM 500 MG PO TABS
500.0000 mg | ORAL_TABLET | Freq: Once | ORAL | Status: AC
  Administered 2021-02-11: 500 mg via ORAL
  Filled 2021-02-11: qty 1

## 2021-02-11 NOTE — Discharge Instructions (Signed)
You were seen in the emergency department for having 2 seizures today.  You have a normal neurologic exam and your blood work was unremarkable.  You were given a dose of Keppra.  Please continue your Keppra and follow-up with your primary care doctor and neurologist.  Return to the emergency department if any worsening or concerning symptoms.

## 2021-02-11 NOTE — ED Provider Notes (Signed)
[facility] EMERGENCY DEPARTMENT Provider Note   CSN: [id number] Arrival date & time: 02/11/21  2004     History Chief Complaint  Patient presents with   Seizures    Terry Guerrero is a 60 y.o. male.  He has a history of seizure disorder and follows with Dr. Delice Lesch from neurology.  He is currently on Keppra 500 twice a day.  He reportedly had 2 seizures today.  He was at home.  He had normal mental status in between the seizures.  He said he did not take his Keppra today.  He has no complaints.  He does smoke cigarettes but denies any frequent alcohol use.  The history is provided by the patient.  Seizures Seizure activity on arrival: no   Seizure type:  Unable to specify Initial focality:  Unable to specify Return to baseline: yes   Severity:  Moderate Duration:  25 seconds Number of seizures this episode:  2 Progression:  Resolved Context: not alcohol withdrawal and not fever   Recent head injury:  No recent head injuries PTA treatment:  None History of seizures: yes       Past Medical History:  Diagnosis Date   Arthritis    GERD (gastroesophageal reflux disease)    Rheumatoid arthritis(714.0)    Ulcerative colitis ([place])     Patient Active Problem List   Diagnosis Date Noted   Rectal bleeding 05/07/2019   Strain of left pectoralis muscle 02/13/2017   Rheumatoid arthritis(714.0)     Past Surgical History:  Procedure Laterality Date   ANTERIOR CRUCIATE LIGAMENT REPAIR     Left    COLONOSCOPY     HERNIA REPAIR         Family History  Problem Relation Age of Onset   Alcoholism Mother    Colon cancer Neg Hx    Esophageal cancer Neg Hx    Stomach cancer Neg Hx    Rectal cancer Neg Hx     Social History   Tobacco Use   Smoking status: Every Day    Types: Cigarettes   Smokeless tobacco: Never  Vaping Use   Vaping Use: Never used  Substance Use Topics   Alcohol use: Yes    Comment: occasional   Drug use: Yes    Types:  Marijuana    Comment: yesterday 01/20/21    Home Medications Prior to Admission medications   Medication Sig Start Date End Date Taking? Authorizing Provider  acetaminophen (TYLENOL) 500 MG tablet Take 1 tablet (500 mg total) by mouth every 6 (six) hours as needed for moderate pain. 09/21/20   Eulogio Bear, NP  levETIRAcetam (KEPPRA) 500 MG tablet Take 1 tablet (500 mg total) by mouth 2 (two) times daily. 01/27/21   Cameron Sprang, MD  sulfaSALAzine (AZULFIDINE) 500 MG tablet TAKE 2 TABS BY MOUTH TWICE DAILY LONG TERM 03/22/20   Ladene Artist, MD    Allergies    Gold-containing drug products and Naprosyn [naproxen]  Review of Systems   Review of Systems  Constitutional:  Negative for fever.  HENT:  Negative for sore throat.   Eyes:  Negative for visual disturbance.  Respiratory:  Negative for shortness of breath.   Cardiovascular:  Negative for chest pain.  Gastrointestinal:  Negative for abdominal pain.  Genitourinary:  Negative for dysuria.  Musculoskeletal:  Negative for neck pain.  Skin:  Negative for rash.  Neurological:  Positive for seizures.   Physical Exam Updated Vital Signs BP 111/79 (  BP Location: Right Arm)   Pulse 69   Temp 98.5 F (36.9 C) (Oral)   Resp 15   SpO2 99%   Physical Exam Vitals and nursing note reviewed.  Constitutional:      Appearance: Normal appearance. He is well-developed.  HENT:     Head: Normocephalic and atraumatic.  Eyes:     Conjunctiva/sclera: Conjunctivae normal.  Cardiovascular:     Rate and Rhythm: Normal rate and regular rhythm.     Heart sounds: No murmur heard. Pulmonary:     Effort: Pulmonary effort is normal. No respiratory distress.     Breath sounds: Normal breath sounds.  Abdominal:     Palpations: Abdomen is soft.     Tenderness: There is no abdominal tenderness.  Musculoskeletal:        General: No signs of injury.     Cervical back: Neck supple.  Skin:    General: Skin is warm and dry.  Neurological:      General: No focal deficit present.     Mental Status: He is alert and oriented to person, place, and time.    ED Results / Procedures / Treatments   Labs (all labs ordered are listed, but only abnormal results are displayed) Labs Reviewed  BASIC METABOLIC PANEL - Abnormal; Notable for the following components:      Result Value   Glucose, Bld 108 (*)    All other components within normal limits    EKG None  Radiology No results found.  Procedures Procedures   Medications Ordered in ED Medications  levETIRAcetam (KEPPRA) tablet 500 mg (has no administration in time range)    ED Course  I have reviewed the triage vital signs and the nursing notes.  Pertinent labs & imaging results that were available during my care of the patient were reviewed by me and considered in my medical decision making (see chart for details).    MDM Rules/Calculators/A&P                          This patient complains of seizure x2; this involves an extensive number of treatment Options and is a complaint that carries with it a high risk of complications and Morbidity. The differential includes seizure, metabolic derangement, noncompliance, infection  I ordered, reviewed and interpreted labs, which included chemistries with mildly elevated glucose normal gap I ordered medication oral Keppra  Previous records obtained and reviewed in epic including prior neurology notes with Dr. Delice Lesch  After the interventions stated above, I reevaluated the patient and found patient to be asymptomatic afebrile with normal vitals.  Counseled patient to not drive and recommended close follow-up with his treating neurologist.  Return instructions discussed   Final Clinical Impression(s) / ED Diagnoses Final diagnoses:  Seizure [facility])    Rx / DC Orders ED Discharge Orders     None        Hayden Rasmussen, MD 02/12/21 1110

## 2021-02-11 NOTE — ED Triage Notes (Signed)
Pt BIB [facility] d/t seizure. Per EMS, pt had a seizure earlier today; however, refused to come in until he got another witnessed seizure  for abt 25 seconds later on the day. Pt on Keppra, but did not take it today. A&O X4.   BP 110/82 HR 71 95% RA  CBG 119  TEMP 98.2

## 2021-02-13 MED ORDER — ACETAMINOPHEN 500 MG PO TABS: 500.0000 mg | ORAL_TABLET | Freq: Four times a day (QID) | ORAL | 0 refills | Status: DC | PRN

## 2021-02-18 ENCOUNTER — Ambulatory Visit
Admission: RE | Admit: 2021-02-18 | Discharge: 2021-02-18 | Disposition: A | Discharge status: Home / Self Care | Payer: Medicare HMO | Source: Ambulatory Visit | Attending: Family Medicine | Admitting: Family Medicine

## 2021-02-18 DIAGNOSIS — G40909 Epilepsy, unspecified, not intractable, without status epilepticus: Secondary | ICD-10-CM

## 2021-02-18 DIAGNOSIS — M5441 Lumbago with sciatica, right side: Secondary | ICD-10-CM

## 2021-02-18 DIAGNOSIS — M545 Low back pain, unspecified: Secondary | ICD-10-CM | POA: Diagnosis not present

## 2021-02-18 DIAGNOSIS — R569 Unspecified convulsions: Secondary | ICD-10-CM | POA: Diagnosis not present

## 2021-02-18 DIAGNOSIS — M48061 Spinal stenosis, lumbar region without neurogenic claudication: Secondary | ICD-10-CM | POA: Diagnosis not present

## 2021-02-18 IMAGING — MR MR LUMBAR SPINE W/O CM
4 of 5 series · 26 of 48 positions shown · non-contrast
Comparison: No prior MRI, correlation is made with lumbar spine
radiographs [DATE]

CLINICAL DATA: Low back pain, increased fracture risk

EXAM:
MRI LUMBAR SPINE WITHOUT CONTRAST
TECHNIQUE: Multiplanar, multisequence MR imaging of the lumbar spine was
performed. No intravenous contrast was administered.

[Series 2: T2 · sagittal · 4.0mm · 1.09mm/px · 5 of 15 slices shown (1 of 2)]
[im 1/15]
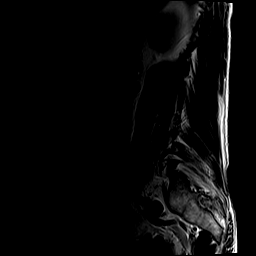
[im 4/15]
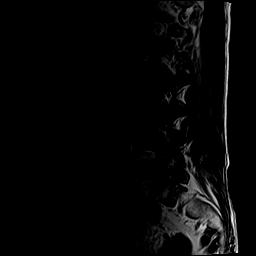
[im 8/15]
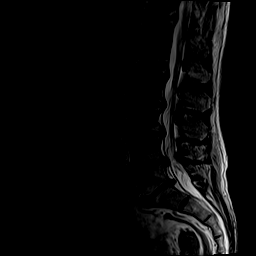
[im 11/15]
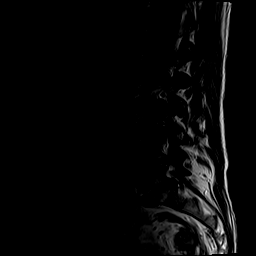
[im 15/15]
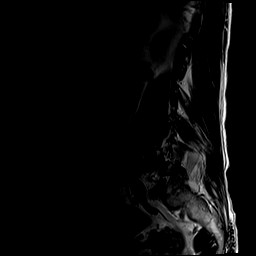

[Series 4: T1 · sagittal · 4.0mm · 1.09mm/px · 6 of 15 slices shown (1 of 2)]
[im 1/15]
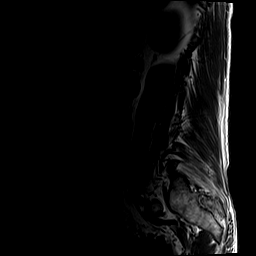
[im 3/15]
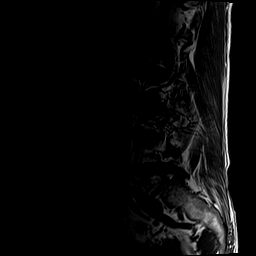
[im 6/15]
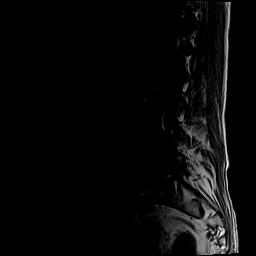
[im 9/15]
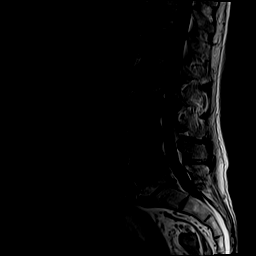
[im 12/15]
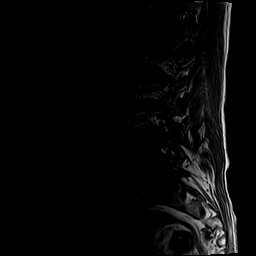
[im 15/15]
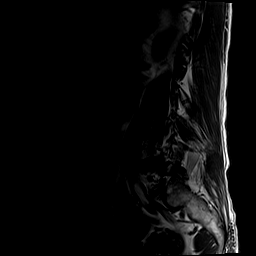

[Series 5: T2 · axial · 4.0mm · 0.39mm/px · z∈[-47,+156]mm · 10 of 42 slices shown (2 of 2)]
[im 3/42]
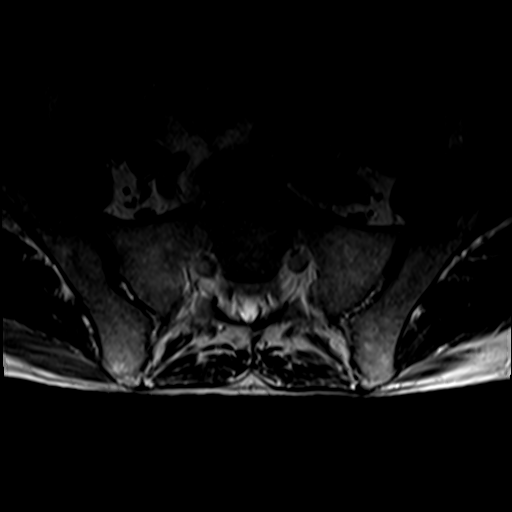
[im 6/42]
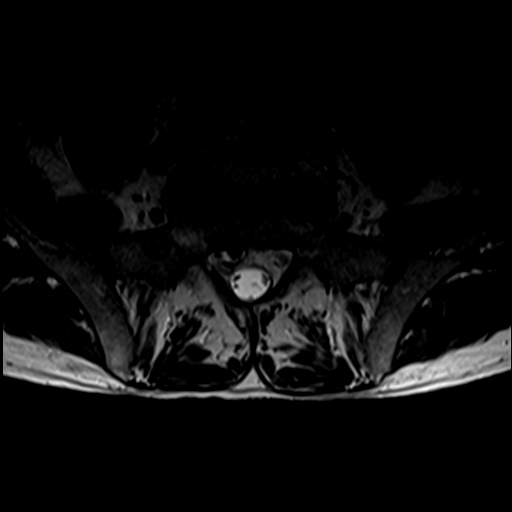
[im 9/42]
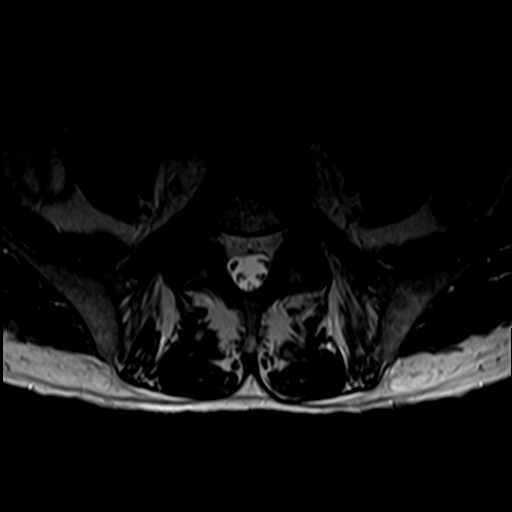
[im 14/42]
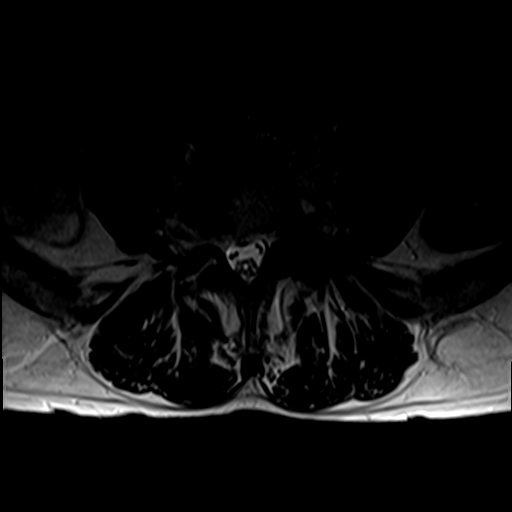
[im 20/42]
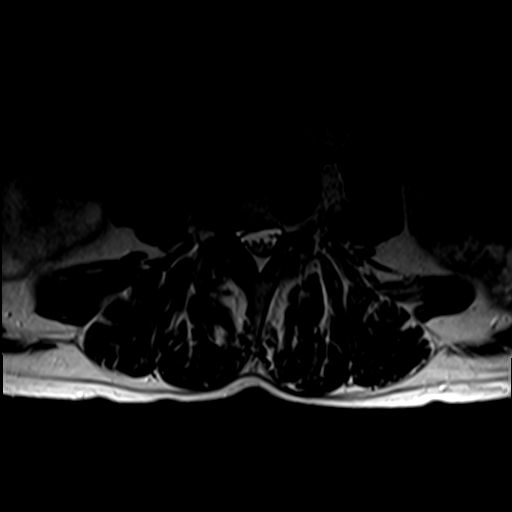
[im 22/42]
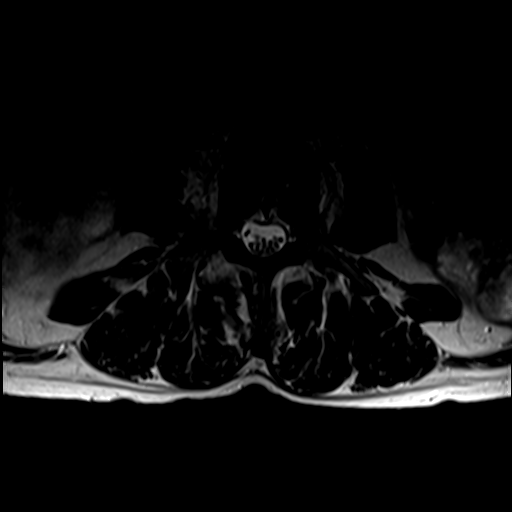
[im 25/42]
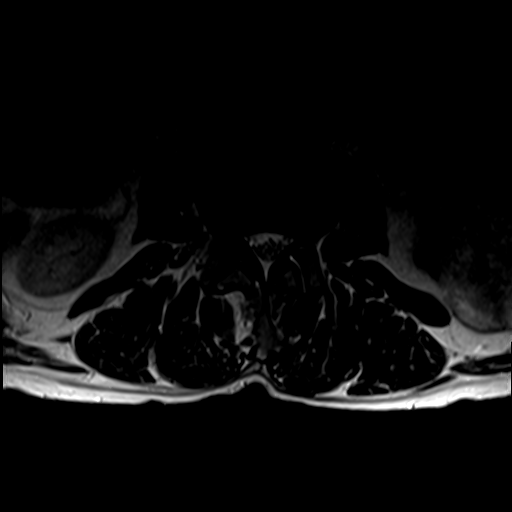
[im 31/42]
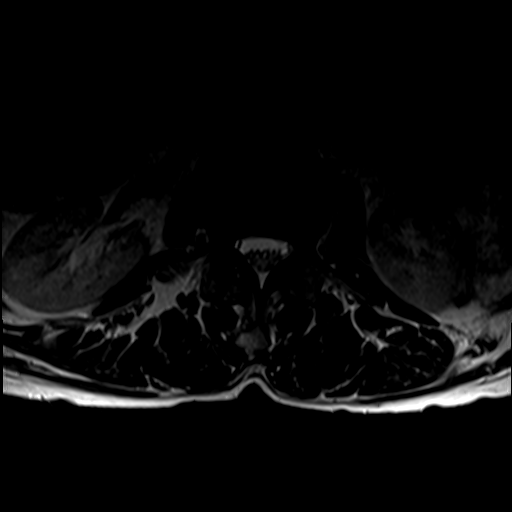
[im 36/42]
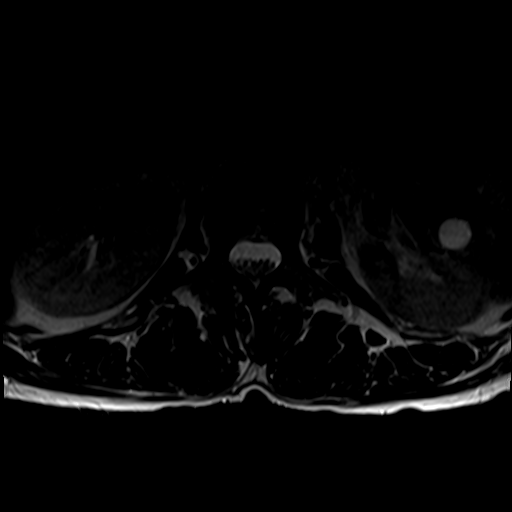
[im 42/42]
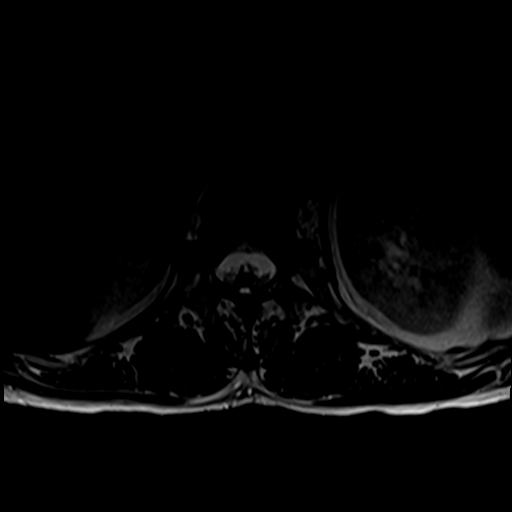

[Series 6: T1 · axial · 4.0mm · 0.39mm/px · z∈[-47,+127]mm · 5 of 42 slices shown (2 of 2)]
[im 3/42]
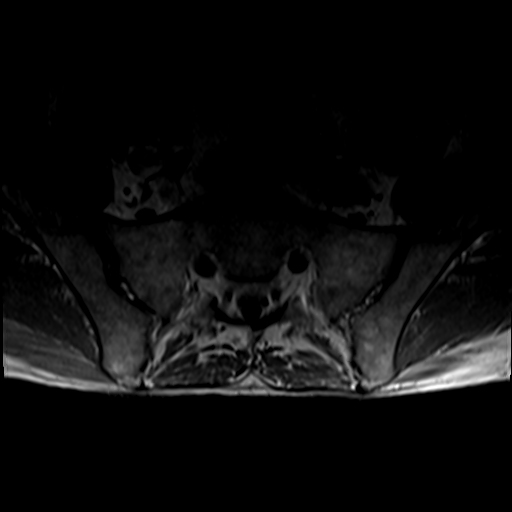
[im 6/42]
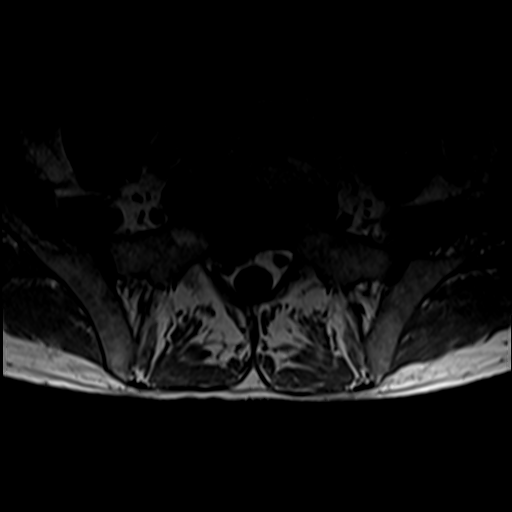
[im 9/42]
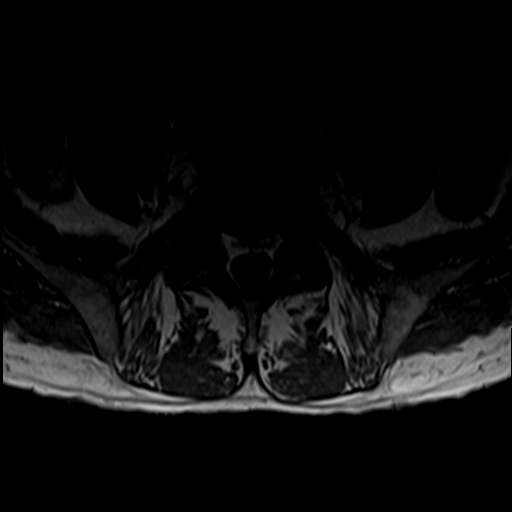
[im 22/42]
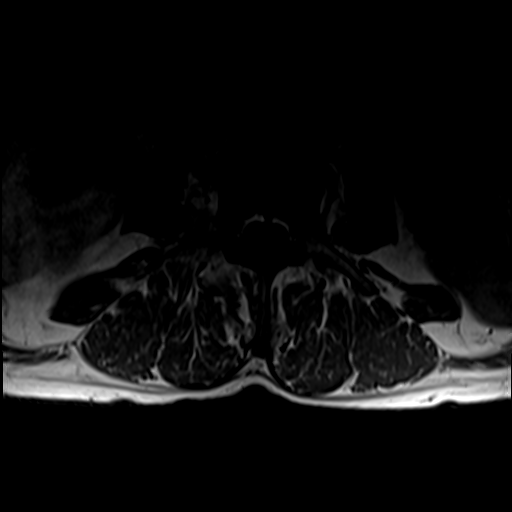
[im 36/42]
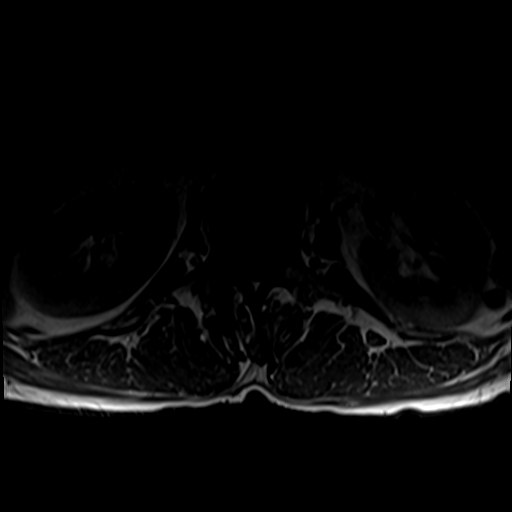

[26 of 48 positions shown; findings below may reference images not displayed]

FINDINGS: Segmentation: Bilateral assimilation joints at L5. Otherwise normal
segmentation.

Alignment:  Levocurvature of the lumbar spine.

Vertebrae: No acute fracture or suspicious lesion. Endplate
degenerative changes most prominently at L5-S1.

Conus medullaris and cauda equina: Conus extends to the T12-L1
level. Conus and cauda equina appear normal.

Paraspinal and other soft tissues: Left renal cyst.

Disc levels:

T12-L1: No significant disc bulge. No spinal canal stenosis or
neural foraminal narrowing.

L1-L2: Disc height loss broad-based disc bulge. Mild facet
arthropathy. No spinal canal stenosis or neural foraminal narrowing.

L2-L3: Disc height loss with broad-based disc bulge. Moderate facet
arthropathy. No spinal canal stenosis or neural foraminal narrowing.

L3-L4: Disc height loss with broad-based disc bulge and superimposed
left foraminal and extreme lateral protrusion. Moderate facet
arthropathy. Narrowing of the left-greater-than-right lateral
recess. Mild spinal canal stenosis. Mild left and moderate right
neural foraminal narrowing.

L4-L5: Disc height loss with broad-based disc bulge. Moderate facet
arthropathy. No spinal canal stenosis. Severe left and mild right
neural foraminal narrowing.

L5-S1: Disc height loss with broad-based disc bulge. Mild facet
arthropathy. No spinal canal stenosis. Severe bilateral neural
foraminal narrowing.
IMPRESSION: 1. L3-L4 mild spinal canal stenosis, with moderate right and mild
left neural foraminal narrowing. Narrowing of the
left-greater-than-right lateral recess at this level could affect
the descending L4 nerves.
2. L5-S1 severe bilateral neural foraminal narrowing.
3. L4-L5 severe left and mild right neural foraminal narrowing.
4. No acute fracture.

## 2021-02-18 IMAGING — MR MR HEAD WO/W CM
13 series · 48 of 48 positions shown · IV contrast (multihance)
Comparison: No prior MRI, correlation is made with CT head
[DATE].

CLINICAL DATA: Seizure, syncope

EXAM:
MRI HEAD WITHOUT AND WITH CONTRAST
TECHNIQUE: Multiplanar, multiecho pulse sequences of the brain and surrounding
structures were obtained without and with intravenous contrast.
CONTRAST:  13mL MULTIHANCE GADOBENATE DIMEGLUMINE 529 MG/ML IV SOLN

[Series 2: T1 · sagittal · 5.0mm · 0.45mm/px · 1 of 21 slices shown]
[im 1/21]
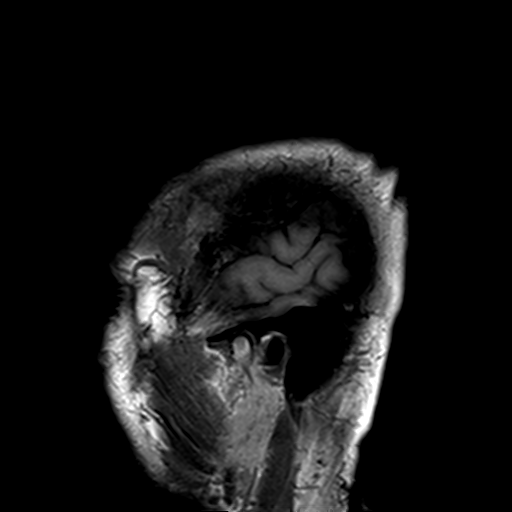

[Series 3: DWI · axial · 3.0mm · 1.80mm/px · z∈[-78,+69]mm · 6 of 100 slices shown (1 of 4)]
[im 1/100]
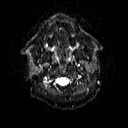
[im 20/100]
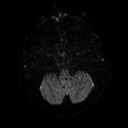
[im 40/100]
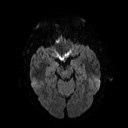
[im 60/100]
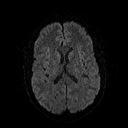
[im 80/100]
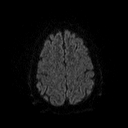
[im 100/100]
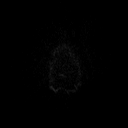

[Series 4: DWI · axial · 3.0mm · 1.80mm/px · z∈[-78,+69]mm · 3 of 47 slices shown (2 of 4)]
[im 1/47]
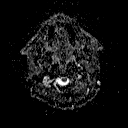
[im 24/47]
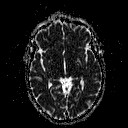
[im 47/47]
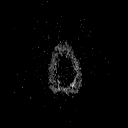

[Series 5: DWI · coronal · 5.0mm · 1.80mm/px · 5 of 68 slices shown (3 of 4)]
[im 1/68]
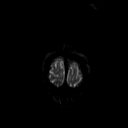
[im 17/68]
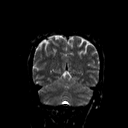
[im 34/68]
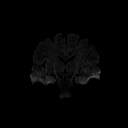
[im 51/68]
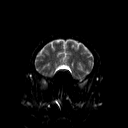
[im 68/68]
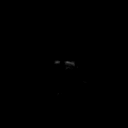

[Series 6: DWI · coronal · 5.0mm · 1.80mm/px · 2 of 34 slices shown (4 of 4)]
[im 1/34]
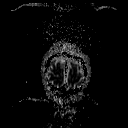
[im 34/34]
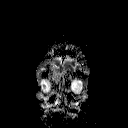

[Series 7: T2 · axial · 5.0mm · 0.60mm/px · 1 of 22 slices shown (1 of 2)]
[im 1/22]
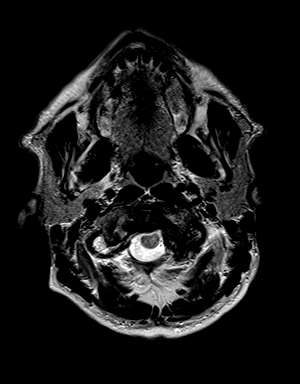

[Series 8: FLAIR · axial · 3.0mm · 0.45mm/px · z∈[-72,+63]mm · 2 of 30 slices shown (1 of 2)]
[im 1/30]
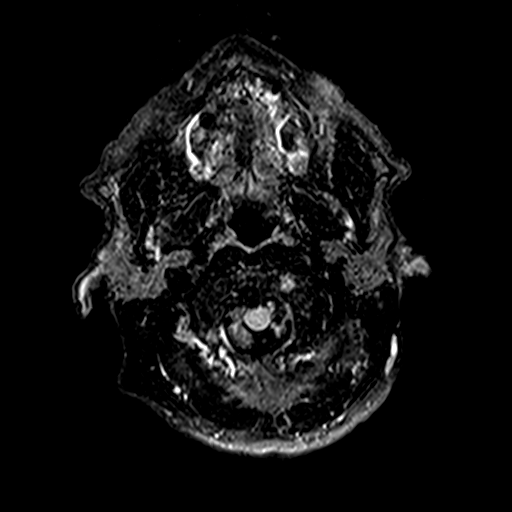
[im 30/30]
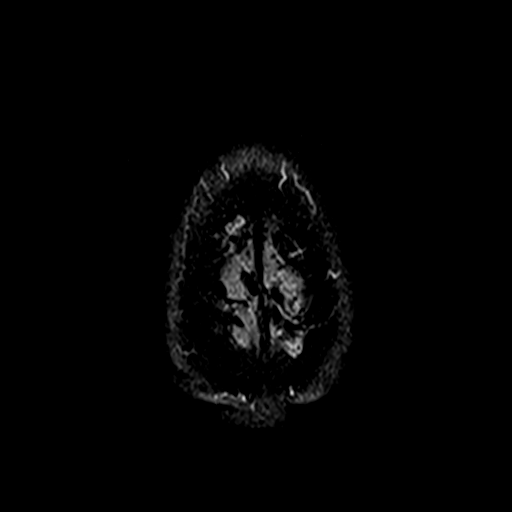

[Series 10: swi_images · axial · 4.0mm · 0.90mm/px · z∈[-74,+65]mm · 2 of 36 slices shown]
[im 1/36]
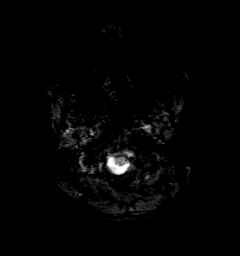
[im 36/36]
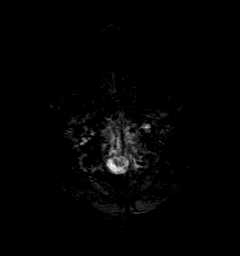

[Series 11: t1_mpr_tra · axial · 1.0mm · 0.75mm/px · z∈[-76,+67]mm · 10 of 144 slices shown]
[im 1/144]
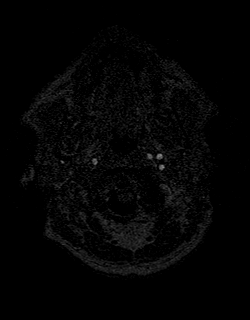
[im 16/144]
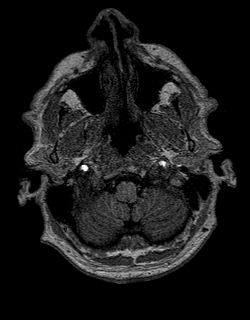
[im 32/144]
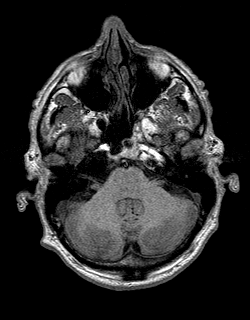
[im 48/144]
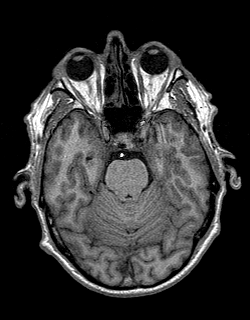
[im 64/144]
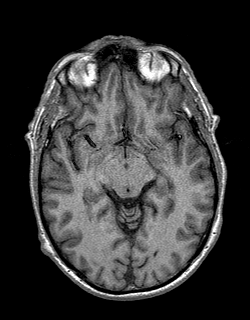
[im 80/144]
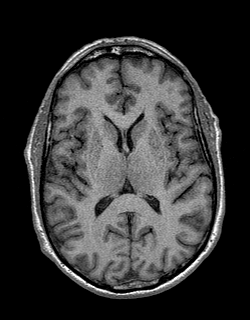
[im 96/144]
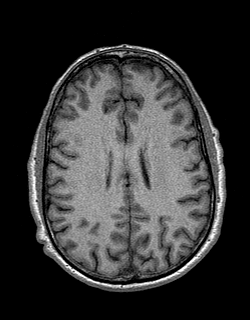
[im 112/144]
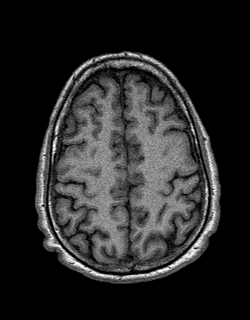
[im 128/144]
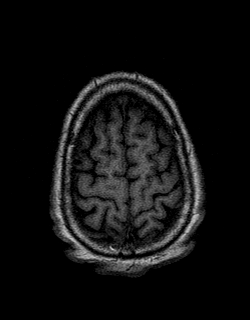
[im 144/144]
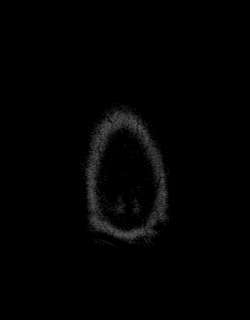

[Series 12: T2 · coronal · 5.0mm · 0.45mm/px · 2 of 25 slices shown (2 of 2)]
[im 1/25]
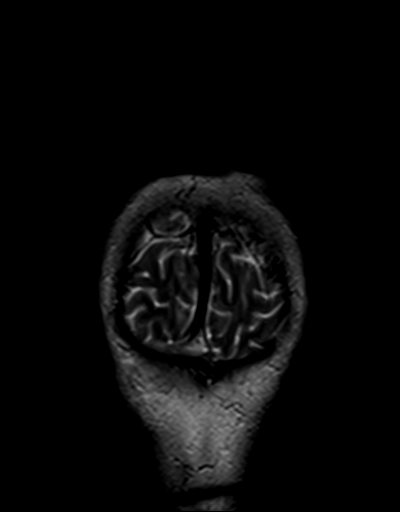
[im 25/25]
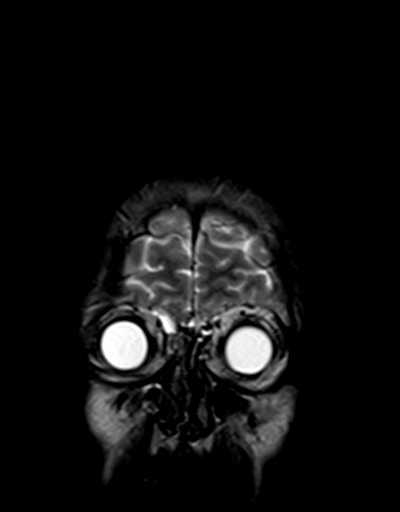

[Series 13: t1_mpr_tra post · axial · 1.0mm · 0.75mm/px · z∈[-76,+67]mm · 10 of 144 slices shown]
[im 1/144]
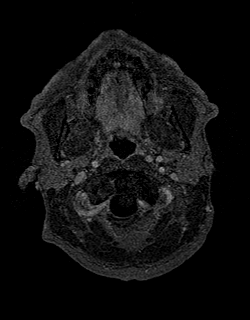
[im 16/144]
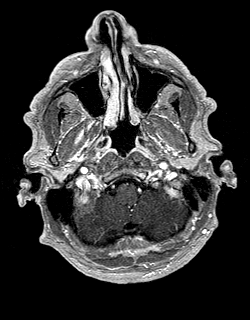
[im 32/144]
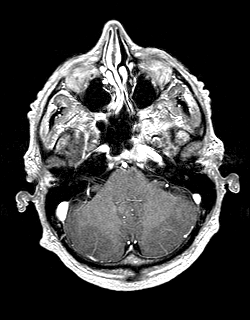
[im 48/144]
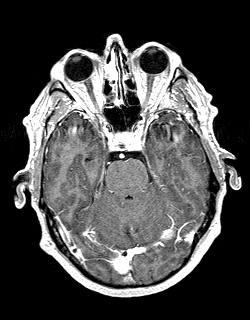
[im 64/144]
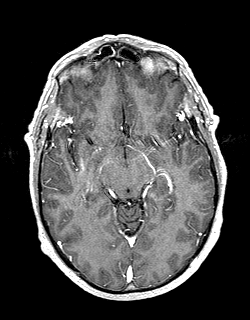
[im 80/144]
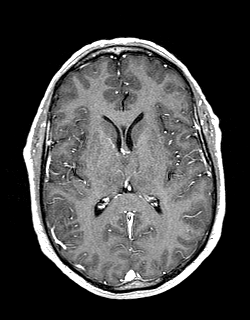
[im 96/144]
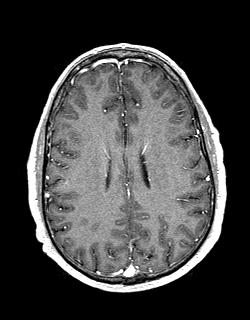
[im 112/144]
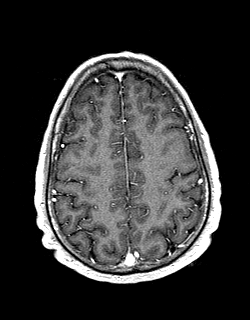
[im 128/144]
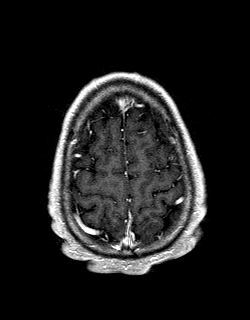
[im 144/144]
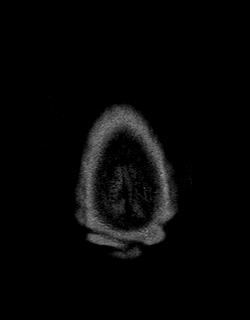

[Series 14: post cor · coronal · 5.0mm · 0.45mm/px · 2 of 25 slices shown]
[im 1/25]
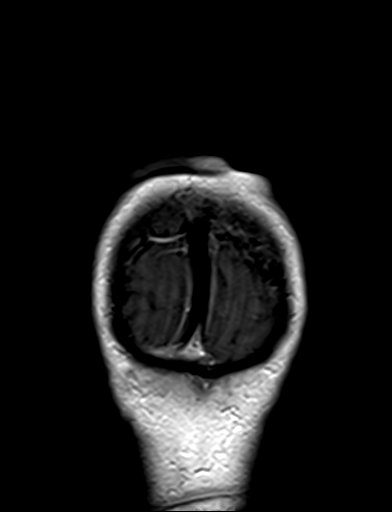
[im 25/25]
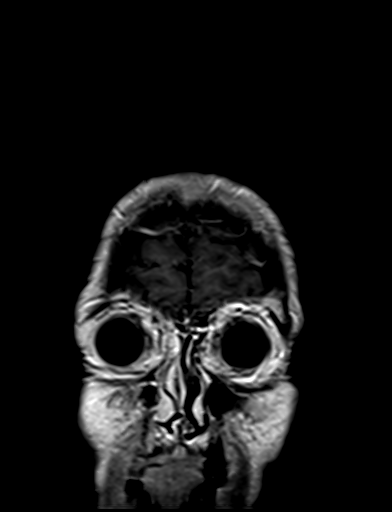

[Series 15: FLAIR · sagittal · 5.0mm · 0.45mm/px · 2 of 25 slices shown (2 of 2)]
[im 1/25]
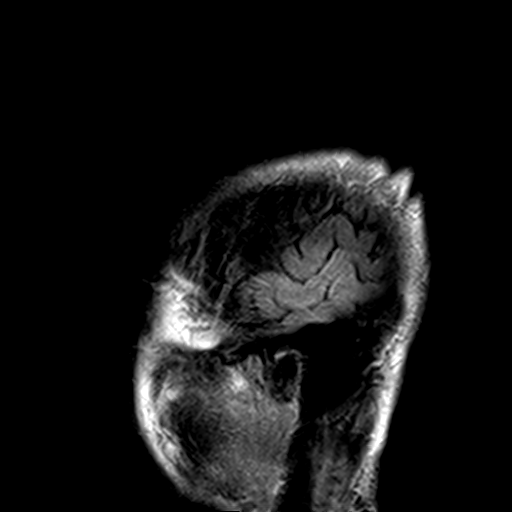
[im 25/25]
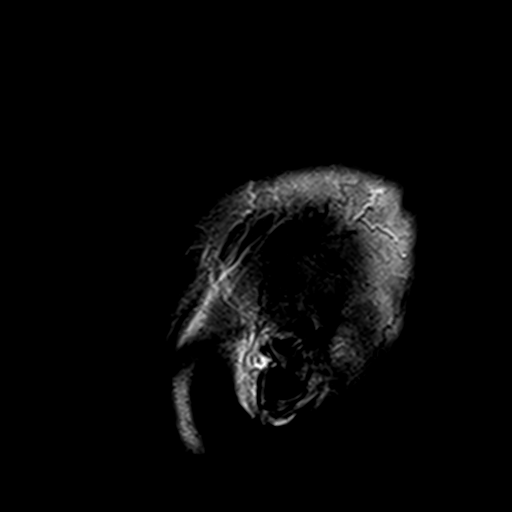

[48 of 48 positions shown; findings below may reference images not displayed]

FINDINGS: Brain: No acute infarction, hemorrhage, hydrocephalus, extra-axial
collection or mass lesion. No abnormal enhancement. No foci of
hemosiderin deposition to suggest remote hemorrhage. Midline
structures are normal. No heterotopia.

Vascular: Normal flow voids.

Skull and upper cervical spine: Normal marrow signal.

Sinuses/Orbits: Mild mucosal thickening throughout the paranasal
sinuses. The orbits are unremarkable.

Other: The mastoids are well aerated.
IMPRESSION: No acute intracranial process. No etiology is seen for the patient's
syncope or seizure.

## 2021-02-18 MED ORDER — GADOBENATE DIMEGLUMINE 529 MG/ML IV SOLN
13.0000 mL | Freq: Once | INTRAVENOUS | Status: AC | PRN
  Administered 2021-02-18: 13 mL via INTRAVENOUS

## 2021-02-24 ENCOUNTER — Other Ambulatory Visit: Payer: Self-pay | Admitting: *Deleted

## 2021-02-24 DIAGNOSIS — M5431 Sciatica, right side: Secondary | ICD-10-CM

## 2021-02-24 DIAGNOSIS — M47819 Spondylosis without myelopathy or radiculopathy, site unspecified: Secondary | ICD-10-CM

## 2021-02-24 DIAGNOSIS — M5441 Lumbago with sciatica, right side: Secondary | ICD-10-CM

## 2021-02-24 DIAGNOSIS — M5432 Sciatica, left side: Secondary | ICD-10-CM

## 2021-02-24 DIAGNOSIS — M5442 Lumbago with sciatica, left side: Secondary | ICD-10-CM

## 2021-03-01 ENCOUNTER — Other Ambulatory Visit: Payer: Self-pay | Admitting: *Deleted

## 2021-03-01 NOTE — Telephone Encounter (Signed)
Received call from patient.   Reports that he has been taking APAP for back pain with sciatica with no relief.   Requested refill on Hydrocodone/APAP.  Ok to refill??  Last office visit 01/23/2021.  Last refill 01/13/2021.

## 2021-03-02 MED ORDER — OXYCODONE-ACETAMINOPHEN 5-325 MG PO TABS: 1.0000 | ORAL_TABLET | ORAL | 0 refills | Status: DC | PRN

## 2021-03-14 ENCOUNTER — Encounter: Payer: Self-pay | Admitting: Family Medicine

## 2021-03-15 DIAGNOSIS — M519 Unspecified thoracic, thoracolumbar and lumbosacral intervertebral disc disorder: Secondary | ICD-10-CM | POA: Diagnosis not present

## 2021-03-15 DIAGNOSIS — M4150 Other secondary scoliosis, site unspecified: Secondary | ICD-10-CM | POA: Insufficient documentation

## 2021-03-15 DIAGNOSIS — M5451 Vertebrogenic low back pain: Secondary | ICD-10-CM | POA: Diagnosis not present

## 2021-03-27 ENCOUNTER — Ambulatory Visit: Payer: Medicare HMO | Admitting: Cardiology

## 2021-03-27 ENCOUNTER — Encounter: Payer: Self-pay | Admitting: Family Medicine

## 2021-03-27 ENCOUNTER — Other Ambulatory Visit: Payer: Self-pay | Admitting: Family Medicine

## 2021-03-27 ENCOUNTER — Encounter: Payer: Self-pay | Admitting: Cardiology

## 2021-03-27 ENCOUNTER — Other Ambulatory Visit: Payer: Self-pay

## 2021-03-27 VITALS — BP 110/60 | HR 86 | Ht 66.0 in | Wt 144.4 lb

## 2021-03-27 DIAGNOSIS — R55 Syncope and collapse: Secondary | ICD-10-CM | POA: Diagnosis not present

## 2021-03-27 MED ORDER — HYDROCODONE-ACETAMINOPHEN 5-325 MG PO TABS: 1.0000 | ORAL_TABLET | Freq: Four times a day (QID) | ORAL | 0 refills | Status: DC | PRN

## 2021-03-27 NOTE — Patient Instructions (Signed)
Medication Instructions:  Your physician recommends that you continue on your current medications as directed. Please refer to the Current Medication list given to you today.  *If you need a refill on your cardiac medications before your next appointment, please call your pharmacy*   Lab Work: None If you have labs (blood work) drawn today and your tests are completely normal, you will receive your results only by: [place] (if you have MyChart) OR A paper copy in the mail If you have any lab test that is abnormal or we need to change your treatment, we will call you to review the results.   Testing/Procedures: Your physician has requested that you have an echocardiogram. Echocardiography is a painless test that uses sound waves to create images of your heart. It provides your doctor with information about the size and shape of your heart and how well your heart's chambers and valves are working. This procedure takes approximately one hour. There are no restrictions for this procedure.    Follow-Up: At [facility], you and your health needs are our priority.  As part of our continuing mission to provide you with exceptional heart care, we have created designated Provider Care Teams.  These Care Teams include your primary Cardiologist (physician) and Advanced Practice Providers (APPs -  Physician Assistants and Nurse Practitioners) who all work together to provide you with the care you need, when you need it.  We recommend signing up for the patient portal called "MyChart".  Sign up information is provided on this After Visit Summary.  MyChart is used to connect with patients for Virtual Visits (Telemedicine).  Patients are able to view lab/test results, encounter notes, upcoming appointments, etc.  Non-urgent messages can be sent to your provider as well.   To learn more about what you can do with MyChart, go to [email or web address].    Your next appointment:   Follow Up-  Pending    Other Instructions 30 day Cardiac Event Monitor will be mailed to your home address.

## 2021-03-27 NOTE — Progress Notes (Signed)
Clinical Summary Mr. Terry Guerrero is a 60 y.o.male seen today for follow up of the following medical problems.   1.Sycnope - episodes started about 2 months ago - 3 episodes  - most recent episode about 1 month ago - at home sitting eating dinner. No prodrome. Next thin he remember being in ambulance. She reports he had some seizure like motions. Taken [facility] for evaluation.   - prior episode watching tv. No prodrome, sudden LOC. Remembers rescue squad evalute   - 1st episode standing at stove. Felt hot and sweaty, placed hands on counter. Next he knows was down on the floor.  - started on keppra, followed by neuro. Ongoing history workup by neuro for possible seizure.  - does have chronic pain, baseline pain 8/10 in severity.     Past Medical History:  Diagnosis Date   Arthritis    GERD (gastroesophageal reflux disease)    Rheumatoid arthritis(714.0)    Ulcerative colitis ([facility])      Allergies  Allergen Reactions   Gold-Containing Drug Products    Naprosyn [Naproxen]      Current Outpatient Medications  Medication Sig Dispense Refill   acetaminophen (TYLENOL) 500 MG tablet Take 1 tablet (500 mg total) by mouth every 6 (six) hours as needed for moderate pain. 30 tablet 0   levETIRAcetam (KEPPRA) 500 MG tablet Take 1 tablet (500 mg total) by mouth 2 (two) times daily. 60 tablet 11   oxyCODONE-acetaminophen (PERCOCET/ROXICET) 5-325 MG tablet Take 1 tablet by mouth every 4 (four) hours as needed for severe pain. 60 tablet 0   sulfaSALAzine (AZULFIDINE) 500 MG tablet TAKE 2 TABS BY MOUTH TWICE DAILY LONG TERM 120 tablet 11   No current facility-administered medications for this visit.     Past Surgical History:  Procedure Laterality Date   ANTERIOR CRUCIATE LIGAMENT REPAIR     Left    COLONOSCOPY     HERNIA REPAIR       Allergies  Allergen Reactions   Gold-Containing Drug Products    Naprosyn [Naproxen]       Family History  Problem Relation Age of  Onset   Alcoholism Mother    Colon cancer Neg Hx    Esophageal cancer Neg Hx    Stomach cancer Neg Hx    Rectal cancer Neg Hx      Social History Mr. Grime reports that he has been smoking cigarettes. He has never used smokeless tobacco. Mr. Ortez reports current alcohol use.   Review of Systems CONSTITUTIONAL: No weight loss, fever, chills, weakness or fatigue.  HEENT: Eyes: No visual loss, blurred vision, double vision or yellow sclerae.No hearing loss, sneezing, congestion, runny nose or sore throat.  SKIN: No rash or itching.  CARDIOVASCULAR: per hpi RESPIRATORY: No shortness of breath, cough or sputum.  GASTROINTESTINAL: No anorexia, nausea, vomiting or diarrhea. No abdominal pain or blood.  GENITOURINARY: No burning on urination, no polyuria NEUROLOGICAL: No headache, dizziness, syncope, paralysis, ataxia, numbness or tingling in the extremities. No change in bowel or bladder control.  MUSCULOSKELETAL: No muscle, back pain, joint pain or stiffness.  LYMPHATICS: No enlarged nodes. No history of splenectomy.  PSYCHIATRIC: No history of depression or anxiety.  ENDOCRINOLOGIC: No reports of sweating, cold or heat intolerance. No polyuria or polydipsia.  Marland Kitchen   Physical Examination Today's Vitals   03/27/21 1507  BP: 110/60  Pulse: 86  SpO2: 96%  Weight: 144 lb 6.4 oz (65.5 kg)  Height: 5' 6"  (1.676 m)  Body mass index is 23.31 kg/m.  Gen: resting comfortably, no acute distress HEENT: no scleral icterus, pupils equal round and reactive, no palptable cervical adenopathy,  CV: RRR, no m/r/g, no jvd Resp: Clear to auscultation bilaterally GI: abdomen is soft, non-tender, non-distended, normal bowel sounds, no hepatosplenomegaly MSK: extremities are warm, no edema.  Skin: warm, no rash Neuro:  no focal deficits Psych: appropriate affect     Assessment and Plan   1.Syncope - ongoing workup by neuro for neurological cause including possible seizures - unclear if  any potentail cardiac cause, his episodes do occur for the most part without a prodrome - baseline EKG does show signs of conduction disease - plan for echo, 30 day monitor for syncope.      Arnoldo Lenis, M.D.

## 2021-04-04 ENCOUNTER — Other Ambulatory Visit: Payer: Self-pay | Admitting: Gastroenterology

## 2021-04-04 DIAGNOSIS — R55 Syncope and collapse: Secondary | ICD-10-CM | POA: Diagnosis not present

## 2021-04-07 ENCOUNTER — Ambulatory Visit (INDEPENDENT_AMBULATORY_CARE_PROVIDER_SITE_OTHER): Payer: Medicare HMO

## 2021-04-07 ENCOUNTER — Other Ambulatory Visit: Payer: Self-pay

## 2021-04-07 DIAGNOSIS — R55 Syncope and collapse: Secondary | ICD-10-CM

## 2021-04-07 NOTE — Addendum Note (Signed)
Addended by: Barbarann Ehlers A on: 04/07/2021 01:19 PM   Modules accepted: Orders

## 2021-04-10 ENCOUNTER — Telehealth: Payer: Self-pay

## 2021-04-10 DIAGNOSIS — R55 Syncope and collapse: Secondary | ICD-10-CM

## 2021-04-10 NOTE — Telephone Encounter (Addendum)
Critical [employer] result faxed to cardiology office, Sunday, December 18 th at 0806 am.  Patient seen by Dr.Branch for syncope on 03/27/21, 30 event monitor ordered  Faxed critical event reviewed by Dr.Nishan,(DOD) who requests EP evaluation this week, referral placed.   Report analysis from 04/09/21: pause (4.8, 3.1 Dec), sinus bradycardia, Sinus Arrhythmia w/1st degree AV Block/Run of V-tach (3.1 sec) /PACs  Monitor began 04/04/21  We will scan 04/07/21, and 04/09/21 report to "home monitoring" under media     Apt scheduled for 04/12/21 at 0830 with Dr.Taylor at the [address] office   LM on home and cell numbers to return call [phone] and [phone]

## 2021-04-10 NOTE — Telephone Encounter (Signed)
Attempt to reach again, no answer,no machine

## 2021-04-12 ENCOUNTER — Encounter: Payer: Self-pay | Admitting: Internal Medicine

## 2021-04-12 ENCOUNTER — Telehealth: Payer: Self-pay | Admitting: Cardiology

## 2021-04-12 ENCOUNTER — Other Ambulatory Visit: Payer: Self-pay

## 2021-04-12 ENCOUNTER — Ambulatory Visit: Payer: Medicare HMO | Admitting: Internal Medicine

## 2021-04-12 DIAGNOSIS — R55 Syncope and collapse: Secondary | ICD-10-CM | POA: Diagnosis not present

## 2021-04-12 DIAGNOSIS — M5451 Vertebrogenic low back pain: Secondary | ICD-10-CM | POA: Diagnosis not present

## 2021-04-12 NOTE — Patient Instructions (Addendum)
Medication Instructions:  Your physician recommends that you continue on your current medications as directed. Please refer to the Current Medication list given to you today.  Labwork: None ordered.  Testing/Procedures: None ordered.  Follow-Up: Your physician wants you to follow-up in: 4 weeks with Cristopher Peru, MD   May 16, 2021 at 12:15 pm at the [facility] office  Any Other Special Instructions Will Be Listed Below (If Applicable).  If you need a refill on your cardiac medications before your next appointment, please call your pharmacy.

## 2021-04-12 NOTE — Telephone Encounter (Signed)
Patient was returning phone call. Please advise

## 2021-04-12 NOTE — Telephone Encounter (Signed)
Returned call to pt. No answer. Left msg to call back.  

## 2021-04-12 NOTE — Telephone Encounter (Signed)
Follow Up:    Patient is returning Cathy's call.

## 2021-04-12 NOTE — Telephone Encounter (Signed)
Spoke to pt and verbalized that phone call yesterday per Mertha Finders, RN was for appt reminder with Dr. Cristopher Peru in [place] today.   Pt verbalized understanding

## 2021-04-12 NOTE — Telephone Encounter (Signed)
See previous note

## 2021-04-13 DIAGNOSIS — I499 Cardiac arrhythmia, unspecified: Secondary | ICD-10-CM | POA: Diagnosis not present

## 2021-04-13 DIAGNOSIS — G40909 Epilepsy, unspecified, not intractable, without status epilepticus: Secondary | ICD-10-CM | POA: Diagnosis not present

## 2021-04-13 DIAGNOSIS — M1990 Unspecified osteoarthritis, unspecified site: Secondary | ICD-10-CM | POA: Diagnosis not present

## 2021-04-13 DIAGNOSIS — G8929 Other chronic pain: Secondary | ICD-10-CM | POA: Diagnosis not present

## 2021-04-13 DIAGNOSIS — Z79899 Other long term (current) drug therapy: Secondary | ICD-10-CM | POA: Diagnosis not present

## 2021-04-13 DIAGNOSIS — K5190 Ulcerative colitis, unspecified, without complications: Secondary | ICD-10-CM | POA: Diagnosis not present

## 2021-04-13 DIAGNOSIS — R69 Illness, unspecified: Secondary | ICD-10-CM | POA: Diagnosis not present

## 2021-04-13 DIAGNOSIS — M069 Rheumatoid arthritis, unspecified: Secondary | ICD-10-CM | POA: Diagnosis not present

## 2021-04-13 DIAGNOSIS — D84821 Immunodeficiency due to drugs: Secondary | ICD-10-CM | POA: Diagnosis not present

## 2021-04-13 DIAGNOSIS — Z811 Family history of alcohol abuse and dependence: Secondary | ICD-10-CM | POA: Diagnosis not present

## 2021-04-13 DIAGNOSIS — F17210 Nicotine dependence, cigarettes, uncomplicated: Secondary | ICD-10-CM | POA: Diagnosis not present

## 2021-04-13 DIAGNOSIS — K5900 Constipation, unspecified: Secondary | ICD-10-CM | POA: Diagnosis not present

## 2021-04-17 ENCOUNTER — Other Ambulatory Visit: Payer: Self-pay | Admitting: Family Medicine

## 2021-04-17 NOTE — Progress Notes (Signed)
HPI Terry Guerrero is referred by Dr. Bobetta Lime for evaluation of syncope. He is a pleasant 60 yo man with a h/o unexplained syncope and a h/o seizures and has been placed on Keppra. He wore a cardiac monitor which demonstrated several pauses, the longest of which was almost 5 seconds. This occurred at 7:22 in the morning. The patient did not feel that and notes that he usually sleeps until 8 a.m. His syncopal episodes are characterized by no warning. He notes with one episode he was in the kitchen and went to the floor and another he was watching TV. No injury. He has not bitten his tongue and denies loss of continence. His ECG demonstrates NSR with IRBBB and first degree AV block. He notes that while he wore his heart monitor he had no symptoms.  Allergies  Allergen Reactions   Gold-Containing Drug Products    Naprosyn [Naproxen]      Current Outpatient Medications  Medication Sig Dispense Refill   HYDROcodone-acetaminophen (NORCO) 5-325 MG tablet Take 1 tablet by mouth every 6 (six) hours as needed for moderate pain. 30 tablet 0   levETIRAcetam (KEPPRA) 500 MG tablet Take 1 tablet (500 mg total) by mouth 2 (two) times daily. 60 tablet 11   sulfaSALAzine (AZULFIDINE) 500 MG tablet TAKE 2 TABS BY MOUTH TWICE DAILY LONG TERM 120 tablet 0   No current facility-administered medications for this visit.     Past Medical History:  Diagnosis Date   Arthritis    GERD (gastroesophageal reflux disease)    Rheumatoid arthritis(714.0)    Ulcerative colitis ([place])     ROS:   All systems reviewed and negative except as noted in the HPI.   Past Surgical History:  Procedure Laterality Date   ANTERIOR CRUCIATE LIGAMENT REPAIR     Left    COLONOSCOPY     HERNIA REPAIR       Family History  Problem Relation Age of Onset   Alcoholism Mother    Colon cancer Neg Hx    Esophageal cancer Neg Hx    Stomach cancer Neg Hx    Rectal cancer Neg Hx      Social History   Socioeconomic  History   Marital status: Divorced    Spouse name: Not on file   Number of children: 1   Years of education: Not on file   Highest education level: Not on file  Occupational History   Occupation: Disabled   Tobacco Use   Smoking status: Every Day    Types: Cigarettes   Smokeless tobacco: Never  Vaping Use   Vaping Use: Never used  Substance and Sexual Activity   Alcohol use: Yes    Comment: occasional   Drug use: Yes    Types: Marijuana    Comment: yesterday 01/20/21   Sexual activity: Not on file  Other Topics Concern   Not on file  Social History Narrative   Daily caffeine    Right handed    Social Determinants of Health   Financial Resource Strain: Not on file  Food Insecurity: Not on file  Transportation Needs: Not on file  Physical Activity: Not on file  Stress: Not on file  Social Connections: Not on file  Intimate Partner Violence: Not on file     BP (!) 146/80    Pulse 60    Ht 5' 6"  (1.676 m)    Wt 143 lb (64.9 kg)    SpO2 98%  BMI 23.08 kg/m   Physical Exam:  Well appearing 60 yo man, NAD HEENT: Unremarkable Neck:  No JVD, no thyromegally Lymphatics:  No adenopathy Back:  No CVA tenderness Lungs:  Clear with no wheezes HEART:  Regular rate rhythm, no murmurs, no rubs, no clicks Abd:  soft, positive bowel sounds, no organomegally, no rebound, no guarding Ext:  2 plus pulses, no edema, no cyanosis, no clubbing Skin:  No rashes no nodules Neuro:  CN II through XII intact, motor grossly intact  EKG - nsr with first degree AV block and IRBB  Assess/Plan:  Unexplained syncope - the etiology is still unclear as his heart block occurred while he was sleeping and was asymptomatic. He does have some conduction system disease. I would plan to await his 2D echo, make sure no other arrhythmias are observed, particularly those for which he is symptomatic. An ILR monitor would likely be the next step in sorting out the mechanism of his arrhythmia unless he has  something unexpected on his echo.  Carleene Overlie Hatcher Froning,MD

## 2021-04-18 MED ORDER — HYDROCODONE-ACETAMINOPHEN 5-325 MG PO TABS: 1.0000 | ORAL_TABLET | Freq: Four times a day (QID) | ORAL | 0 refills | Status: DC | PRN

## 2021-05-05 ENCOUNTER — Other Ambulatory Visit: Payer: Self-pay

## 2021-05-05 ENCOUNTER — Ambulatory Visit (HOSPITAL_COMMUNITY)
Admission: RE | Admit: 2021-05-05 | Discharge: 2021-05-05 | Disposition: A | Discharge status: Home / Self Care | Payer: Medicare HMO | Source: Ambulatory Visit | Attending: Cardiology | Admitting: Cardiology

## 2021-05-05 DIAGNOSIS — R55 Syncope and collapse: Secondary | ICD-10-CM | POA: Diagnosis not present

## 2021-05-05 LAB — ECHOCARDIOGRAM COMPLETE
AR max vel: 2.29 cm2
AV Area VTI: 2.24 cm2
AV Area mean vel: 2.19 cm2
AV Mean grad: 3 mmHg
AV Peak grad: 4.8 mmHg
Ao pk vel: 1.1 m/s
Area-P 1/2: 3.56 cm2
Calc EF: 60.1 %
MV VTI: 2.36 cm2
S' Lateral: 2.7 cm
Single Plane A2C EF: 61.3 %
Single Plane A4C EF: 60 %

## 2021-05-05 NOTE — Progress Notes (Signed)
*  PRELIMINARY RESULTS* Echocardiogram 2D Echocardiogram has been performed.  Terry Guerrero 05/05/2021, 10:07 AM

## 2021-05-07 ENCOUNTER — Other Ambulatory Visit: Payer: Self-pay | Admitting: Gastroenterology

## 2021-05-08 ENCOUNTER — Ambulatory Visit (INDEPENDENT_AMBULATORY_CARE_PROVIDER_SITE_OTHER): Payer: Medicare HMO | Admitting: Neurology

## 2021-05-08 ENCOUNTER — Encounter: Payer: Self-pay | Admitting: Family Medicine

## 2021-05-08 ENCOUNTER — Other Ambulatory Visit: Payer: Self-pay

## 2021-05-08 ENCOUNTER — Encounter: Payer: Self-pay | Admitting: Neurology

## 2021-05-08 VITALS — BP 115/66 | HR 71 | Ht 66.0 in | Wt 145.4 lb

## 2021-05-08 DIAGNOSIS — G40009 Localization-related (focal) (partial) idiopathic epilepsy and epileptic syndromes with seizures of localized onset, not intractable, without status epilepticus: Secondary | ICD-10-CM

## 2021-05-08 MED ORDER — LEVETIRACETAM 500 MG PO TABS: 500.0000 mg | ORAL_TABLET | Freq: Two times a day (BID) | ORAL | 3 refills | Status: DC

## 2021-05-08 NOTE — Patient Instructions (Signed)
Good to see you. Continue Keppra 547m twice a day. Follow-up with Cardiology as scheduled. Follow-up in 6 months, call for any changes.    Seizure Precautions: 1. If medication has been prescribed for you to prevent seizures, take it exactly as directed.  Do not stop taking the medicine without talking to your doctor first, even if you have not had a seizure in a long time.   2. Avoid activities in which a seizure would cause danger to yourself or to others.  Don't operate dangerous machinery, swim alone, or climb in high or dangerous places, such as on ladders, roofs, or girders.  Do not drive unless your doctor says you may.  3. If you have any warning that you may have a seizure, lay down in a safe place where you can't hurt yourself.    4.  No driving for 6 months from last seizure, [facility]   Please refer to the following link on the EPavowebsite for more information: http://www.epilepsyfoundation.org/answerplace/Social/driving/drivingu.cfm   5.  Maintain good sleep hygiene. Avoid alcohol.  6.  Contact your doctor if you have any problems that may be related to the medicine you are taking.  7.  Call 911 and bring the patient back to the ED if:        A.  The seizure lasts longer than 5 minutes.       B.  The patient doesn't awaken shortly after the seizure  C.  The patient has new problems such as difficulty seeing, speaking or moving  D.  The patient was injured during the seizure  E.  The patient has a temperature over 102 F (39C)  F.  The patient vomited and now is having trouble breathing
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
## 2021-05-10 ENCOUNTER — Telehealth: Payer: Self-pay

## 2021-05-10 ENCOUNTER — Ambulatory Visit: Payer: Medicare HMO | Admitting: Gastroenterology

## 2021-05-10 ENCOUNTER — Encounter: Payer: Self-pay | Admitting: Gastroenterology

## 2021-05-10 VITALS — BP 108/74 | HR 87 | Ht 66.0 in | Wt 144.8 lb

## 2021-05-10 DIAGNOSIS — Z86010 Personal history of colonic polyps: Secondary | ICD-10-CM | POA: Diagnosis not present

## 2021-05-10 DIAGNOSIS — K5100 Ulcerative (chronic) pancolitis without complications: Secondary | ICD-10-CM | POA: Diagnosis not present

## 2021-05-10 DIAGNOSIS — Z860101 Personal history of adenomatous and serrated colon polyps: Secondary | ICD-10-CM

## 2021-05-10 MED ORDER — FOLIC ACID 1 MG PO TABS: 1.0000 mg | ORAL_TABLET | Freq: Every day | ORAL | 11 refills | Status: DC

## 2021-05-10 MED ORDER — SULFASALAZINE 500 MG PO TABS: ORAL_TABLET | ORAL | 11 refills | Status: DC

## 2021-05-10 NOTE — Progress Notes (Signed)
° ° °  History of Present Illness: This is a 61 year old male with ulcerative pancolitis diagnosed in January 2021.  His last office visit was in November 2021.  After not having success in finding a 5-ASA medication that was covered by his insurance provider he has been maintained on sulfasalazine.  He relates a small amounts of bright red blood per rectum with bowel movements 2-3 times per month.  He has no diarrhea, mucus per rectum or abdominal pain. Bowel movement are well formed. His weight is stable.   Current Medications, Allergies, Past Medical History, Past Surgical History, Family History and Social History were reviewed in [employer] record.   Physical Exam: General: Well developed, well nourished, no acute distress Head: Normocephalic and atraumatic Eyes: Sclerae anicteric, EOMI Ears: Normal auditory acuity Mouth: Not examined, mask on during Covid-19 pandemic Lungs: Clear throughout to auscultation Heart: Regular rate and rhythm; no murmurs, rubs or bruits Abdomen: Soft, non tender and non distended. No masses, hepatosplenomegaly or hernias noted. Normal Bowel sounds Rectal: Not done Musculoskeletal: Symmetrical with no gross deformities  Pulses:  Normal pulses noted Extremities: No clubbing, cyanosis, edema or deformities noted Neurological: Alert oriented x 4, grossly nonfocal Psychological:  Alert and cooperative. Normal mood and affect   Assessment and Recommendations:  Ulcerative pancolitis maintained on sulfasalazine 1 g p.o. twice daily.  Intermittent small-volume rectal bleeding.  BMET and CBC from October 2022 reviewed.  Continue sulfasalazine 1 g p.o. twice daily.  Begin Folate 1 mg po qd long term. He is advised to call if his rectal bleeding worsens or he develops worsening problems with diarrhea or mucus per rectum. Surveillance colonoscopy recommended in January 2027. REV in 1 year. Personal history of 2 small adenomatous colon polyps.   Surveillance colonoscopy as above.

## 2021-05-10 NOTE — Telephone Encounter (Signed)
Left message for patient to return my call.

## 2021-05-10 NOTE — Telephone Encounter (Signed)
Patient return call

## 2021-05-10 NOTE — Patient Instructions (Signed)
We have sent the following medications to your pharmacy for you to pick up at your convenience: sulfasalazine.   The [facility] providers would like to encourage you to use [facility] to communicate with providers for non-urgent requests or questions.  Due to long hold times on the telephone, sending your provider a message by [facility] may be a faster and more efficient way to get a response.  Please allow 48 business hours for a response.  Please remember that this is for non-urgent requests.   Thank you for choosing me and [facility].  Pricilla Riffle. Dagoberto Ligas., MD., Marval Regal

## 2021-05-11 NOTE — Telephone Encounter (Signed)
Left message for patient to return my call.

## 2021-05-11 NOTE — Telephone Encounter (Signed)
Informed patient that while he is taking sulfasalazine he does need to be on folic acid 1 mg daily. Informed patient I sent a prescription to his pharmacy. Patient verbalized understanding.

## 2021-05-12 ENCOUNTER — Ambulatory Visit (INDEPENDENT_AMBULATORY_CARE_PROVIDER_SITE_OTHER): Payer: Medicare HMO

## 2021-05-12 ENCOUNTER — Encounter: Payer: Self-pay | Admitting: Family Medicine

## 2021-05-12 ENCOUNTER — Other Ambulatory Visit: Payer: Self-pay

## 2021-05-12 VITALS — Ht 66.0 in | Wt 144.0 lb

## 2021-05-12 DIAGNOSIS — Z0000 Encounter for general adult medical examination without abnormal findings: Secondary | ICD-10-CM | POA: Diagnosis not present

## 2021-05-12 NOTE — Patient Instructions (Signed)
Terry Guerrero , Thank you for taking time to come for your Medicare Wellness Visit. I appreciate your ongoing commitment to your health goals. Please review the following plan we discussed and let me know if I can assist you in the future.   Screening recommendations/referrals: Colonoscopy: Done 05/14/2019 Repeat in 5 years  Recommended yearly ophthalmology/optometry visit for glaucoma screening and checkup Recommended yearly dental visit for hygiene and checkup  Vaccinations: Influenza vaccine: Declines. Pneumococcal vaccine: Declines. Tdap vaccine: Due. Repeat in 10 years  Shingles vaccine: Declines.   Covid-19: Declines.  Advanced directives: Advance directive discussed with you today. Even though you declined this today, please call our office should you change your mind, and we can give you the proper paperwork for you to fill out.   Conditions/risks identified: Aim for 30 minutes of exercise or brisk walking each day, drink 6-8 glasses of water and eat lots of fruits and vegetables.   Next appointment: Follow up in one year for your annual wellness visit 2024.   Preventive Care 40-64 Years, Male Preventive care refers to lifestyle choices and visits with your health care provider that can promote health and wellness. What does preventive care include? A yearly physical exam. This is also called an annual well check. Dental exams once or twice a year. Routine eye exams. Ask your health care provider how often you should have your eyes checked. Personal lifestyle choices, including: Daily care of your teeth and gums. Regular physical activity. Eating a healthy diet. Avoiding tobacco and drug use. Limiting alcohol use. Practicing safe sex. Taking low-dose aspirin every day starting at age 55. What happens during an annual well check? The services and screenings done by your health care provider during your annual well check will depend on your age, overall health, lifestyle risk  factors, and family history of disease. Counseling  Your health care provider may ask you questions about your: Alcohol use. Tobacco use. Drug use. Emotional well-being. Home and relationship well-being. Sexual activity. Eating habits. Work and work Statistician. Screening  You may have the following tests or measurements: Height, weight, and BMI. Blood pressure. Lipid and cholesterol levels. These may be checked every 5 years, or more frequently if you are over 42 years old. Skin check. Lung cancer screening. You may have this screening every year starting at age 31 if you have a 30-pack-year history of smoking and currently smoke or have quit within the past 15 years. Fecal occult blood test (FOBT) of the stool. You may have this test every year starting at age 34. Flexible sigmoidoscopy or colonoscopy. You may have a sigmoidoscopy every 5 years or a colonoscopy every 10 years starting at age 88. Prostate cancer screening. Recommendations will vary depending on your family history and other risks. Hepatitis C blood test. Hepatitis B blood test. Sexually transmitted disease (STD) testing. Diabetes screening. This is done by checking your blood sugar (glucose) after you have not eaten for a while (fasting). You may have this done every 1-3 years. Discuss your test results, treatment options, and if necessary, the need for more tests with your health care provider. Vaccines  Your health care provider may recommend certain vaccines, such as: Influenza vaccine. This is recommended every year. Tetanus, diphtheria, and acellular pertussis (Tdap, Td) vaccine. You may need a Td booster every 10 years. Zoster vaccine. You may need this after age 3. Pneumococcal 13-valent conjugate (PCV13) vaccine. You may need this if you have certain conditions and have not been vaccinated. Pneumococcal  polysaccharide (PPSV23) vaccine. You may need one or two doses if you smoke cigarettes or if you have  certain conditions. Talk to your health care provider about which screenings and vaccines you need and how often you need them. This information is not intended to replace advice given to you by your health care provider. Make sure you discuss any questions you have with your health care provider. Document Released: 05/06/2015 Document Revised: 12/28/2015 Document Reviewed: 02/08/2015 Elsevier Interactive Patient Education  2017 [place] Prevention in the Home Falls can cause injuries. They can happen to people of all ages. There are many things you can do to make your home safe and to help prevent falls. What can I do on the outside of my home? Regularly fix the edges of walkways and driveways and fix any cracks. Remove anything that might make you trip as you walk through a door, such as a raised step or threshold. Trim any bushes or trees on the path to your home. Use bright outdoor lighting. Clear any walking paths of anything that might make someone trip, such as rocks or tools. Regularly check to see if handrails are loose or broken. Make sure that both sides of any steps have handrails. Any raised decks and porches should have guardrails on the edges. Have any leaves, snow, or ice cleared regularly. Use sand or salt on walking paths during winter. Clean up any spills in your garage right away. This includes oil or grease spills. What can I do in the bathroom? Use night lights. Install grab bars by the toilet and in the tub and shower. Do not use towel bars as grab bars. Use non-skid mats or decals in the tub or shower. If you need to sit down in the shower, use a plastic, non-slip stool. Keep the floor dry. Clean up any water that spills on the floor as soon as it happens. Remove soap buildup in the tub or shower regularly. Attach bath mats securely with double-sided non-slip rug tape. Do not have throw rugs and other things on the floor that can make you trip. What can  I do in the bedroom? Use night lights. Make sure that you have a light by your bed that is easy to reach. Do not use any sheets or blankets that are too big for your bed. They should not hang down onto the floor. Have a firm chair that has side arms. You can use this for support while you get dressed. Do not have throw rugs and other things on the floor that can make you trip. What can I do in the kitchen? Clean up any spills right away. Avoid walking on wet floors. Keep items that you use a lot in easy-to-reach places. If you need to reach something above you, use a strong step stool that has a grab bar. Keep electrical cords out of the way. Do not use floor polish or wax that makes floors slippery. If you must use wax, use non-skid floor wax. Do not have throw rugs and other things on the floor that can make you trip. What can I do with my stairs? Do not leave any items on the stairs. Make sure that there are handrails on both sides of the stairs and use them. Fix handrails that are broken or loose. Make sure that handrails are as long as the stairways. Check any carpeting to make sure that it is firmly attached to the stairs. Fix any carpet that is  loose or worn. Avoid having throw rugs at the top or bottom of the stairs. If you do have throw rugs, attach them to the floor with carpet tape. Make sure that you have a light switch at the top of the stairs and the bottom of the stairs. If you do not have them, ask someone to add them for you. What else can I do to help prevent falls? Wear shoes that: Do not have high heels. Have rubber bottoms. Are comfortable and fit you well. Are closed at the toe. Do not wear sandals. If you use a stepladder: Make sure that it is fully opened. Do not climb a closed stepladder. Make sure that both sides of the stepladder are locked into place. Ask someone to hold it for you, if possible. Clearly mark and make sure that you can see: Any grab bars or  handrails. First and last steps. Where the edge of each step is. Use tools that help you move around (mobility aids) if they are needed. These include: Canes. Walkers. Scooters. Crutches. Turn on the lights when you go into a dark area. Replace any light bulbs as soon as they burn out. Set up your furniture so you have a clear path. Avoid moving your furniture around. If any of your floors are uneven, fix them. If there are any pets around you, be aware of where they are. Review your medicines with your doctor. Some medicines can make you feel dizzy. This can increase your chance of falling. Ask your doctor what other things that you can do to help prevent falls. This information is not intended to replace advice given to you by your health care provider. Make sure you discuss any questions you have with your health care provider. Document Released: 02/03/2009 Document Revised: 09/15/2015 Document Reviewed: 05/14/2014 Elsevier Interactive Patient Education  2017 [employer].

## 2021-05-12 NOTE — Progress Notes (Signed)
Subjective:   Terry Guerrero is a 61 y.o. male who presents for an Initial Medicare Annual Wellness Visit. Virtual Visit via Telephone Note  I connected with  Terry Guerrero on 05/12/21 at  9:00 AM EST by telephone and verified that I am speaking with the correct person using two identifiers.  Location: Patient: HOME Provider: [facility] Persons participating in the virtual visit: patient/Nurse Health Advisor   I discussed the limitations, risks, security and privacy concerns of performing an evaluation and management service by telephone and the availability of in person appointments. The patient expressed understanding and agreed to proceed.  Interactive audio and video telecommunications were attempted between this nurse and patient, however failed, due to patient having technical difficulties OR patient did not have access to video capability.  We continued and completed visit with audio only.  Some vital signs may be absent or patient reported.   [name], LPN  Review of Systems     Cardiac Risk Factors include: advanced age (>44mn, >>79women);male gender;smoking/ tobacco exposure;Other (see comment), Risk factor comments: Seizure disorder.  PHONE VISIT. PT AT HOME. NURSE AT [facility].    Objective:    Today's Vitals   05/12/21 0857 05/12/21 0858  Weight: 144 lb (65.3 kg)   Height: 5' 6"  (1.676 m)   PainSc:  2    Body mass index is 23.24 kg/m.  Advanced Directives 05/12/2021 05/08/2021 01/27/2021 01/21/2021  Does Patient Have a Medical Advance Directive? No No No No  Would patient like information on creating a medical advance directive? No - Patient declined - - No - Patient declined    Current Medications (verified) Outpatient Encounter Medications as of 05/12/2021  Medication Sig   folic acid (FOLVITE) 1 MG tablet Take 1 tablet (1 mg total) by mouth daily.   HYDROcodone-acetaminophen (NORCO) 5-325 MG tablet Take 1 tablet by mouth every 6 (six) hours as needed for  moderate pain.   levETIRAcetam (KEPPRA) 500 MG tablet Take 1 tablet (500 mg total) by mouth 2 (two) times daily.   sulfaSALAzine (AZULFIDINE) 500 MG tablet TAKE 2 TABLETS BY MOUTH TWICE DAILY   No facility-administered encounter medications on file as of 05/12/2021.    Allergies (verified) Gold-containing drug products and Naprosyn [naproxen]   History: Past Medical History:  Diagnosis Date   Arthritis    GERD (gastroesophageal reflux disease)    Rheumatoid arthritis(714.0)    Seizures ([facility] 12/2020   [place]    Past Surgical History:  Procedure Laterality Date   ANTERIOR CRUCIATE LIGAMENT REPAIR     Left knee   COLONOSCOPY     HERNIA REPAIR  2020   Family History  Problem Relation Age of Onset   Alcoholism Mother    Colon cancer Neg Hx    Esophageal cancer Neg Hx    Stomach cancer Neg Hx    Rectal cancer Neg Hx    Social History   Socioeconomic History   Marital status: Divorced    Spouse name: Not on file   Number of children: 1   Years of education: Not on file   Highest education level: Not on file  Occupational History   Occupation: Disabled   Tobacco Use   Smoking status: Every Day    Packs/day: 1.00    Years: 25.00    Pack years: 25.00    Types: Cigarettes   Smokeless tobacco: Never  Vaping Use   Vaping Use: Never used  Substance and Sexual Activity  Alcohol use: Not Currently    Comment: occasional   Drug use: Yes    Frequency: 12.0 times per week    Types: Hydrocodone, Marijuana    Comment: daily   Sexual activity: Yes    Birth control/protection: None  Other Topics Concern   Not on file  Social History Narrative   Daily caffeine    Right handed    Lives with girlfriend.    1 son.    Social Determinants of Health   Financial Resource Strain: Low Risk    Difficulty of Paying Living Expenses: Not very hard  Food Insecurity: No Food Insecurity   Worried About Charity fundraiser in the Last Year: Never true   Ran Out  of Food in the Last Year: Never true  Transportation Needs: No Transportation Needs   Lack of Transportation (Medical): No   Lack of Transportation (Non-Medical): No  Physical Activity: Sufficiently Active   Days of Exercise per Week: 5 days   Minutes of Exercise per Session: 30 min  Stress: No Stress Concern Present   Feeling of Stress : Not at all  Social Connections: Moderately Isolated   Frequency of Communication with Friends and Family: More than three times a week   Frequency of Social Gatherings with Friends and Family: More than three times a week   Attends Religious Services: Never   Marine scientist or Organizations: No   Attends Music therapist: Never   Marital Status: Living with partner    Tobacco Counseling Ready to quit: No Counseling given: Not Answered   Clinical Intake:  Pre-visit preparation completed: Yes  Pain : 0-10 Pain Score: 2  Pain Type: Chronic pain Pain Location: Back Pain Descriptors / Indicators: Aching Pain Onset: More than a month ago Pain Frequency: Intermittent     BMI - recorded: 23.24 Nutritional Status: BMI of 19-24  Normal Nutritional Risks: None Diabetes: No  How often do you need to have someone help you when you read instructions, pamphlets, or other written materials from your doctor or pharmacy?: 1 - Never  Diabetic?NO  Interpreter Needed?: No  Information entered by :: MJ Illyana Schorsch, LPN   Activities of Daily Living In your present state of health, do you have any difficulty performing the following activities: 05/12/2021  Hearing? N  Vision? N  Difficulty concentrating or making decisions? N  Walking or climbing stairs? N  Dressing or bathing? N  Doing errands, shopping? N  Preparing Food and eating ? N  Using the Toilet? N  In the past six months, have you accidently leaked urine? N  Do you have problems with loss of bowel control? N  Managing your Medications? N  Managing your Finances? N   Housekeeping or managing your Housekeeping? N  Some recent data might be hidden    Patient Care Team: Susy Frizzle, MD as PCP - General (Family Medicine) Cameron Sprang, MD as Consulting Physician (Neurology)  Indicate any recent Medical Services you may have received from other than [facility] providers in the past year (date may be approximate).     Assessment:   This is a routine wellness examination for [place].  Hearing/Vision screen Hearing Screening - Comments:: No hearing issues.  Vision Screening - Comments:: Readers.   Dietary issues and exercise activities discussed: Current Exercise Habits: Home exercise routine, Type of exercise: walking, Time (Minutes): 30, Frequency (Times/Week): 5, Weekly Exercise (Minutes/Week): 150, Intensity: Mild, Exercise limited by: neurologic condition(s)   Goals  Addressed   None    Depression Screen PHQ 2/9 Scores 05/12/2021 01/03/2021 07/19/2017  PHQ - 2 Score 0 0 0    Fall Risk Fall Risk  05/12/2021 05/08/2021 01/27/2021 01/03/2021  Falls in the past year? 1 1 1  0  Number falls in past yr: 1 1 1  0  Injury with Fall? 0 0 0 0  Risk for fall due to : History of fall(s);Impaired balance/gait;Impaired mobility - - -  Follow up Falls prevention discussed - - Falls evaluation completed    FALL RISK PREVENTION PERTAINING TO THE HOME:  Any stairs in or around the home? Yes  If so, are there any without handrails? No  Home free of loose throw rugs in walkways, pet beds, electrical cords, etc? Yes  Adequate lighting in your home to reduce risk of falls? Yes   ASSISTIVE DEVICES UTILIZED TO PREVENT FALLS:  Life alert? No  Use of a cane, walker or w/c? No  Grab bars in the bathroom? Yes  Shower chair or bench in shower? Yes  Elevated toilet seat or a handicapped toilet? No   TIMED UP AND GO:  Was the test performed? No .    Cognitive Function:     6CIT Screen 05/12/2021  What Year? 0 points  What month? 0 points  What time? 0  points  Count back from 20 0 points  Months in reverse 2 points  Repeat phrase 0 points  Total Score 2    Immunizations  There is no immunization history on file for this patient.  TDAP status: Due, Education has been provided regarding the importance of this vaccine. Advised may receive this vaccine at local pharmacy or Health Dept. Aware to provide a copy of the vaccination record if obtained from local pharmacy or Health Dept. Verbalized acceptance and understanding.  Flu Vaccine status: Declined, Education has been provided regarding the importance of this vaccine but patient still declined. Advised may receive this vaccine at local pharmacy or Health Dept. Aware to provide a copy of the vaccination record if obtained from local pharmacy or Health Dept. Verbalized acceptance and understanding.  Pneumococcal vaccine status: Declined,  Education has been provided regarding the importance of this vaccine but patient still declined. Advised may receive this vaccine at local pharmacy or Health Dept. Aware to provide a copy of the vaccination record if obtained from local pharmacy or Health Dept. Verbalized acceptance and understanding.   Covid-19 vaccine status: Declined, Education has been provided regarding the importance of this vaccine but patient still declined. Advised may receive this vaccine at local pharmacy or Health Dept.or vaccine clinic. Aware to provide a copy of the vaccination record if obtained from local pharmacy or Health Dept. Verbalized acceptance and understanding.  Qualifies for Shingles Vaccine? Yes   Zostavax completed No   Shingrix Completed?: No.    Education has been provided regarding the importance of this vaccine. Patient has been advised to call insurance company to determine out of pocket expense if they have not yet received this vaccine. Advised may also receive vaccine at local pharmacy or Health Dept. Verbalized acceptance and understanding.  Screening  Tests Health Maintenance  Topic Date Due   Hepatitis C Screening  Never done   TETANUS/TDAP  Never done   COLONOSCOPY (Pts 45-79yr Insurance coverage will need to be confirmed)  05/13/2024   HPV VACCINES  Aged Out   INFLUENZA VACCINE  Discontinued   COVID-19 Vaccine  Discontinued   HIV Screening  Discontinued  Zoster Vaccines- Shingrix  Discontinued    Health Maintenance  Health Maintenance Due  Topic Date Due   Hepatitis C Screening  Never done   TETANUS/TDAP  Never done    Colorectal cancer screening: Type of screening: Colonoscopy. Completed 05/14/2019. Repeat every 5 years  Lung Cancer Screening: (Low Dose CT Chest recommended if Age 88-80 years, 30 pack-year currently smoking OR have quit w/in 15years.) does qualify.   Lung Cancer Screening Referral: Pt declined.  Additional Screening:  Hepatitis C Screening: does qualify; Completed Due  Vision Screening: Recommended annual ophthalmology exams for early detection of glaucoma and other disorders of the eye. Is the patient up to date with their annual eye exam?  No  Who is the provider or what is the name of the office in which the patient attends annual eye exams? N/A If pt is not established with a provider, would they like to be referred to a provider to establish care? No .   Dental Screening: Recommended annual dental exams for proper oral hygiene  Community Resource Referral / Chronic Care Management: CRR required this visit?  No   CCM required this visit?  No      Plan:     I have personally reviewed and noted the following in the patients chart:   Medical and social history Use of alcohol, tobacco or illicit drugs  Current medications and supplements including opioid prescriptions. Patient is currently taking opioid prescriptions. Information provided to patient regarding non-opioid alternatives. Patient advised to discuss non-opioid treatment plan with their provider. Functional ability and  status Nutritional status Physical activity Advanced directives List of other physicians Hospitalizations, surgeries, and ER visits in previous 12 months Vitals Screenings to include cognitive, depression, and falls Referrals and appointments  In addition, I have reviewed and discussed with patient certain preventive protocols, quality metrics, and best practice recommendations. A written personalized care plan for preventive services as well as general preventive health recommendations were provided to patient.     [name], LPN   [id number]/42/[id number]   Nurse Notes: Pt declined Optometry referral and Lung Ca Screen at this time. Pt declined all vaccines. Currently undergoing testing due to recent seizure 12/2020.

## 2021-05-16 ENCOUNTER — Ambulatory Visit: Payer: Medicare HMO | Admitting: Internal Medicine

## 2021-05-16 ENCOUNTER — Encounter: Payer: Self-pay | Admitting: Internal Medicine

## 2021-05-16 ENCOUNTER — Other Ambulatory Visit: Payer: Self-pay

## 2021-05-16 VITALS — BP 118/68 | HR 77 | Ht 66.0 in | Wt 143.2 lb

## 2021-05-16 DIAGNOSIS — R55 Syncope and collapse: Secondary | ICD-10-CM | POA: Diagnosis not present

## 2021-05-16 NOTE — Patient Instructions (Addendum)
Medication Instructions:  Your physician recommends that you continue on your current medications as directed. Please refer to the Current Medication list given to you today.  Labwork: None ordered.  Testing/Procedures: None ordered.  Follow-Up: Your physician wants you to follow-up in: March 2023 with Cristopher Peru, MD in [place].   Any Other Special Instructions Will Be Listed Below (If Applicable).  If you need a refill on your cardiac medications before your next appointment, please call your pharmacy.

## 2021-05-16 NOTE — Progress Notes (Signed)
HPI Terry Guerrero returns today for followup. He is a pleasant middle aged man with syncope who wore a cardiac monitor which demonstrated daytime pauses of up to 5 seconds for which he denies symptoms. He has not been inclined to undergo PPM insertion. He also has a h/o tobacco abuse. He denies chest pain or sob. He is still smoking.  Allergies  Allergen Reactions   Gold-Containing Drug Products    Naprosyn [Naproxen]      Current Outpatient Medications  Medication Sig Dispense Refill   folic acid (FOLVITE) 1 MG tablet Take 1 tablet (1 mg total) by mouth daily. 30 tablet 11   HYDROcodone-acetaminophen (NORCO) 5-325 MG tablet Take 1 tablet by mouth every 6 (six) hours as needed for moderate pain. 30 tablet 0   levETIRAcetam (KEPPRA) 500 MG tablet Take 1 tablet (500 mg total) by mouth 2 (two) times daily. 180 tablet 3   sulfaSALAzine (AZULFIDINE) 500 MG tablet TAKE 2 TABLETS BY MOUTH TWICE DAILY 120 tablet 11   No current facility-administered medications for this visit.     Past Medical History:  Diagnosis Date   Arthritis    GERD (gastroesophageal reflux disease)    Rheumatoid arthritis(714.0)    Seizures ([place]) 12/2020   Ulcerative colitis ([place])     ROS:   All systems reviewed and negative except as noted in the HPI.   Past Surgical History:  Procedure Laterality Date   ANTERIOR CRUCIATE LIGAMENT REPAIR     Left knee   COLONOSCOPY     HERNIA REPAIR  2020     Family History  Problem Relation Age of Onset   Alcoholism Mother    Colon cancer Neg Hx    Esophageal cancer Neg Hx    Stomach cancer Neg Hx    Rectal cancer Neg Hx      Social History   Socioeconomic History   Marital status: Divorced    Spouse name: Not on file   Number of children: 1   Years of education: Not on file   Highest education level: Not on file  Occupational History   Occupation: Disabled   Tobacco Use   Smoking status: Every Day    Packs/day: 1.00    Years: 25.00    Pack  years: 25.00    Types: Cigarettes   Smokeless tobacco: Never  Vaping Use   Vaping Use: Never used  Substance and Sexual Activity   Alcohol use: Not Currently    Comment: occasional   Drug use: Yes    Frequency: 12.0 times per week    Types: Hydrocodone, Marijuana    Comment: daily   Sexual activity: Yes    Birth control/protection: None  Other Topics Concern   Not on file  Social History Narrative   Daily caffeine    Right handed    Lives with girlfriend.    1 son.    Social Determinants of Health   Financial Resource Strain: Low Risk    Difficulty of Paying Living Expenses: Not very hard  Food Insecurity: No Food Insecurity   Worried About Charity fundraiser in the Last Year: Never true   Ran Out of Food in the Last Year: Never true  Transportation Needs: No Transportation Needs   Lack of Transportation (Medical): No   Lack of Transportation (Non-Medical): No  Physical Activity: Sufficiently Active   Days of Exercise per Week: 5 days   Minutes of Exercise per Session: 30 min  Stress:  No Stress Concern Present   Feeling of Stress : Not at all  Social Connections: Moderately Isolated   Frequency of Communication with Friends and Family: More than three times a week   Frequency of Social Gatherings with Friends and Family: More than three times a week   Attends Religious Services: Never   Marine scientist or Organizations: No   Attends Music therapist: Never   Marital Status: Living with partner  Intimate Partner Violence: Not At Risk   Fear of Current or Ex-Partner: No   Emotionally Abused: No   Physically Abused: No   Sexually Abused: No     BP 118/68    Pulse 77    Ht 5' 6"  (1.676 m)    Wt 143 lb 3.2 oz (65 kg)    SpO2 98%    BMI 23.11 kg/m   Physical Exam:  Well appearing NAD HEENT: Unremarkable Neck:  No JVD, no thyromegally Lymphatics:  No adenopathy Back:  No CVA tenderness Lungs:  Clear with no wheezes HEART:  Regular rate  rhythm, no murmurs, no rubs, no clicks Abd:  soft, positive bowel sounds, no organomegally, no rebound, no guarding Ext:  2 plus pulses, no edema, no cyanosis, no clubbing Skin:  No rashes no nodules Neuro:  CN II through XII intact, motor grossly intact  Cardiac monitor - reviewed with the patient extensively  Assess/Plan:  Syncope - I suspect that sinus node dysfunction is the likely cause of his symptoms but we cannot be certain. I offered him both PPM insertion or ILR insertion. He would like to undergo watchful waiting.  HTN - his bp is controlled. Tobacco abuse - he is encouraged to stop smoking  [name]

## 2021-05-29 ENCOUNTER — Telehealth: Payer: Self-pay | Admitting: *Deleted

## 2021-05-29 ENCOUNTER — Encounter: Payer: Self-pay | Admitting: *Deleted

## 2021-05-29 NOTE — Telephone Encounter (Signed)
Laurine Blazer, LPN  0/11/6759  9:50 PM EST Back to Top    Notified via my chart.  Copy to pcp.

## 2021-05-29 NOTE — Telephone Encounter (Signed)
-----   Message from Arnoldo Lenis, MD sent at 05/28/2021  8:52 AM EST ----- Normal echo   Zandra Abts MD

## 2021-06-10 ENCOUNTER — Other Ambulatory Visit: Payer: Self-pay | Admitting: Family Medicine

## 2021-06-12 MED ORDER — HYDROCODONE-ACETAMINOPHEN 5-325 MG PO TABS: 1.0000 | ORAL_TABLET | Freq: Four times a day (QID) | ORAL | 0 refills | Status: DC | PRN

## 2021-06-12 NOTE — Telephone Encounter (Signed)
LOV 01/23/21 Last refill 04/18/21, #30, 0 refills  Please review, thanks!

## 2021-06-14 ENCOUNTER — Telehealth: Payer: Self-pay

## 2021-06-14 NOTE — Telephone Encounter (Signed)
Per [facility] Norco 5/325 is on backorder and they have no expected arrival date.  Please advise on alternative, thanks!

## 2021-06-15 ENCOUNTER — Other Ambulatory Visit: Payer: Self-pay | Admitting: Family Medicine

## 2021-06-15 MED ORDER — HYDROCODONE-ACETAMINOPHEN 7.5-325 MG PO TABS: 1.0000 | ORAL_TABLET | Freq: Four times a day (QID) | ORAL | 0 refills | Status: DC | PRN

## 2021-07-02 ENCOUNTER — Emergency Department (HOSPITAL_COMMUNITY): Payer: Medicare HMO

## 2021-07-02 ENCOUNTER — Other Ambulatory Visit: Payer: Self-pay

## 2021-07-02 ENCOUNTER — Emergency Department (HOSPITAL_COMMUNITY)
Admission: EM | Admit: 2021-07-02 | Discharge: 2021-07-02 | Disposition: A | Discharge status: Home / Self Care | Payer: Medicare HMO | Attending: Emergency Medicine | Admitting: Emergency Medicine

## 2021-07-02 DIAGNOSIS — M25462 Effusion, left knee: Secondary | ICD-10-CM | POA: Diagnosis not present

## 2021-07-02 DIAGNOSIS — W010XXA Fall on same level from slipping, tripping and stumbling without subsequent striking against object, initial encounter: Secondary | ICD-10-CM | POA: Diagnosis not present

## 2021-07-02 DIAGNOSIS — M25562 Pain in left knee: Secondary | ICD-10-CM

## 2021-07-02 IMAGING — DX DG KNEE COMPLETE 4+V*L*
4 series · 4 of 4 positions shown · non-contrast
Comparison: None.

CLINICAL DATA: Fall, pain for 3 weeks

EXAM:
LEFT KNEE - COMPLETE 4+ VIEW

[knee ap]
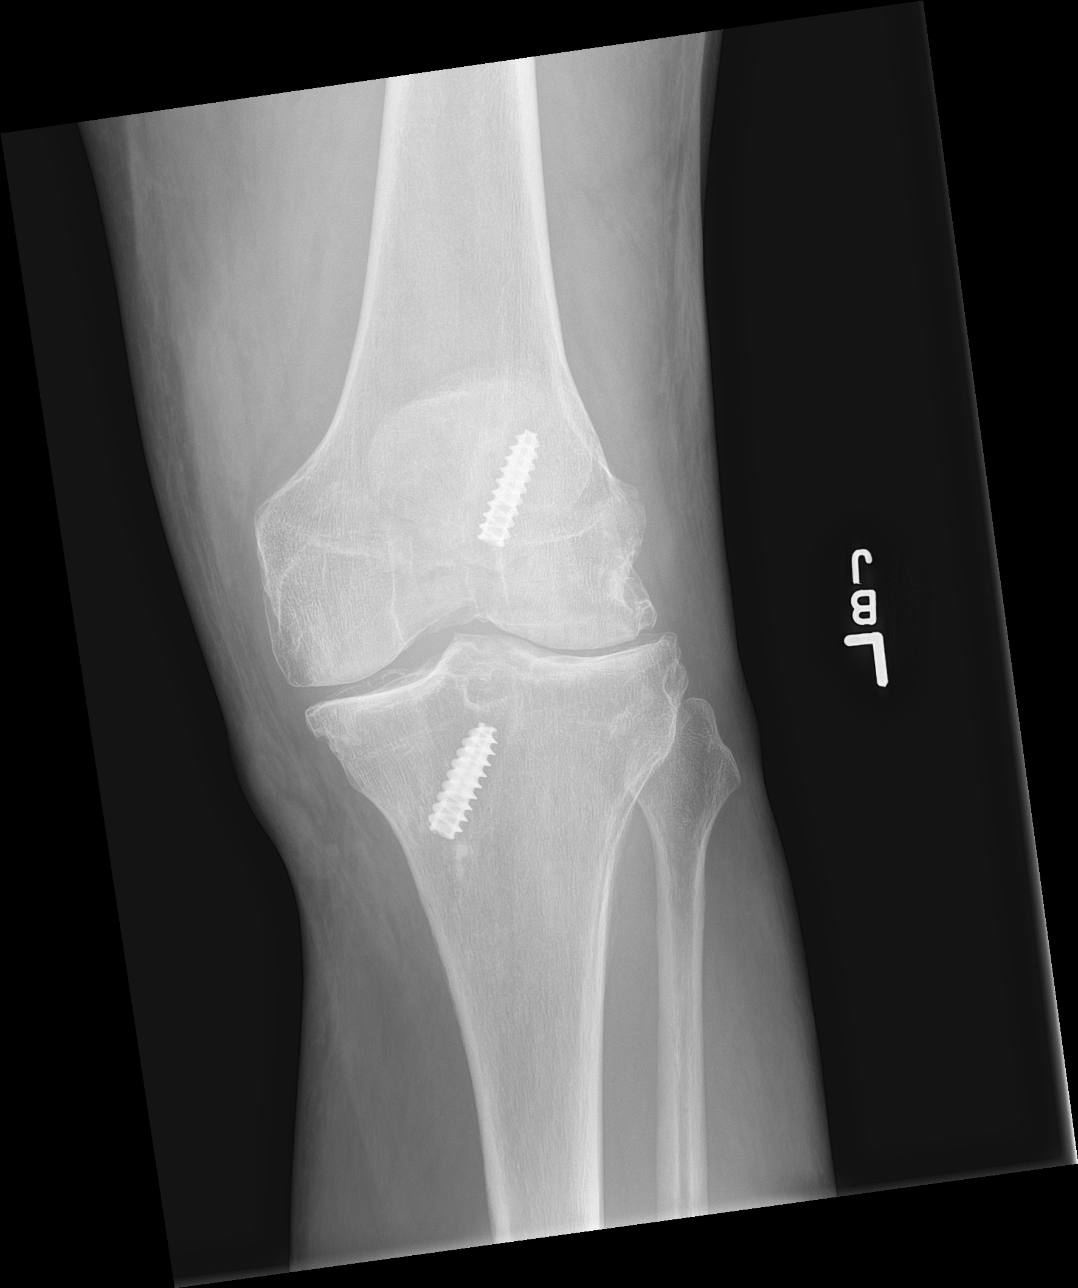

[knee obl (1 of 2)]
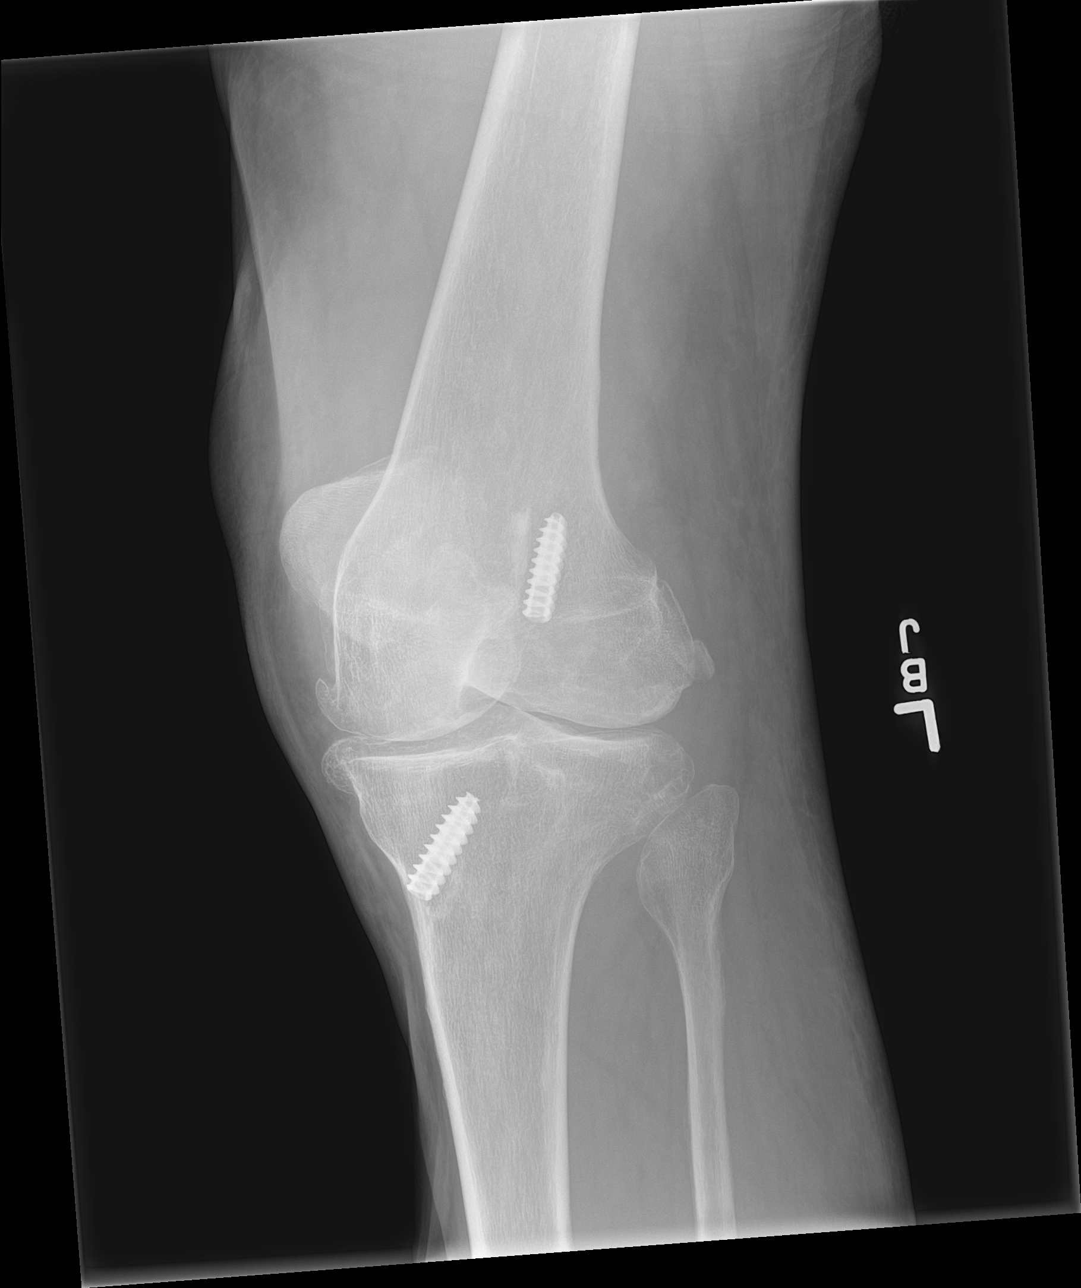

[knee obl (2 of 2)]
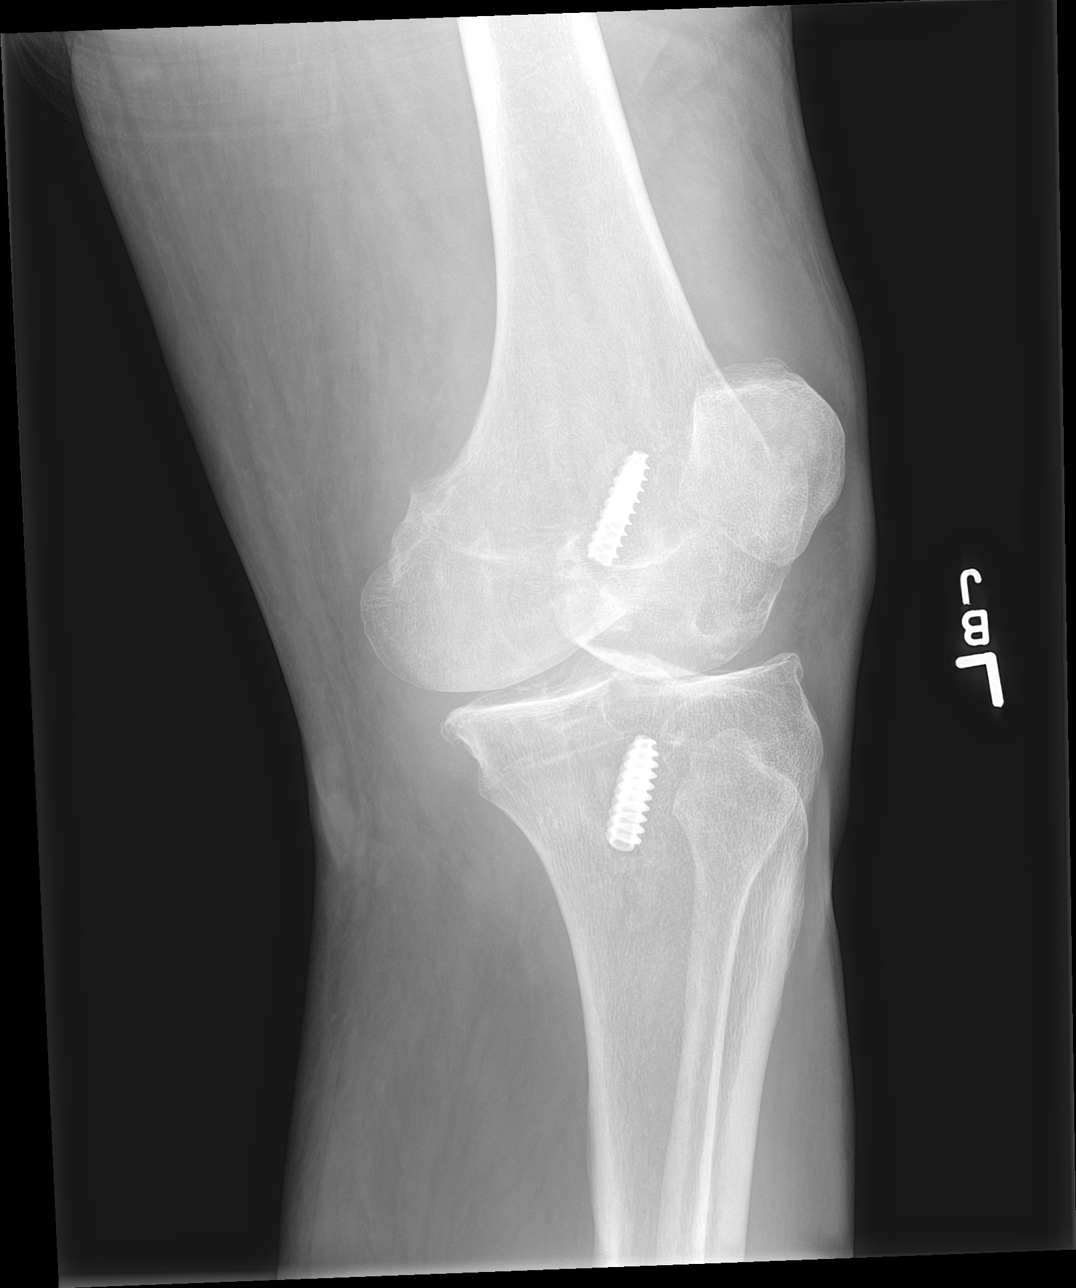

[knee lat]
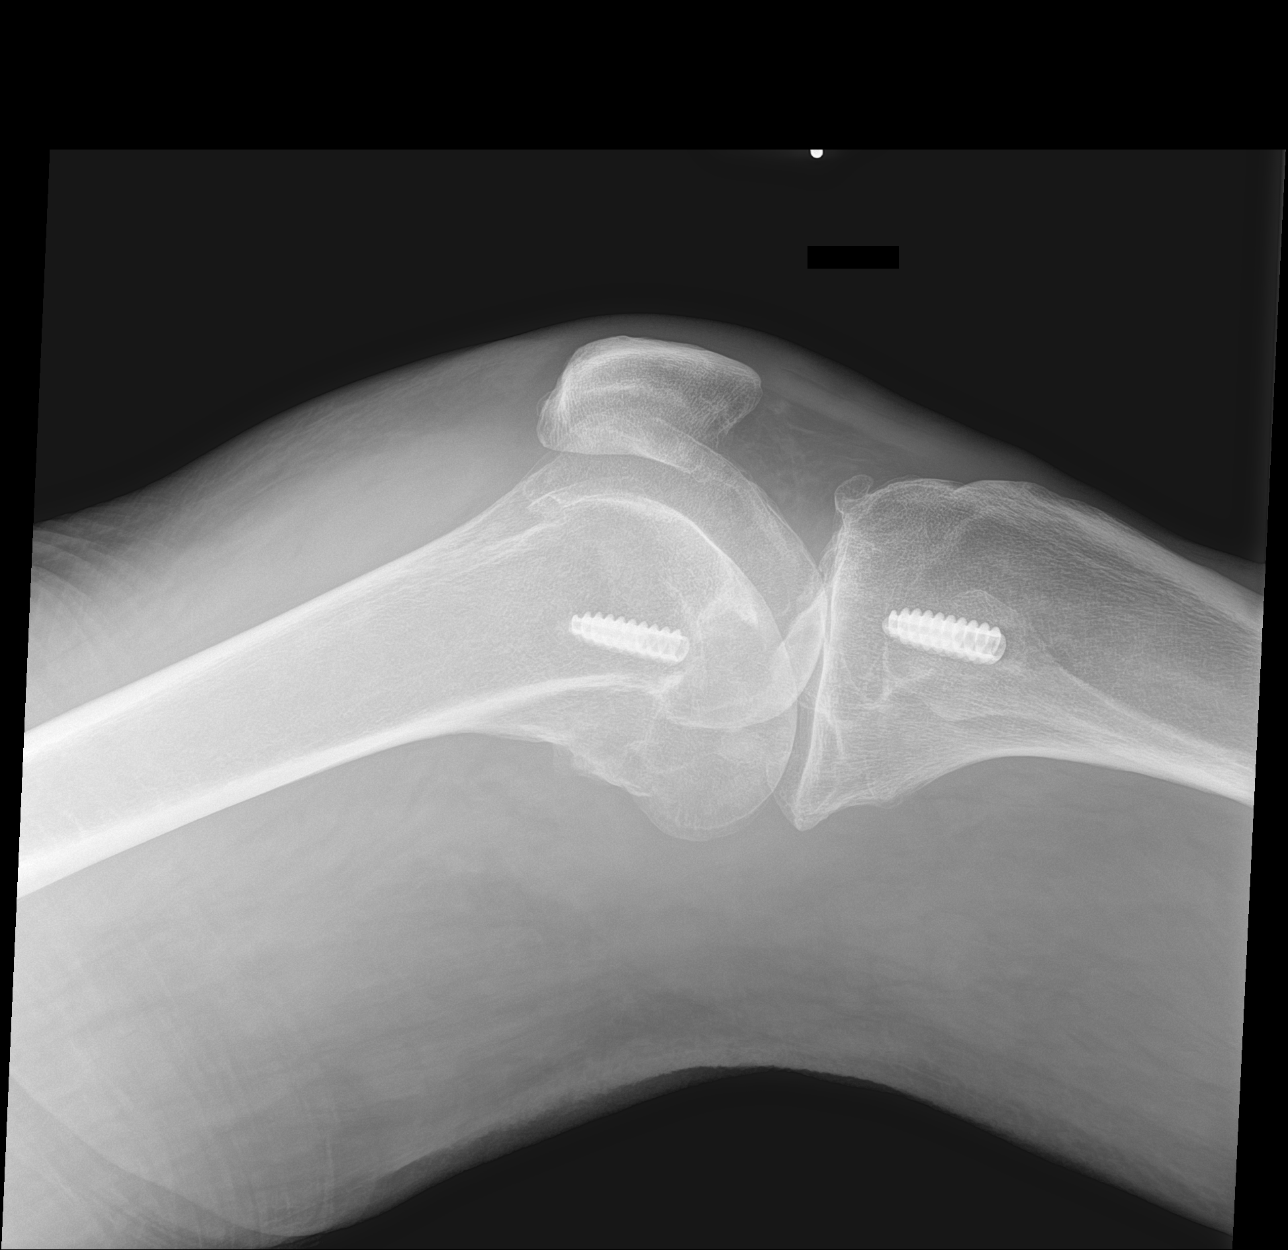

[4 of 4 positions shown; findings below may reference images not displayed]

FINDINGS: No fracture or dislocation of the left knee. Moderate
tricompartmental joint space narrowing and osteophytosis. Evidence
of prior tibial tunnel ACL graft reconstruction. No knee joint
effusion. Soft tissues are unremarkable.
IMPRESSION: 1.  No fracture or dislocation of the left knee.

2.  Moderate tricompartmental arthrosis.

3.  Evidence of prior tibial tunnel ACL graft reconstruction.

## 2021-07-02 MED ORDER — KETOROLAC TROMETHAMINE 15 MG/ML IJ SOLN
15.0000 mg | Freq: Once | INTRAMUSCULAR | Status: AC
  Administered 2021-07-02: 15 mg via INTRAMUSCULAR
  Filled 2021-07-02: qty 1

## 2021-07-02 NOTE — ED Provider Notes (Signed)
?[facility] ?Provider Note ? ? ?CSN: [id number] ?Arrival date & time: 07/02/21  1146 ? ?  ? ?History ?Chief Complaint  ?Patient presents with  ? Knee Pain  ? ? ?Terry Guerrero is a 61 y.o. male with history of rheumatoid arthritis who presents the emergency department with left knee pain has been ongoing for 3 weeks.  Patient states that he was coming out of a crawl space tripped and landed onto his left knee.  Is been swollen and painful for the last 3 weeks.  Has been self-medicating with Norco which he takes for his back with little improvement.  Patient is not on any anticoagulation.  He denies any other injury. ? ? ?Knee Pain ? ?  ? ?Home Medications ?Prior to Admission medications   ?Medication Sig Start Date End Date Taking? Authorizing Provider  ?folic acid (FOLVITE) 1 MG tablet Take 1 tablet (1 mg total) by mouth daily. 05/10/21   Ladene Artist, MD  ?HYDROcodone-acetaminophen (NORCO) 7.5-325 MG tablet Take 1 tablet by mouth every 6 (six) hours as needed for moderate pain. 06/15/21   Susy Frizzle, MD  ?levETIRAcetam (KEPPRA) 500 MG tablet Take 1 tablet (500 mg total) by mouth 2 (two) times daily. 05/08/21   Cameron Sprang, MD  ?sulfaSALAzine (AZULFIDINE) 500 MG tablet TAKE 2 TABLETS BY MOUTH TWICE DAILY 05/10/21   Ladene Artist, MD  ?   ? ?Allergies    ?Gold-containing drug products and Naprosyn [naproxen]   ? ?Review of Systems   ?Review of Systems  ?All other systems reviewed and are negative. ? ?Physical Exam ?Updated Vital Signs ?BP 114/78   Pulse 88   Temp 98 ?F (36.7 ?C)   Resp 16   Ht 5' 6"  (1.676 m)   Wt 63.5 kg   SpO2 96%   BMI 22.60 kg/m?  ?Physical Exam ?Vitals and nursing note reviewed.  ?Constitutional:   ?   Appearance: Normal appearance.  ?HENT:  ?   Head: Normocephalic and atraumatic.  ?Eyes:  ?   General:     ?   Right eye: No discharge.     ?   Left eye: No discharge.  ?   Conjunctiva/sclera: Conjunctivae normal.  ?Pulmonary:  ?   Effort: Pulmonary effort  is normal.  ?Musculoskeletal:  ?   Comments: Moderate size joint effusion without any significant erythema or warmth.  Patella is intact and nontender. Positive ballottement test.   ?Skin: ?   General: Skin is warm and dry.  ?   Findings: No rash.  ?Neurological:  ?   General: No focal deficit present.  ?   Mental Status: He is alert.  ?Psychiatric:     ?   Mood and Affect: Mood normal.     ?   Behavior: Behavior normal.  ? ? ?ED Results / Procedures / Treatments   ?Labs ?(all labs ordered are listed, but only abnormal results are displayed) ?Labs Reviewed - No data to display ? ?EKG ?None ? ?Radiology ?DG Knee Complete 4 Views Left ? ?Result Date: 07/02/2021 ?CLINICAL DATA:  Fall, pain for 3 weeks EXAM: LEFT KNEE - COMPLETE 4+ VIEW COMPARISON:  None. FINDINGS: No fracture or dislocation of the left knee. Moderate tricompartmental joint space narrowing and osteophytosis. Evidence of prior tibial tunnel ACL graft reconstruction. No knee joint effusion. Soft tissues are unremarkable. IMPRESSION: 1.  No fracture or dislocation of the left knee. 2.  Moderate tricompartmental arthrosis. 3.  Evidence of prior  tibial tunnel ACL graft reconstruction. Electronically Signed   By: Delanna Ahmadi M.D.   On: 07/02/2021 12:26   ? ?Procedures ?Procedures  ? ? ?Medications Ordered in ED ?Medications  ?ketorolac (TORADOL) 15 MG/ML injection 15 mg (has no administration in time range)  ? ? ?ED Course/ Medical Decision Making/ A&P ?  ?                        ?Medical Decision Making ?Amount and/or Complexity of Data Reviewed ?Radiology: ordered. ? ?Risk ?Prescription drug management. ? ? ?Terry Guerrero is a 60 y.o. male who presents to the emergency department with a 3-week history of left knee pain.  I would low suspicion for septic joint at this time despite significant swelling over the left knee.  It is not warm or tender to palpation.  Patient's vital signs are normal and he does not have fevers nor is he had fevers over the last  3 weeks.  I have a low suspicion for fracture or dislocation.  X-ray confirms no fracture or dislocation which I personally ordered and interpreted.  I do agree with the radiologist interpretation..  I will give the patient a shot of Toradol in the department.  I discussed his allergy with naproxen.  Patient and significant other at bedside states that he has had Toradol before.  I will also give him a knee immobilizer, crutches, and follow-up with orthopedics.  Strict return precautions were discussed with the patient at the bedside.  He is safe for discharge. ? ?Final Clinical Impression(s) / ED Diagnoses ?Final diagnoses:  ?Acute pain of left knee  ?Effusion of left knee  ? ? ?Rx / DC Orders ?ED Discharge Orders   ? ? None  ? ?  ? ? ?  ?Hendricks Limes, PA-C ?07/02/21 1259 ? ?  ?Isla Pence, MD ?07/02/21 1537 ? ?

## 2021-07-02 NOTE — Discharge Instructions (Addendum)
Please follow-up with orthopedics.  I would continue taking 600 mg ibuprofen every 6 hours.  Can also use local heat over the area.  Please remain in the knee immobilizer until you follow-up with orthopedics.  Please return to the emergency department for any worsening symptoms. ?

## 2021-07-02 NOTE — ED Triage Notes (Signed)
Pt presents today with left knee pain x 3 weeks, was coming out from under a crawl space hit his head and fell landing on his knee. States pain not improving, has not taken anything for pain today. Previous ACL surgery to same knee several years ago. ?

## 2021-07-04 ENCOUNTER — Ambulatory Visit: Payer: Medicare HMO | Admitting: Internal Medicine

## 2021-07-04 ENCOUNTER — Encounter: Payer: Self-pay | Admitting: Internal Medicine

## 2021-07-04 VITALS — BP 108/68 | HR 80 | Ht 66.0 in | Wt 142.0 lb

## 2021-07-04 DIAGNOSIS — R55 Syncope and collapse: Secondary | ICD-10-CM | POA: Diagnosis not present

## 2021-07-04 NOTE — Patient Instructions (Signed)
Medication Instructions:  °Your physician recommends that you continue on your current medications as directed. Please refer to the Current Medication list given to you today. ° °*If you need a refill on your cardiac medications before your next appointment, please call your pharmacy* ° ° °Lab Work: °NONE  ° °If you have labs (blood work) drawn today and your tests are completely normal, you will receive your results only by: °MyChart Message (if you have MyChart) OR °A paper copy in the mail °If you have any lab test that is abnormal or we need to change your treatment, we will call you to review the results. ° ° °Testing/Procedures: °NONE  ° ° °Follow-Up: °At [facility], you and your health needs are our priority.  As part of our continuing mission to provide you with exceptional heart care, we have created designated Provider Care Teams.  These Care Teams include your primary Cardiologist (physician) and Advanced Practice Providers (APPs -  Physician Assistants and Nurse Practitioners) who all work together to provide you with the care you need, when you need it. ° °We recommend signing up for the patient portal called "MyChart".  Sign up information is provided on this After Visit Summary.  MyChart is used to connect with patients for Virtual Visits (Telemedicine).  Patients are able to view lab/test results, encounter notes, upcoming appointments, etc.  Non-urgent messages can be sent to your provider as well.   °To learn more about what you can do with MyChart, go to [email or web address].   ° °Your next appointment:   ° As Needed  ° °The format for your next appointment:   °In Person ° °Provider:   °Gregg Taylor, MD  ° ° °Other Instructions °Thank you for choosing [facility]! °  ° ° °

## 2021-07-04 NOTE — Progress Notes (Signed)
? ? ? ? ?HPI ?Terry Guerrero returns today for followup. He is a pleasant middle aged man with syncope who wore a cardiac monitor which demonstrated daytime pauses of up to 5 seconds for which he denies symptoms. He has not been inclined to undergo PPM insertion. He also has a h/o tobacco abuse. He denies chest pain or sob. He is still smoking. Since I saw him last, he notes that he has hit his head and fell and hurt his knee. His significant other is with him today and describes his episodes of altered mentation. They sound like seizures.  ?Allergies  ?Allergen Reactions  ? Gold-Containing Drug Products   ? Naprosyn [Naproxen]   ? ? ? ?Current Outpatient Medications  ?Medication Sig Dispense Refill  ? folic acid (FOLVITE) 1 MG tablet Take 1 tablet (1 mg total) by mouth daily. 30 tablet 11  ? HYDROcodone-acetaminophen (NORCO) 7.5-325 MG tablet Take 1 tablet by mouth every 6 (six) hours as needed for moderate pain. 30 tablet 0  ? levETIRAcetam (KEPPRA) 500 MG tablet Take 1 tablet (500 mg total) by mouth 2 (two) times daily. 180 tablet 3  ? sulfaSALAzine (AZULFIDINE) 500 MG tablet TAKE 2 TABLETS BY MOUTH TWICE DAILY 120 tablet 11  ? ?No current facility-administered medications for this visit.  ? ? ? ?Past Medical History:  ?Diagnosis Date  ? Arthritis   ? GERD (gastroesophageal reflux disease)   ? Rheumatoid arthritis(714.0)   ? Seizures ([place]) 12/2020  ? Ulcerative colitis ([place])   ? ? ?ROS: ? ? All systems reviewed and negative except as noted in the HPI. ? ? ?Past Surgical History:  ?Procedure Laterality Date  ? ANTERIOR CRUCIATE LIGAMENT REPAIR    ? Left knee  ? COLONOSCOPY    ? HERNIA REPAIR  2020  ? ? ? ?Family History  ?Problem Relation Age of Onset  ? Alcoholism Mother   ? Colon cancer Neg Hx   ? Esophageal cancer Neg Hx   ? Stomach cancer Neg Hx   ? Rectal cancer Neg Hx   ? ? ? ?Social History  ? ?Socioeconomic History  ? Marital status: Divorced  ?  Spouse name: Not on file  ? Number of children: 1  ? Years of  education: Not on file  ? Highest education level: Not on file  ?Occupational History  ? Occupation: Disabled   ?Tobacco Use  ? Smoking status: Every Day  ?  Packs/day: 1.00  ?  Years: 25.00  ?  Pack years: 25.00  ?  Types: Cigarettes  ? Smokeless tobacco: Never  ?Vaping Use  ? Vaping Use: Never used  ?Substance and Sexual Activity  ? Alcohol use: Not Currently  ?  Comment: occasional  ? Drug use: Yes  ?  Frequency: 12.0 times per week  ?  Types: Hydrocodone, Marijuana  ?  Comment: daily  ? Sexual activity: Yes  ?  Birth control/protection: None  ?Other Topics Concern  ? Not on file  ?Social History Narrative  ? Daily caffeine   ? Right handed   ? Lives with girlfriend.   ? 1 son.   ? ?Social Determinants of Health  ? ?Financial Resource Strain: Low Risk   ? Difficulty of Paying Living Expenses: Not very hard  ?Food Insecurity: No Food Insecurity  ? Worried About Charity fundraiser in the Last Year: Never true  ? Ran Out of Food in the Last Year: Never true  ?Transportation Needs: No Transportation Needs  ?  Lack of Transportation (Medical): No  ? Lack of Transportation (Non-Medical): No  ?Physical Activity: Sufficiently Active  ? Days of Exercise per Week: 5 days  ? Minutes of Exercise per Session: 30 min  ?Stress: No Stress Concern Present  ? Feeling of Stress : Not at all  ?Social Connections: Moderately Isolated  ? Frequency of Communication with Friends and Family: More than three times a week  ? Frequency of Social Gatherings with Friends and Family: More than three times a week  ? Attends Religious Services: Never  ? Active Member of Clubs or Organizations: No  ? Attends Archivist Meetings: Never  ? Marital Status: Living with partner  ?Intimate Partner Violence: Not At Risk  ? Fear of Current or Ex-Partner: No  ? Emotionally Abused: No  ? Physically Abused: No  ? Sexually Abused: No  ? ? ? ?Ht 5' 6"  (1.676 m)   Wt 142 lb (64.4 kg)   BMI 22.92 kg/m?  ? ?Physical Exam: ? ?Well appearing  NAD ?HEENT: Unremarkable ?Neck:  No JVD, no thyromegally ?Lymphatics:  No adenopathy ?Back:  No CVA tenderness ?Lungs:  Clear with no wheezes ?HEART:  Regular rate rhythm, no murmurs, no rubs, no clicks ?Abd:  soft, positive bowel sounds, no organomegally, no rebound, no guarding ?Ext:  2 plus pulses, no edema, no cyanosis, no clubbing ?Skin:  No rashes no nodules ?Neuro:  CN II through XII intact, motor grossly intact ? ?Assess/Plan:  ? ?Syncope - despite his documented bradycardia, his symptoms are due to seizures which his significant other very clearly describe. I have recommended watchful waiting.  ?HTN - his bp is controlled. ?Tobacco abuse - he is encouraged to stop smoking ?  ?Carleene Overlie Jax Kentner,MD ?

## 2021-07-12 ENCOUNTER — Encounter: Payer: Self-pay | Admitting: Orthopedic Surgery

## 2021-07-12 ENCOUNTER — Other Ambulatory Visit: Payer: Self-pay | Admitting: Family Medicine

## 2021-07-12 ENCOUNTER — Ambulatory Visit: Payer: Medicare HMO | Admitting: Orthopedic Surgery

## 2021-07-12 ENCOUNTER — Other Ambulatory Visit: Payer: Self-pay

## 2021-07-12 VITALS — BP 107/76 | HR 86 | Ht 66.0 in | Wt 137.0 lb

## 2021-07-12 DIAGNOSIS — M25462 Effusion, left knee: Secondary | ICD-10-CM

## 2021-07-12 MED ORDER — PREDNISONE 10 MG (21) PO TBPK: ORAL_TABLET | ORAL | 0 refills | Status: DC

## 2021-07-12 NOTE — Progress Notes (Signed)
New Patient Visit ? ?Assessment: ?DEIDRICK RAINEY is a 61 y.o. male with the following: ?1. Effusion, left knee ? ?Plan: ?Mr. Renwick has left knee pain, and a knee effusion after fall directly onto the left knee.  He has remote history of left ACL reconstruction.  On physical exam today, he does have an effusion, without any appreciable laxity.  Negative Lachman.  However, he has limited range of motion and some guarding due to the effusion.  I have recommended that we aspirate the knee joint, and encourage range of motion.  He is in agreement with this plan.  I will also provide him with a short course of prednisone to improve the swelling and pain in his left knee.  He can continue with his activities as tolerated.  Once again, I reiterated the importance of focusing on his range of motion.  I will see him back in 2 weeks for repeat evaluation.  If he continues to have issues at that time, we may consider obtaining an MRI. ? ?Procedure note - aspiration Left knee joint ?  ?Verbal consent was obtained to aspirate the left knee joint  ?Timeout was completed to confirm the site of injection.  The skin was prepped with alcohol and ethyl chloride was sprayed at the injection site.  ?An 18-gauge needle was used to aspirate approximately 45 cc of joint fluid from the left knee using a superolateral approach.  ?There were no complications. A sterile bandage and an ACE wrap was applied. ?  ? ? ?Follow-up: ?Return in about 2 weeks (around 07/26/2021). ? ?Subjective: ? ?Chief Complaint  ?Patient presents with  ? Knee Pain  ?  Lt knee after injury 1 mo ago. States he hit his head hard and went down landing on the left knee in gravel.   ? ? ?History of Present Illness: ?OSIEL STICK is a 61 y.o. male who presents for evaluation of left knee pain.  He does have a history of left ACL reconstruction, completed 20-25 years ago.  He recovered well following this.  Approximate 1 month ago, he was working on a house, in the crawl  space.  He banged his head, blacked out and landed directly onto his left knee.  Since then, he has had pain, swelling and stiffness of the left knee.  He has been wearing a brace on his left knee to limited range of motion.  He has been taking medications as needed.  He does not feel as though his knee is buckling ? ? ?Review of Systems: ?No fevers or chills ?No numbness or tingling ?No chest pain ?No shortness of breath ?No bowel or bladder dysfunction ?No GI distress ?No headaches ? ? ?Medical History: ? ?Past Medical History:  ?Diagnosis Date  ? Arthritis   ? GERD (gastroesophageal reflux disease)   ? Rheumatoid arthritis(714.0)   ? Seizures ([place]) 12/2020  ? Ulcerative colitis ([place])   ? ? ?Past Surgical History:  ?Procedure Laterality Date  ? ANTERIOR CRUCIATE LIGAMENT REPAIR    ? Left knee  ? COLONOSCOPY    ? HERNIA REPAIR  2020  ? ? ?Family History  ?Problem Relation Age of Onset  ? Alcoholism Mother   ? Colon cancer Neg Hx   ? Esophageal cancer Neg Hx   ? Stomach cancer Neg Hx   ? Rectal cancer Neg Hx   ? ?Social History  ? ?Tobacco Use  ? Smoking status: Every Day  ?  Packs/day: 1.00  ?  Years:  25.00  ?  Pack years: 25.00  ?  Types: Cigarettes  ? Smokeless tobacco: Never  ?Vaping Use  ? Vaping Use: Never used  ?Substance Use Topics  ? Alcohol use: Not Currently  ?  Comment: occasional  ? Drug use: Yes  ?  Frequency: 12.0 times per week  ?  Types: Hydrocodone, Marijuana  ?  Comment: daily  ? ? ?Allergies  ?Allergen Reactions  ? Gold-Containing Drug Products   ? Naprosyn [Naproxen]   ? ? ?No outpatient medications have been marked as taking for the 07/12/21 encounter (Office Visit) with Mordecai Rasmussen, MD.  ? ? ?Objective: ?BP 107/76   Pulse 86   Ht 5' 6"  (1.676 m)   Wt 137 lb (62.1 kg)   BMI 22.11 kg/m?  ? ?Physical Exam: ? ?General: Alert and oriented. and No acute distress. ?Gait: Left sided antalgic gait. ? ?Evaluation of the left knee demonstrates a significant effusion.  His patella is ballotable.  He  has some atrophy of the left quadriceps.  He has pain trying to achieve full extension.  He tolerates flexion to 90 degrees, but notes stiffness within the knee.  Negative Lachman.  No increased laxity varus valgus stress.  Some tenderness to palpation over the medial joint line. ? ?IMAGING: ?I personally reviewed images previously obtained from the ED ? ?X-ray of the left knee demonstrates hardware associated with prior ACL reconstruction.  He has some degenerative changes within the lateral compartment.  There is a knee effusion. ? ?New Medications:  ?No orders of the defined types were placed in this encounter. ? ? ? ? ?Mordecai Rasmussen, MD ? ?07/12/2021 ?10:07 AM ? ? ?

## 2021-07-12 NOTE — Telephone Encounter (Signed)
LOV 01/23/21 ?Last refill 06/15/21, #30, 0 refills ? ?Please review, thanks! ? ?

## 2021-07-12 NOTE — Patient Instructions (Signed)
Your knee was aspirated in clinic today.  We applied an Ace wrap for some compression.  I urged you to continue working on range of motion of the left knee as tolerated.  You can take Tylenol, ibuprofen (or similar) as well as ice the knee.  We will plan to see you back in approximately 2-3 weeks for repeat evaluation. ?

## 2021-07-13 MED ORDER — HYDROCODONE-ACETAMINOPHEN 7.5-325 MG PO TABS: 1.0000 | ORAL_TABLET | Freq: Four times a day (QID) | ORAL | 0 refills | Status: DC | PRN

## 2021-07-26 ENCOUNTER — Encounter: Payer: Self-pay | Admitting: Orthopedic Surgery

## 2021-07-26 ENCOUNTER — Ambulatory Visit: Payer: Medicare HMO | Admitting: Orthopedic Surgery

## 2021-07-26 VITALS — Ht 66.0 in | Wt 137.0 lb

## 2021-07-26 DIAGNOSIS — M25462 Effusion, left knee: Secondary | ICD-10-CM | POA: Diagnosis not present

## 2021-07-26 NOTE — Progress Notes (Signed)
Orthopaedic Clinic Return ? ?Assessment: ?Terry Guerrero is a 61 y.o. male with the following: ?Acute left knee effusion ? ?Plan: ?[name] reports that his left knee is much better, following aspiration.  He has no pain.  His range of motion is drastically improved.  He has no symptoms of instability.  Nothing further is needed at this time.  Follow-up as needed. ? ? ?Follow-up: ?No follow-ups on file. ? ? ?Subjective: ? ?Chief Complaint  ?Patient presents with  ? Knee Pain  ?  Lt knee feeling much better.  ? ? ?History of Present Illness: ?Terry Guerrero is a 61 y.o. male who returns to clinic for repeat evaluation of left knee pain and swelling.  He was seen in clinic, approximately 2 weeks ago for acute onset knee pain and swelling.  He had limited range of motion.  We aspirated his left knee, and he reports that this provided significant improvement in his symptoms.  He also took prednisone, which helped with his symptoms as well.  At this point, he is not having any pain.  No instability. ?He does notice some clicking sensations within the knee, but this is not painful. ? ? ?Review of Systems: ?No fevers or chills ?No numbness or tingling ?No chest pain ?No shortness of breath ?No bowel or bladder dysfunction ?No GI distress ?No headaches ? ? ?Objective: ?Ht 5' 6"  (1.676 m)   Wt 137 lb (62.1 kg)   BMI 22.11 kg/m?  ? ?Physical Exam: ? ?Left knee without effusion.  Mild varus alignment overall.  He has full and painless range of motion.  Negative Lachman.  Walks with a nonantalgic gait. ? ?IMAGING: ?I personally ordered and reviewed the following images: ? ?No new imaging obtained today. ? ?Mordecai Rasmussen, MD ?07/26/2021 ?11:54 AM ? ? ?

## 2021-08-01 ENCOUNTER — Encounter: Payer: Self-pay | Admitting: Orthopaedic Surgery

## 2021-08-01 ENCOUNTER — Ambulatory Visit: Payer: Medicare HMO | Admitting: Orthopaedic Surgery

## 2021-08-01 VITALS — BP 113/80 | HR 80

## 2021-08-01 DIAGNOSIS — G8929 Other chronic pain: Secondary | ICD-10-CM | POA: Diagnosis not present

## 2021-08-01 DIAGNOSIS — M25462 Effusion, left knee: Secondary | ICD-10-CM | POA: Diagnosis not present

## 2021-08-01 DIAGNOSIS — M25562 Pain in left knee: Secondary | ICD-10-CM | POA: Diagnosis not present

## 2021-08-01 NOTE — Progress Notes (Signed)
My knee has swollen up again ? ?He has recurrent large effusion of the left knee.  He is a patient of Dr. Amedeo Kinsman.  I have reviewed the notes.  He had aspiration of the knee on the left several weeks ago and did well until a few days ago when the knee had recurrence of effusion.  He denies any new trauma.  He is post old ACL repair about 30 years ago.  He has no redness, no fever.  He is on Eliquis. ? ?Left knee has large effusion, pain, decreased ROM of 5 to 65, pain diffusely.  NV intact. ? ?Encounter Diagnoses  ?Name Primary?  ? Effusion, left knee Yes  ? Chronic pain of left knee   ? ?PROCEDURE NOTE: ? ?The patient request injection, verbal consent was obtained. ? ?The left knee was prepped appropriately after time out was performed.  ? ?Sterile technique was observed and anesthesia was provided by ethyl chloride and a 20-gauge needle was used to inject the knee area.  A 16-gauge needle was then used to aspirate the knee. ? ?Color of fluid aspirated was straw ? ?Total cc's aspirated was 55.   ? ?Injection of 1 cc of DepoMedrol 81m with several cc's of plain xylocaine was then performed. ? ?A band aid dressing was applied. ? ?The patient was advised to apply ice later today and tomorrow to the injection sight as needed.  ? ?I will get MRI of the left knee as he has recurrent effusions and inability to get about without crutches and brace. ? ?He will see Dr. CAmedeo Kinsmanto review MRI.  I can see earlier if needed. ? ?Call if any problem. ? ?Precautions discussed. ? ?Electronically Signed ?WSanjuana Kava MD ?4/11/20238:26 AM ? ?

## 2021-08-18 ENCOUNTER — Other Ambulatory Visit: Payer: Self-pay

## 2021-08-18 ENCOUNTER — Other Ambulatory Visit: Payer: Self-pay | Admitting: Family Medicine

## 2021-08-18 ENCOUNTER — Ambulatory Visit (HOSPITAL_COMMUNITY)
Admission: RE | Admit: 2021-08-18 | Discharge: 2021-08-18 | Disposition: A | Discharge status: Home / Self Care | Payer: Medicare HMO | Source: Ambulatory Visit | Attending: Orthopaedic Surgery | Admitting: Orthopaedic Surgery

## 2021-08-18 DIAGNOSIS — S83232A Complex tear of medial meniscus, current injury, left knee, initial encounter: Secondary | ICD-10-CM | POA: Diagnosis not present

## 2021-08-18 DIAGNOSIS — G8929 Other chronic pain: Secondary | ICD-10-CM | POA: Insufficient documentation

## 2021-08-18 DIAGNOSIS — M25562 Pain in left knee: Secondary | ICD-10-CM | POA: Insufficient documentation

## 2021-08-18 DIAGNOSIS — M1712 Unilateral primary osteoarthritis, left knee: Secondary | ICD-10-CM | POA: Diagnosis not present

## 2021-08-18 DIAGNOSIS — M7122 Synovial cyst of popliteal space [Baker], left knee: Secondary | ICD-10-CM | POA: Diagnosis not present

## 2021-08-18 DIAGNOSIS — M25462 Effusion, left knee: Secondary | ICD-10-CM | POA: Diagnosis not present

## 2021-08-18 DIAGNOSIS — S83512A Sprain of anterior cruciate ligament of left knee, initial encounter: Secondary | ICD-10-CM | POA: Diagnosis not present

## 2021-08-18 IMAGING — MR MR KNEE*L* W/O CM
7 series · 40 of 40 positions shown · non-contrast
Comparison: Radiographs dated [DATE]

CLINICAL DATA: Knee pain, chronic degenerative disease. Prior ACL
repair.

EXAM:
MRI OF THE LEFT KNEE WITHOUT CONTRAST
TECHNIQUE: Multiplanar, multisequence MR imaging of the left was performed. No
intravenous contrast was administered.

[Series 8: T2 fat-sat · axial · left · 4.0mm · 0.47mm/px · z∈[-83,+40]mm · 6 of 26 slices shown (1 of 3)]
[im 1/26]
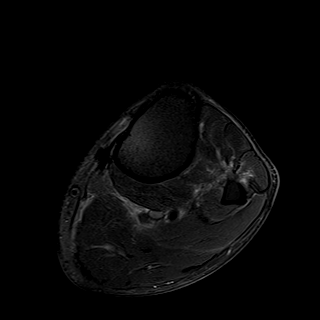
[im 6/26]
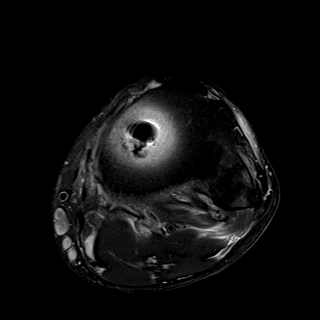
[im 11/26]
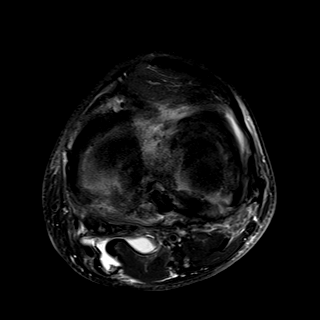
[im 16/26]
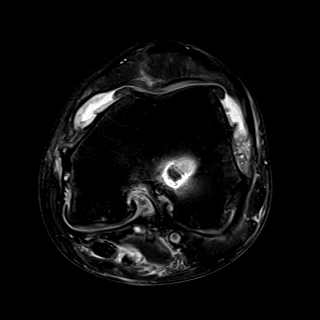
[im 21/26]
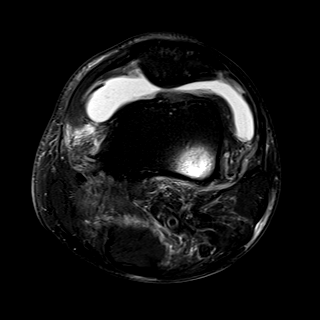
[im 26/26]
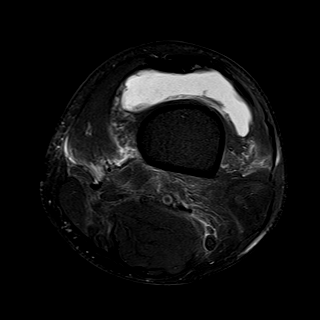

[Series 9: T1 · coronal · left · 4.0mm · 0.59mm/px · 5 of 24 slices shown]
[im 1/24]
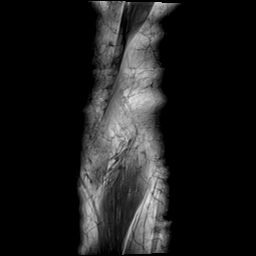
[im 6/24]
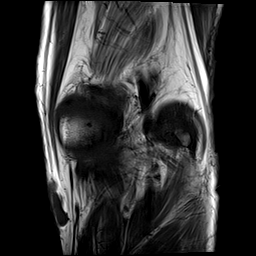
[im 12/24]
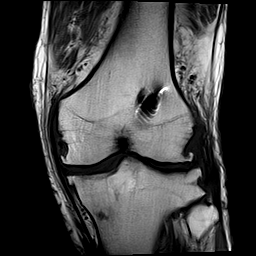
[im 18/24]
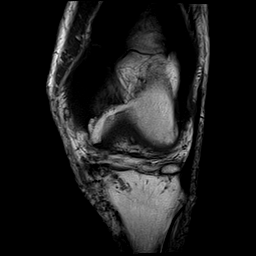
[im 24/24]
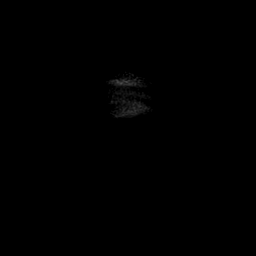

[Series 10: T2 fat-sat · coronal · left · 4.0mm · 0.59mm/px · 5 of 24 slices shown (2 of 3)]
[im 1/24]
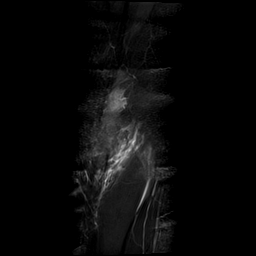
[im 6/24]
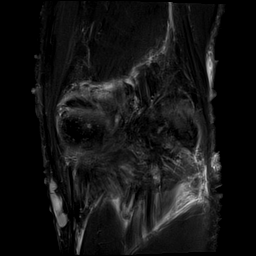
[im 12/24]
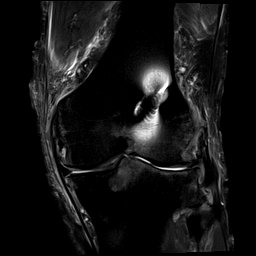
[im 18/24]
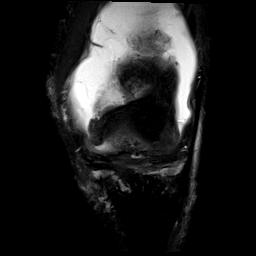
[im 24/24]
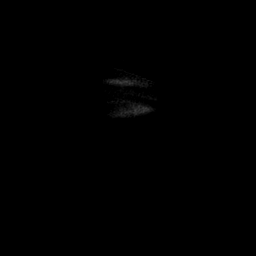

[Series 11: PD fat-sat · coronal · left · 3.0mm · 0.59mm/px · 8 of 40 slices shown (1 of 2)]
[im 1/40]
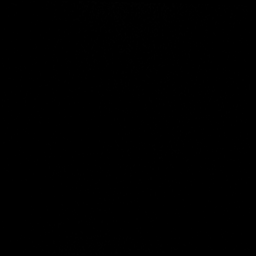
[im 6/40]
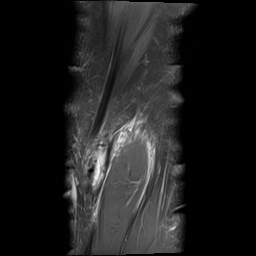
[im 12/40]
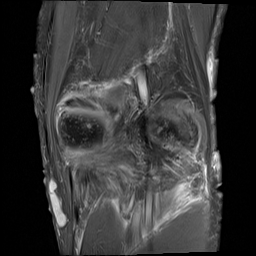
[im 17/40]
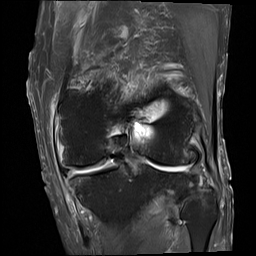
[im 23/40]
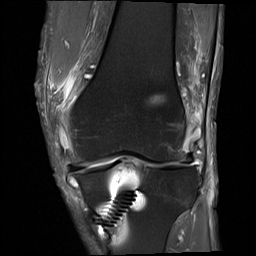
[im 28/40]
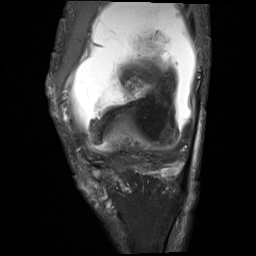
[im 34/40]
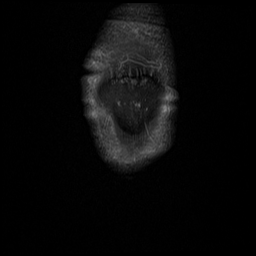
[im 40/40]
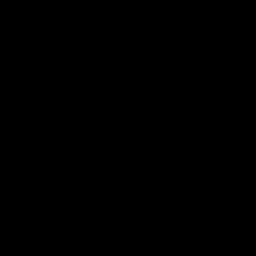

[Series 12: PD fat-sat · sagittal · left · 3.0mm · 0.59mm/px · 6 of 28 slices shown (2 of 2)]
[im 1/28]
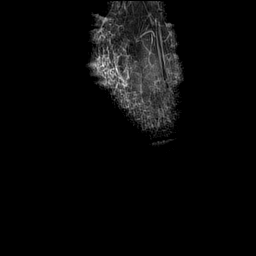
[im 6/28]
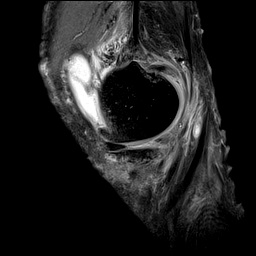
[im 11/28]
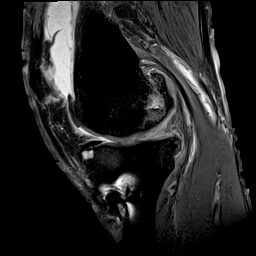
[im 17/28]
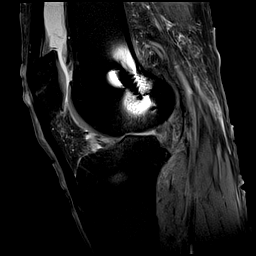
[im 22/28]
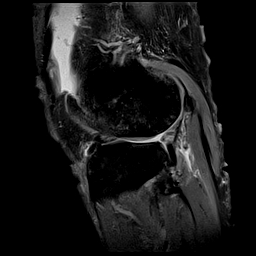
[im 28/28]
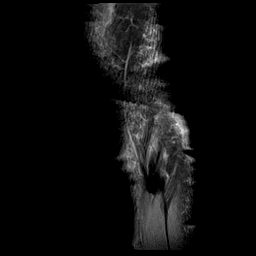

[Series 13: T2 fat-sat · sagittal · left · 3.0mm · 0.59mm/px · 6 of 28 slices shown (3 of 3)]
[im 1/28]
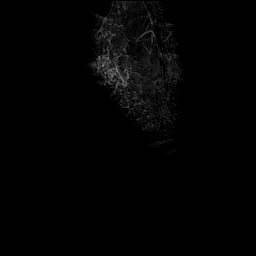
[im 6/28]
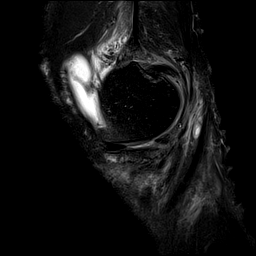
[im 11/28]
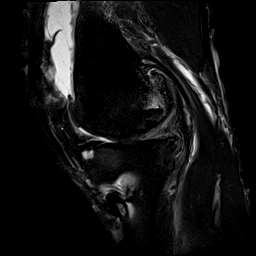
[im 17/28]
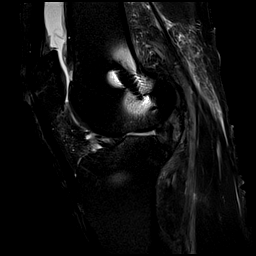
[im 22/28]
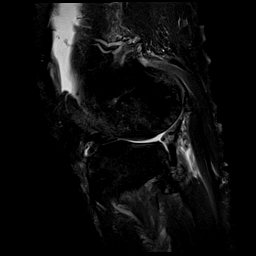
[im 28/28]
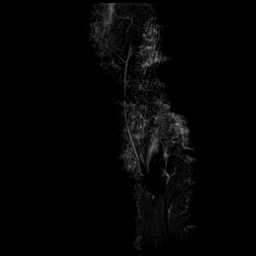

[Series 14: PD · coronal · left · 2.0mm · 0.47mm/px · 4 of 20 slices shown]
[im 1/20]
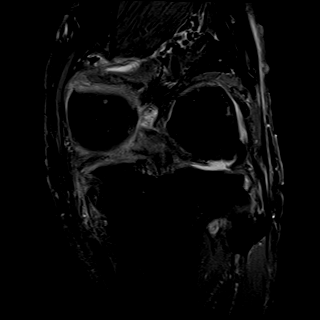
[im 7/20]
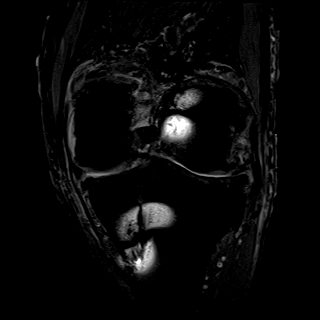
[im 13/20]
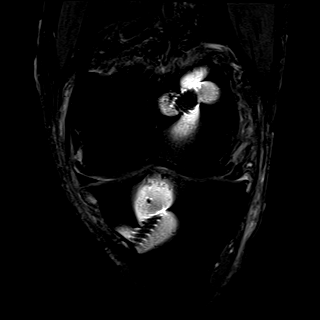
[im 20/20]
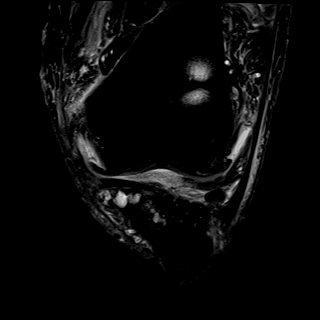

[40 of 40 positions shown; findings below may reference images not displayed]

FINDINGS: MENISCI

Medial: Advanced degenerative changes of the body/posterior horn of
the medial meniscus.

Lateral: Degeneration/diminution of the body/posterior horn of the
lateral meniscus.

LIGAMENTS

Cruciates: Postsurgical changes for prior ACL graft, without
visualization of the graft consistent with full-thickness re-tear.
PCL is intact.

Collaterals: Medial collateral ligament is intact. Lateral
collateral ligament complex is intact.

CARTILAGE

Patellofemoral:  No chondral defect.

Medial: Full-thickness cartilage defect of the medial tibial plateau
with subchondral edema.

Lateral: Full-thickness cartilage defect of the tibial plateau and
femoral condyle with subchondral edema.

JOINT: Large joint effusion. Mild edema of the Hoffa's fat pad.
Plical thickening concerning for synovitis.

POPLITEAL FOSSA: Popliteus tendon is intact. Large Baker's cyst
measuring approximately 2.5 x 1.0 x 4.6 cm.

EXTENSOR MECHANISM: Intact quadriceps tendon. Intact patellar
tendon. Intact lateral patellar retinaculum. Intact medial patellar
retinaculum. Intact MPFL.

BONES: No aggressive osseous lesion. No fracture or dislocation.
Tricompartmental marginal osteophytes.

Other: No fluid collection or hematoma. Muscles are normal.
IMPRESSION: 1.  Full-thickness ACL graft tear.

2. Moderate medial tibiofemoral and patellofemoral osteoarthritis
characterized by complex tears of the body/posterior horn of the
medial and lateral menisci, articular cartilage thinning and
subchondral edema with marginal osteophytes.

3.  Large suprapatellar joint effusion with synovitis.

4.  Baker's cyst measuring approximately 2.5 x 1.0 x 4.6 cm.

## 2021-08-18 MED ORDER — HYDROCODONE-ACETAMINOPHEN 7.5-325 MG PO TABS: 1.0000 | ORAL_TABLET | Freq: Four times a day (QID) | ORAL | 0 refills | Status: DC | PRN

## 2021-08-18 NOTE — Telephone Encounter (Signed)
LOV 01/23/21 ?Last refill 07/13/21, #30, 0 refills ? ?Please review, thanks! ? ?

## 2021-08-22 ENCOUNTER — Encounter: Payer: Self-pay | Admitting: Orthopedic Surgery

## 2021-08-22 ENCOUNTER — Ambulatory Visit: Payer: Medicare HMO | Admitting: Orthopedic Surgery

## 2021-08-22 VITALS — Ht 66.0 in | Wt 137.0 lb

## 2021-08-22 DIAGNOSIS — G8929 Other chronic pain: Secondary | ICD-10-CM

## 2021-08-22 DIAGNOSIS — M25562 Pain in left knee: Secondary | ICD-10-CM | POA: Diagnosis not present

## 2021-08-23 ENCOUNTER — Encounter: Payer: Self-pay | Admitting: Orthopedic Surgery

## 2021-08-23 NOTE — Progress Notes (Signed)
Orthopaedic Clinic Return ? ?Assessment: ?Terry Guerrero is a 61 y.o. male with the following: ?Left knee pain. ? ?Plan: ?Terry Guerrero had another effusion.  He was evaluated by Dr. Luna Guerrero.  MRI demonstrates rupture of his ACL graft, as well as advanced degenerative changes within the medial lateral compartments.  At this point, I recommended symptomatic Rittman, or consideration for a knee replacement.  He is not interested in surgery.  We discussed an injection, but he is not interested.  He should continue to remain as active as possible.  Medications as needed.  Brace and compression sleeve as needed. ? ? ?Follow-up: ?Return if symptoms worsen or fail to improve. ? ? ?Subjective: ? ?Chief Complaint  ?Patient presents with  ? Knee Pain  ?  Lt knee pain and swelling here for MRI results.   ? ? ?History of Present Illness: ?Terry Guerrero is a 61 y.o. male who returns to clinic for repeat evaluation of left knee pain and swelling.  I saw him in clinic, and drained an effusion.  He returned, and was doing well.  Shortly thereafter, the effusion returned.  He was evaluated by Dr. Luna Guerrero.  Once again, his effusion was aspirated.  However, due to the pain he was experiencing at that time, Dr. Luna Guerrero ordered an MRI.  He is here to discuss the results.  He continues to have pain in the left knee.  He is using a brace and an Ace wrap.  Medications as needed. ? ?Review of Systems: ?No fevers or chills ?No numbness or tingling ?No chest pain ?No shortness of breath ?No bowel or bladder dysfunction ?No GI distress ?No headaches ? ? ?Objective: ?Ht 5' 6"  (1.676 m)   Wt 137 lb (62.1 kg)   BMI 22.11 kg/m?  ? ?Physical Exam: ? ?Left knee without effusion.  Mild varus alignment overall.  Left-sided antalgic gait.  2+ Lachman.  No effusion today.  Tenderness palpation on the medial lateral joint line. ? ?IMAGING: ?I personally ordered and reviewed the following images: ? ?Left knee MRI ? ?IMPRESSION: ?1.  Full-thickness ACL  graft tear. ?  ?2. Moderate medial tibiofemoral and patellofemoral osteoarthritis ?characterized by complex tears of the body/posterior horn of the ?medial and lateral menisci, articular cartilage thinning and ?subchondral edema with marginal osteophytes. ?  ?3.  Large suprapatellar joint effusion with synovitis. ?  ?4.  Baker's cyst measuring approximately 2.5 x 1.0 x 4.6 cm. ? ? ?Mordecai Rasmussen, MD ?08/23/2021 ?9:25 AM ? ? ?

## 2021-08-25 ENCOUNTER — Other Ambulatory Visit: Payer: Self-pay | Admitting: Family Medicine

## 2021-08-25 NOTE — Telephone Encounter (Signed)
Refill refused.  ?Patient filled #30 08/18/21.  ?

## 2021-08-28 ENCOUNTER — Encounter: Payer: Self-pay | Admitting: Orthopedic Surgery

## 2021-08-29 ENCOUNTER — Encounter: Payer: Self-pay | Admitting: Orthopedic Surgery

## 2021-08-29 ENCOUNTER — Ambulatory Visit: Payer: Medicare HMO | Admitting: Orthopedic Surgery

## 2021-08-29 DIAGNOSIS — G8929 Other chronic pain: Secondary | ICD-10-CM | POA: Diagnosis not present

## 2021-08-29 DIAGNOSIS — M25562 Pain in left knee: Secondary | ICD-10-CM | POA: Diagnosis not present

## 2021-08-29 NOTE — Patient Instructions (Signed)

## 2021-08-29 NOTE — Progress Notes (Signed)
Orthopaedic Clinic Return ? ?Assessment: ?Terry Guerrero is a 61 y.o. male with the following: ?Left knee pain. ? ?Plan: ?Mr. Terry Guerrero continues to have pain in the left knee.  He would like to proceed with a left knee steroid injection.  This was completed in clinic today.  If he has further issues, I do think he would benefit from a knee replacement.  I will discuss this with Dr. Aline Guerrero.  Patient will follow-up as needed. ? ?Procedure note injection Left knee joint ?  ?Verbal consent was obtained to inject the left knee joint  ?Timeout was completed to confirm the site of injection.  The skin was prepped with alcohol and ethyl chloride was sprayed at the injection site.  ?A 21-gauge needle was used to inject 40 mg of Depo-Medrol and 1% lidocaine (3 cc) into the left knee using an anterolateral approach.  ?There were no complications. A sterile bandage was applied. ?  ?Follow-up: ?Return if symptoms worsen or fail to improve. ? ? ?Subjective: ? ?Chief Complaint  ?Patient presents with  ? Knee Pain  ?  LT knee ?Request injection  ? ? ?History of Present Illness: ?Terry Guerrero is a 61 y.o. male who returns to clinic for repeat evaluation of left knee pain and swelling.  I have seen him in clinic multiple times.  He continues to have pain in his left knee.  MRI demonstrated advanced degenerative changes, with failure of the ACL graft.  He is walking with crutches, and is using a brace.  He has limited improvement in his symptoms. ? ? ?Review of Systems: ?No fevers or chills ?No numbness or tingling ?No chest pain ?No shortness of breath ?No bowel or bladder dysfunction ?No GI distress ?No headaches ? ? ?Objective: ?There were no vitals taken for this visit. ? ?Physical Exam: ? ?Left knee without effusion.  Mild varus alignment overall.  Left-sided antalgic gait, ambulates with crutches.  2+ Lachman.  No effusion today.  Tenderness to palpation on the medial lateral joint line. ? ?IMAGING: ?I personally ordered and  reviewed the following images: ? ?Left knee MRI ? ?IMPRESSION: ?1.  Full-thickness ACL graft tear. ?  ?2. Moderate medial tibiofemoral and patellofemoral osteoarthritis ?characterized by complex tears of the body/posterior horn of the ?medial and lateral menisci, articular cartilage thinning and ?subchondral edema with marginal osteophytes. ?  ?3.  Large suprapatellar joint effusion with synovitis. ?  ?4.  Baker's cyst measuring approximately 2.5 x 1.0 x 4.6 cm. ? ? ?Terry Rasmussen, MD ?08/29/2021 ?10:00 AM ? ? ?

## 2021-09-04 ENCOUNTER — Encounter: Payer: Self-pay | Admitting: Gastroenterology

## 2021-09-05 ENCOUNTER — Other Ambulatory Visit: Payer: Self-pay

## 2021-09-05 DIAGNOSIS — G8929 Other chronic pain: Secondary | ICD-10-CM

## 2021-09-06 ENCOUNTER — Telehealth: Payer: Self-pay | Admitting: Orthopedic Surgery

## 2021-09-06 MED ORDER — TRAMADOL HCL 50 MG PO TABS: 50.0000 mg | ORAL_TABLET | Freq: Four times a day (QID) | ORAL | 0 refills | Status: DC | PRN

## 2021-09-06 NOTE — Telephone Encounter (Addendum)
Patient and designated party contact on file, Terry Guerrero, Terry Guerrero called to relay that he is in so much pain in his knee that he is vomiting; said he really wants and needs the knee replacement surgery. States he has been referred to another specialist for his RA; - which is being scheduled in November - said he does not understand why he's being referred there anyway. Terry Guerrero states he needs to be seen as soon as possible.  Please call patient to advise.* ?*note* patient's fiancee Terry Guerrero had mentioned that she had also left a voice message prior to speaking with [place] "that was not a very nice message."   We received the message after speaking with her and with patient, in which Terry Guerrero stated that they are expecting a call back today, as "there is a problem here."  Terry Guerrero "someone is going to see him ,as it is pure negligence that he would have to wait until November." Said to "please call back, or they will be calling  their lawyer and the Board of medicine." ?

## 2021-09-07 MED ORDER — HYDROCODONE-ACETAMINOPHEN 5-325 MG PO TABS: 1.0000 | ORAL_TABLET | Freq: Four times a day (QID) | ORAL | 0 refills | Status: AC | PRN

## 2021-09-07 NOTE — Addendum Note (Signed)
Addended by: Larena Glassman A on: 09/07/2021 03:29 PM   Modules accepted: Orders

## 2021-09-14 NOTE — Progress Notes (Signed)
Office Visit Note  Patient: Terry Guerrero             Date of Birth: 1961-02-02           MRN: [id number]             PCP: Susy Frizzle, MD Referring: Mordecai Rasmussen, MD Visit Date: 09/15/2021 Occupation: @GUAROCC @  Subjective:  Pain and swelling in left knee   History of Present Illness: Terry Guerrero is a 61 y.o. male seen in consultation per request of Dr. Amedeo Kinsman.  According the patient about 25 years ago he started having swelling in his hands.  Gradually the pain moved to his feet, knee joints and lower back.  He recalls seeing Dr. Alanda Amass at [facility] who diagnosed him with inflammatory arthritis and placed him on prednisone.  He states he will 60 mg of prednisone for almost 2 years and then he tapered off prednisone by himself as he did not like the side effects.  He was also given some braces.  He worked for [employer] had extra stress on his joints.  He states that his symptoms got worse he went to [facility] rheumatology where he was tried on several medications and nothing worked.  He was also seen by Dr. Rockwell Alexandria who also prescribed short-term medications which were not helpful.  He recalls being on Enbrel at 1 time.  He states due to insurance coverage he could not get Enbrel long-term.  He was diagnosed with ulcerative colitis by Dr. Fuller Plan about 5 years ago.  He has been on sulfasalazine 1 g twice daily for ulcerative colitis.  He still has intermittent blood in stool.  He states about 3 months ago he had an accident where he bumped his head and fell on his left knee.  Since then his left knee has been painful and swollen.  He has been using crutches.  He had ACL repair 30 years ago.  He had recent MRI which also showed meniscal tear and suprapatellar effusion.  He was evaluated by Dr. Amedeo Kinsman.  He did not recommend surgery at this point.  Patient states that he had left knee joint aspirations x2 and was also diagnosed with a Baker's cyst.  Patient continues to have discomfort in  his lower back.  He had MRI in the past which was consistent with degenerative disc disease of the lumbar spine and mild spinal stenosis.  He also has discomfort in his bilateral wrist joints and hands.  He has some arthritis in his feet.  Besides his left knee joint nothing else is swollen.  There is no history of psoriasis.  He started having new onset seizures and October 2022.  He was diagnosed with temporal lobe epilepsy by Dr. Delice Lesch and has been on [place].  There is no family history of autoimmune disease.  He has 1 child who is in good health.  Activities of Daily Living:  Patient reports morning stiffness for 30 minutes.   Patient Denies nocturnal pain.  Difficulty dressing/grooming: Reports Difficulty climbing stairs: Reports Difficulty getting out of chair: Reports Difficulty using hands for taps, buttons, cutlery, and/or writing: Denies  Review of Systems  Constitutional:  Negative for fatigue.  HENT:  Positive for mouth dryness.   Eyes:  Negative for dryness.  Respiratory:  Negative for shortness of breath.   Cardiovascular:  Positive for swelling in legs/feet.  Gastrointestinal:  Negative for constipation and diarrhea.  Endocrine: Negative for increased urination.  Genitourinary:  Negative for difficulty urinating.  Musculoskeletal:  Positive for joint pain, gait problem, joint pain, joint swelling and morning stiffness.  Skin:  Negative for color change, rash and sensitivity to sunlight.  Allergic/Immunologic: Negative for susceptible to infections.  Neurological:  Negative for numbness.  Hematological:  Negative for bruising/bleeding tendency.  Psychiatric/Behavioral:  Positive for depressed mood and sleep disturbance. The patient is not nervous/anxious.    PMFS History:  Patient Active Problem List   Diagnosis Date Noted   Syncope and collapse 04/12/2021   Rectal bleeding 05/07/2019   Strain of left pectoralis muscle 02/13/2017   Rheumatoid arthritis ([place])     Past  Medical History:  Diagnosis Date   Arthritis    GERD (gastroesophageal reflux disease)    Rheumatoid arthritis(714.0)    Seizures ([place]) 12/2020   Ulcerative colitis ([place])     Family History  Problem Relation Age of Onset   Alcoholism Mother    Colon cancer Neg Hx    Esophageal cancer Neg Hx    Stomach cancer Neg Hx    Rectal cancer Neg Hx    Past Surgical History:  Procedure Laterality Date   ANTERIOR CRUCIATE LIGAMENT REPAIR     Left knee   COLONOSCOPY     HERNIA REPAIR  2020   Social History   Social History Narrative   Daily caffeine    Right handed    Lives with girlfriend.    1 son.     There is no immunization history on file for this patient.   Objective: Vital Signs: BP 119/76 (BP Location: Right Arm, Patient Position: Sitting, Cuff Size: Small)   Pulse 93   Resp 13   Ht 5' 4.5" (1.638 m)   Wt 133 lb 9.6 oz (60.6 kg)   BMI 22.58 kg/m    Physical Exam Vitals and nursing note reviewed.  Constitutional:      Appearance: He is well-developed.  HENT:     Head: Normocephalic and atraumatic.  Eyes:     Conjunctiva/sclera: Conjunctivae normal.     Pupils: Pupils are equal, round, and reactive to light.  Cardiovascular:     Rate and Rhythm: Normal rate and regular rhythm.     Heart sounds: Normal heart sounds.  Pulmonary:     Effort: Pulmonary effort is normal.     Breath sounds: Normal breath sounds.  Abdominal:     General: Bowel sounds are normal.     Palpations: Abdomen is soft.  Musculoskeletal:     Cervical back: Normal range of motion and neck supple.  Skin:    General: Skin is warm and dry.     Capillary Refill: Capillary refill takes less than 2 seconds.     Comments: Nail dystrophy was noted in bilateral hands and feet   Neurological:     Mental Status: He is alert and oriented to person, place, and time.  Psychiatric:        Behavior: Behavior normal.     Musculoskeletal Exam: C-spine was in good range of motion.  Lumbar spine was  difficult to assess as he was having difficulty standing.  He had thickening of the right sternoclavicular joint with no synovitis.  Shoulder joints and elbow joints were in good range of motion.  He had limited range of motion of bilateral wrist joints with synovial thickening and no synovitis.  There was no synovitis or synovial thickening over MCP joints.  Bilateral PIP and DIP thickening with no synovitis was noted.  He  had incomplete fist formation bilaterally.  Hip joints were in good range of motion.  Right knee joint was in good range of motion.  Left knee joint had painful limited range of motion with warmth swelling and effusion.  There was no tenderness over Achillis tendon or plantar fascia.  There was no swelling or tenderness over ankles.  He had bilateral first and fifth MTP thickening and PIP and DIP thickening with no synovitis.  CDAI Exam: CDAI Score: -- Patient Global: --; Provider Global: -- Swollen: --; Tender: -- Joint Exam 09/15/2021   No joint exam has been documented for this visit   There is currently no information documented on the homunculus. Go to the Rheumatology activity and complete the homunculus joint exam.  Investigation: No additional findings.  Imaging: MR Knee Left  Wo Contrast  Result Date: 08/18/2021 CLINICAL DATA:  Knee pain, chronic degenerative disease. Prior ACL repair. EXAM: MRI OF THE LEFT KNEE WITHOUT CONTRAST TECHNIQUE: Multiplanar, multisequence MR imaging of the left was performed. No intravenous contrast was administered. COMPARISON:  Radiographs dated July 02, 2021 FINDINGS: MENISCI Medial: Advanced degenerative changes of the body/posterior horn of the medial meniscus. Lateral: Degeneration/diminution of the body/posterior horn of the lateral meniscus. LIGAMENTS Cruciates: Postsurgical changes for prior ACL graft, without visualization of the graft consistent with full-thickness re-tear. PCL is intact. Collaterals: Medial collateral ligament is  intact. Lateral collateral ligament complex is intact. CARTILAGE Patellofemoral:  No chondral defect. Medial: Full-thickness cartilage defect of the medial tibial plateau with subchondral edema. Lateral: Full-thickness cartilage defect of the tibial plateau and femoral condyle with subchondral edema. JOINT: Large joint effusion. Mild edema of the Hoffa's fat pad. Plical thickening concerning for synovitis. POPLITEAL FOSSA: Popliteus tendon is intact. Large Baker's cyst measuring approximately 2.5 x 1.0 x 4.6 cm. EXTENSOR MECHANISM: Intact quadriceps tendon. Intact patellar tendon. Intact lateral patellar retinaculum. Intact medial patellar retinaculum. Intact MPFL. BONES: No aggressive osseous lesion. No fracture or dislocation. Tricompartmental marginal osteophytes. Other: No fluid collection or hematoma. Muscles are normal. IMPRESSION: 1.  Full-thickness ACL graft tear. 2. Moderate medial tibiofemoral and patellofemoral osteoarthritis characterized by complex tears of the body/posterior horn of the medial and lateral menisci, articular cartilage thinning and subchondral edema with marginal osteophytes. 3.  Large suprapatellar joint effusion with synovitis. 4.  Baker's cyst measuring approximately 2.5 x 1.0 x 4.6 cm. Electronically Signed   By: Keane Police D.O.   On: 08/18/2021 15:11   XR Foot 2 Views Left  Result Date: 09/15/2021 Juxta-articular osteopenia was noted.  Severe first MTP narrowing with spurring was noted.  Third MTP narrowing and subluxation was noted.  PIP and DIP narrowing was noted.  Metatarsal tarsal joint narrowing was noted.  No intertarsal tibiotalar or subtalar joint space narrowing was noted. Impression: These findings are consistent with rheumatoid arthritis and osteoarthritis overlap.  XR Foot 2 Views Right  Result Date: 09/15/2021 Juxta-articular osteopenia was noted.  Severe narrowing of first MTP joint with subluxation and spurring was noted.  Shortening of second proximal  phalanx was noted.  PIP and DIP narrowing was noted.  Dorsal spurring was noted.  No significant intertarsal, tibiotalar or subtalar joint space narrowing was noted. Impression: These findings are consistent with rheumatoid arthritis and osteoarthritis overlap.  XR Hand 2 View Left  Result Date: 09/15/2021 Juxta-articular osteopenia was noted.  Severe intercarpal and radiocarpal joint space narrowing with erosive changes in all of the carpal bones and ulnar styloid was noted.  Severe CMC narrowing and subluxation was  noted.  Narrowing of all MCP joints with invagination of second and third MCP joints was noted.  Narrowing of all PIP joints was noted.  Mild narrowing of the DIP joints was noted.  Subluxation of first and second PIP joints was noted. Impression: These findings are consistent with severe erosive rheumatoid arthritis and osteoarthritis overlap.  XR Hand 2 View Right  Result Date: 09/15/2021 Juxta-articular osteopenia was noted.  Severe intercarpal and radiocarpal joint space narrowing with fusion of the intercarpal and radiocarpal joints joints was noted.  Erosive changes were noted in the carpal bones.  Severe CMC narrowing and subluxation was noted.  First MCP narrowing and subluxation was noted.  Second and third MCP narrowing was noted.  Narrowing of all PIP joints with subluxation of first second and third PIP joints was noted.  No significant DIP narrowing was noted.  Subluxation of the fourth DIP joint was noted. Impression: These findings are consistent with severe erosive rheumatoid arthritis and osteoarthritis overlap.   Recent Labs: Lab Results  Component Value Date   WBC 15.2 (H) 01/21/2021   HGB 13.4 01/21/2021   PLT 321 01/21/2021   NA 137 02/11/2021   K 3.8 02/11/2021   CL 107 02/11/2021   CO2 25 02/11/2021   GLUCOSE 108 (H) 02/11/2021   BUN 7 02/11/2021   CREATININE 0.76 02/11/2021   BILITOT 0.8 01/21/2021   ALKPHOS 55 01/21/2021   AST 18 01/21/2021   ALT 12  01/21/2021   PROT 6.6 01/21/2021   ALBUMIN 3.8 01/21/2021   CALCIUM 9.0 02/11/2021   GFRAA 94 05/04/2019   November 12, 2016 chest x-ray unremarkable September 21, 2020 RMSF negative, Borrelia IgG and IgM negative  Speciality Comments: No specialty comments available.  Procedures:  No procedures performed Allergies: Gold-containing drug products and Naprosyn [naproxen]   Assessment / Plan:     Visit Diagnoses: Rheumatoid arthritis of multiple sites, unspecified whether rheumatoid factor present-patient complains of pain and discomfort in multiple joints since he was in his mid 76s.  He states that he saw several rheumatologists in the past.  He was initially seen at [facility] and was placed on prednisone for almost 2 years.  He states he tapered prednisone himself and did not go for follow-up visit.  He was also seen at [facility] for many years where he was placed on different medications but he does not recall the name of the medications.  He was later seen by Dr. Rockwell Alexandria in [place] who also tried many medications including Enbrel per patient.  He states that Enbrel was discontinued due to lack of insurance coverage.  He is not taking any medications for rheumatoid arthritis for at least 20 years.  In anticipation to start him on more aggressive therapy I will obtain labs today.  Plan to discuss possible use of Humira at the follow-up visit as Humira will control his symptoms of rheumatoid arthritis and ulcerative colitis.  Pain in both hands -he complains of pain and discomfort in his bilateral hands.  He states that he had to go on disability due to severe arthritis in his hands.  He had synovial thickening over bilateral wrist joints, PIP and DIP joints.  No synovitis was noted.  He had incomplete fist formation bilaterally.  Plan: XR Hand 2 View Right, XR Hand 2 View Left.  X-rays showed severe erosive rheumatoid arthritis involving bilateral hands.  Effusion, left knee -he had  warmth swelling and effusion in his left knee joint today.  Patient developed left knee joint effusion after the fall about 3 months ago.  He states he has had knee joint aspiration and injections twice.  He also had MRI of his left knee joint which was consistent with meniscal tear.  He gives history of meniscal tear repair in the past about 30 years ago.  Aspirated by Dr. Amedeo Kinsman 08/29/21.  The joint was injected with 40 mg of Depo-Medrol no synovial fluid analysis is available.  MRI: Large suprapatellar joint effusion with synovitis and baker's cyst - Plan: Sedimentation rate, Rheumatoid factor, Cyclic citrul peptide antibody, IgG, ANA, Uric acid.  As he was having a lot of discomfort I will give him a prednisone taper starting at 20 mg and taper by 5 mg every 4 days.  Primary osteoarthritis of left knee - MRI 08/18/21:Moderate medial tibiofemoral and patellofemoral osteoarthritischaracterized by complex tears of the body/posterior horn of themedial and lateral  Pain in both feet -he has gone discomfort in his feet.  He denies any history of feet swelling.  Plan: XR Foot 2 Views Right, XR Foot 2 Views Left.  X-rays were consistent with rheumatoid arthritis and osteoarthritis overlap.  Pain of right sternoclavicular joint-patient states he has been in the right sternoclavicular joint for many years.  He had x-rays in the past and no diagnosis was made.  He had synovial thickening but no synovitis.  History of ulcerative colitis-diagnosed 5 years ago by Dr. Fuller Plan.  He has been on sulfasalazine 1 g p.o. twice daily.  He still has intermittent bleeding.  He denies any diarrhea or constipation today.  High risk medication use - Sulfasalazine 1 g p.o. twice daily- Plan: CBC with Differential/Platelet, COMPLETE METABOLIC PANEL WITH GFR, Hepatitis B core antibody, IgM, Hepatitis B surface antigen, Hepatitis C antibody, QuantiFERON-TB Gold Plus, Serum protein electrophoresis with reflex, IgG, IgA, IgM, HIV Antibody  (routine testing w rflx)  DDD (degenerative disc disease), lumbar -he has chronic lower back pain.  February 18, 2021 MRI of the lumbar spine showed multilevel spondylosis, facet joint arthropathy and mild spinal stenosis.  Temporal lobe epilepsy [facility]) -new onset seizures a started October 2022.  He is followed by Dr. Delice Lesch.  He is on Keppra.  Patient denies any history of recent seizures.  Syncope-patient had a syncopal episode and fell which resulted in her left knee injury.  He had a cardiology work-up which was negative.  Smoker - 1/2 PPDx 30 years  Other fatigue - Plan: CK, TSH  Orders: Orders Placed This Encounter  Procedures   XR Hand 2 View Right   XR Hand 2 View Left   XR Foot 2 Views Right   XR Foot 2 Views Left   CBC with Differential/Platelet   COMPLETE METABOLIC PANEL WITH GFR   Sedimentation rate   CK   TSH   Rheumatoid factor   Cyclic citrul peptide antibody, IgG   ANA   Uric acid   Hepatitis B core antibody, IgM   Hepatitis B surface antigen   Hepatitis C antibody   QuantiFERON-TB Gold Plus   Serum protein electrophoresis with reflex   IgG, IgA, IgM   HIV Antibody (routine testing w rflx)   Meds ordered this encounter  Medications   predniSONE (DELTASONE) 5 MG tablet    Sig: Take 4 tabs po x 4 days, 3  tabs po x 4 days, 2  tabs po x 4 days, 1  tab po x 4 days    Dispense:  40 tablet  Refill:  0    Face-to-face time spent with patient was 60 minutes. Greater than 50% of time was spent in counseling and coordination of care.  (His records from orthopedics, cardiologist, neurologist, all previous x-rays and MRIs were reviewed)  Follow-Up Instructions: Return for Rheumatoid arthritis, ulcerative colitis.   Bo Merino, MD  Note - This record has been created using Editor, commissioning.  Chart creation errors have been sought, but may not always  have been located. Such creation errors do not reflect on  the standard of medical care.

## 2021-09-15 ENCOUNTER — Ambulatory Visit (INDEPENDENT_AMBULATORY_CARE_PROVIDER_SITE_OTHER): Payer: Medicare HMO

## 2021-09-15 ENCOUNTER — Ambulatory Visit: Payer: Medicare HMO | Admitting: Rheumatology

## 2021-09-15 ENCOUNTER — Encounter: Payer: Self-pay | Admitting: Rheumatology

## 2021-09-15 VITALS — BP 119/76 | HR 93 | Resp 13 | Ht 64.5 in | Wt 133.6 lb

## 2021-09-15 DIAGNOSIS — G40109 Localization-related (focal) (partial) symptomatic epilepsy and epileptic syndromes with simple partial seizures, not intractable, without status epilepticus: Secondary | ICD-10-CM | POA: Diagnosis not present

## 2021-09-15 DIAGNOSIS — M79671 Pain in right foot: Secondary | ICD-10-CM

## 2021-09-15 DIAGNOSIS — M25462 Effusion, left knee: Secondary | ICD-10-CM

## 2021-09-15 DIAGNOSIS — M1712 Unilateral primary osteoarthritis, left knee: Secondary | ICD-10-CM

## 2021-09-15 DIAGNOSIS — R55 Syncope and collapse: Secondary | ICD-10-CM

## 2021-09-15 DIAGNOSIS — F17200 Nicotine dependence, unspecified, uncomplicated: Secondary | ICD-10-CM | POA: Diagnosis not present

## 2021-09-15 DIAGNOSIS — M069 Rheumatoid arthritis, unspecified: Secondary | ICD-10-CM

## 2021-09-15 DIAGNOSIS — M79672 Pain in left foot: Secondary | ICD-10-CM

## 2021-09-15 DIAGNOSIS — M068A Other specified rheumatoid arthritis, other specified site: Secondary | ICD-10-CM

## 2021-09-15 DIAGNOSIS — M79641 Pain in right hand: Secondary | ICD-10-CM | POA: Diagnosis not present

## 2021-09-15 DIAGNOSIS — Z79899 Other long term (current) drug therapy: Secondary | ICD-10-CM | POA: Diagnosis not present

## 2021-09-15 DIAGNOSIS — M5136 Other intervertebral disc degeneration, lumbar region: Secondary | ICD-10-CM | POA: Diagnosis not present

## 2021-09-15 DIAGNOSIS — M79642 Pain in left hand: Secondary | ICD-10-CM | POA: Diagnosis not present

## 2021-09-15 DIAGNOSIS — Z8719 Personal history of other diseases of the digestive system: Secondary | ICD-10-CM | POA: Diagnosis not present

## 2021-09-15 DIAGNOSIS — M25511 Pain in right shoulder: Secondary | ICD-10-CM | POA: Diagnosis not present

## 2021-09-15 DIAGNOSIS — R5383 Other fatigue: Secondary | ICD-10-CM | POA: Diagnosis not present

## 2021-09-15 MED ORDER — PREDNISONE 5 MG PO TABS: ORAL_TABLET | ORAL | 0 refills | Status: DC

## 2021-09-15 NOTE — Patient Instructions (Signed)
If you have knee joint aspiration again please have the synovial fluid tested for-cell count, crystals and culture.

## 2021-09-16 ENCOUNTER — Encounter: Payer: Self-pay | Admitting: Rheumatology

## 2021-09-19 ENCOUNTER — Encounter: Payer: Self-pay | Admitting: Family Medicine

## 2021-09-21 LAB — CBC WITH DIFFERENTIAL/PLATELET
Absolute Monocytes: 825 cells/uL (ref 200–950)
Basophils Absolute: 79 cells/uL (ref 0–200)
Basophils Relative: 0.6 %
Eosinophils Absolute: 459 cells/uL (ref 15–500)
Eosinophils Relative: 3.5 %
HCT: 44.7 % (ref 38.5–50.0)
Hemoglobin: 14.5 g/dL (ref 13.2–17.1)
Lymphs Abs: 2437 cells/uL (ref 850–3900)
MCH: 31.3 pg (ref 27.0–33.0)
MCHC: 32.4 g/dL (ref 32.0–36.0)
MCV: 96.3 fL (ref 80.0–100.0)
MPV: 10.2 fL (ref 7.5–12.5)
Monocytes Relative: 6.3 %
Neutro Abs: 9301 cells/uL — ABNORMAL HIGH (ref 1500–7800)
Neutrophils Relative %: 71 %
Platelets: 389 10*3/uL (ref 140–400)
RBC: 4.64 10*6/uL (ref 4.20–5.80)
RDW: 13.2 % (ref 11.0–15.0)
Total Lymphocyte: 18.6 %
WBC: 13.1 10*3/uL — ABNORMAL HIGH (ref 3.8–10.8)

## 2021-09-21 LAB — SEDIMENTATION RATE: Sed Rate: 67 mm/h — ABNORMAL HIGH (ref 0–20)

## 2021-09-21 LAB — PROTEIN ELECTROPHORESIS, SERUM, WITH REFLEX
Albumin ELP: 4 g/dL (ref 3.8–4.8)
Alpha 1: 0.3 g/dL (ref 0.2–0.3)
Alpha 2: 0.8 g/dL (ref 0.5–0.9)
Beta 2: 0.8 g/dL — ABNORMAL HIGH (ref 0.2–0.5)
Beta Globulin: 0.6 g/dL (ref 0.4–0.6)
Gamma Globulin: 1.5 g/dL (ref 0.8–1.7)
Total Protein: 8.1 g/dL (ref 6.1–8.1)

## 2021-09-21 LAB — COMPLETE METABOLIC PANEL WITH GFR
AG Ratio: 1.1 (calc) (ref 1.0–2.5)
ALT: 30 U/L (ref 9–46)
AST: 47 U/L — ABNORMAL HIGH (ref 10–35)
Albumin: 4.1 g/dL (ref 3.6–5.1)
Alkaline phosphatase (APISO): 126 U/L (ref 35–144)
BUN: 7 mg/dL (ref 7–25)
CO2: 15 mmol/L — ABNORMAL LOW (ref 20–32)
Calcium: 9.8 mg/dL (ref 8.6–10.3)
Chloride: 102 mmol/L (ref 98–110)
Creat: 0.72 mg/dL (ref 0.70–1.35)
Globulin: 3.8 g/dL (calc) — ABNORMAL HIGH (ref 1.9–3.7)
Glucose, Bld: 169 mg/dL — ABNORMAL HIGH (ref 65–99)
Potassium: 4.1 mmol/L (ref 3.5–5.3)
Sodium: 136 mmol/L (ref 135–146)
Total Bilirubin: 0.4 mg/dL (ref 0.2–1.2)
Total Protein: 7.9 g/dL (ref 6.1–8.1)
eGFR: 105 mL/min/{1.73_m2} (ref >=60)

## 2021-09-21 LAB — CK: Total CK: 27 U/L — ABNORMAL LOW (ref 44–196)

## 2021-09-21 LAB — CYCLIC CITRUL PEPTIDE ANTIBODY, IGG: Cyclic Citrullin Peptide Ab: <16 UNITS

## 2021-09-21 LAB — IGG, IGA, IGM
IgG (Immunoglobin G), Serum: 1590 mg/dL (ref 600–1640)
IgM, Serum: 68 mg/dL (ref 50–300)
Immunoglobulin A: 885 mg/dL — ABNORMAL HIGH (ref 47–310)

## 2021-09-21 LAB — HEPATITIS B SURFACE ANTIGEN: Hepatitis B Surface Ag: NONREACTIVE

## 2021-09-21 LAB — HIV ANTIBODY (ROUTINE TESTING W REFLEX): HIV 1&2 Ab, 4th Generation: NONREACTIVE

## 2021-09-21 LAB — HEPATITIS C ANTIBODY
Hepatitis C Ab: NONREACTIVE
SIGNAL TO CUT-OFF: 0.08 (ref <1.00)

## 2021-09-21 LAB — QUANTIFERON-TB GOLD PLUS
Mitogen-NIL: 5.28 IU/mL
NIL: 0.06 IU/mL
QuantiFERON-TB Gold Plus: NEGATIVE
TB1-NIL: 0.01 IU/mL
TB2-NIL: 0 IU/mL

## 2021-09-21 LAB — TSH: TSH: 0.52 mIU/L (ref 0.40–4.50)

## 2021-09-21 LAB — RHEUMATOID FACTOR: Rheumatoid fact SerPl-aCnc: <14 IU/mL (ref <14)

## 2021-09-21 LAB — ANTI-NUCLEAR AB-TITER (ANA TITER): ANA Titer 1: 1:40 {titer} — ABNORMAL HIGH

## 2021-09-21 LAB — URIC ACID: Uric Acid, Serum: 6.4 mg/dL (ref 4.0–8.0)

## 2021-09-21 LAB — HEPATITIS B CORE ANTIBODY, IGM: Hep B C IgM: NONREACTIVE

## 2021-09-21 LAB — IFE INTERPRETATION: Immunofix Electr Int: NOT DETECTED

## 2021-09-21 LAB — ANA: Anti Nuclear Antibody (ANA): POSITIVE — AB

## 2021-09-21 NOTE — Progress Notes (Signed)
I will discuss results at the follow-up visit.

## 2021-10-02 ENCOUNTER — Ambulatory Visit (INDEPENDENT_AMBULATORY_CARE_PROVIDER_SITE_OTHER): Payer: Medicare HMO | Admitting: Family Medicine

## 2021-10-02 ENCOUNTER — Encounter: Payer: Self-pay | Admitting: Family Medicine

## 2021-10-02 ENCOUNTER — Telehealth: Payer: Self-pay | Admitting: Orthopedic Surgery

## 2021-10-02 VITALS — BP 128/98 | HR 85 | Ht 64.5 in | Wt 138.0 lb

## 2021-10-02 DIAGNOSIS — M0579 Rheumatoid arthritis with rheumatoid factor of multiple sites without organ or systems involvement: Secondary | ICD-10-CM

## 2021-10-02 DIAGNOSIS — M25462 Effusion, left knee: Secondary | ICD-10-CM

## 2021-10-02 DIAGNOSIS — M25562 Pain in left knee: Secondary | ICD-10-CM

## 2021-10-02 DIAGNOSIS — G8929 Other chronic pain: Secondary | ICD-10-CM

## 2021-10-02 MED ORDER — HYDROCODONE-ACETAMINOPHEN 7.5-325 MG PO TABS: 1.0000 | ORAL_TABLET | Freq: Four times a day (QID) | ORAL | 0 refills | Status: DC | PRN

## 2021-10-02 NOTE — Progress Notes (Signed)
Subjective:    Patient ID: Terry Guerrero, male    DOB: 02-02-1961, 61 y.o.   MRN: [id number]  HPI Previous found to have syncope due to seizures currently on keppra.  Cardiology evaluation completed.  Patient denies any other seizure activity since he started the [place].  He states that he has been doing well on the [place] and has had no further fainting or staring episodes.  However unfortunately he recently injured his left knee.  He has been dealing with chronic arthritis and joint pains stemming from rheumatoid arthritis for years.  However recently he fell and landed directly on his left knee suffering an injury.  Please see the MRI report April.  He tore his ACL.  He tore the medial and lateral meniscus.  On top of that he has a large joint effusion and synovitis.  Orthopedics is recommended a knee replacement.  They are trying to schedule surgery.  However tramadol is upsetting his stomach and causing him to vomit.  He is only taking 2 a day but he cannot tolerate it.  He has been taking a lot of Tylenol and BC powders and aspirin but this is not relieving the pain. Past Medical History:  Diagnosis Date   Arthritis    GERD (gastroesophageal reflux disease)    Rheumatoid arthritis(714.0)    Seizures ([place]) 12/2020   Ulcerative colitis ([place])    Past Surgical History:  Procedure Laterality Date   ANTERIOR CRUCIATE LIGAMENT REPAIR     Left knee   COLONOSCOPY     HERNIA REPAIR  2020   Current Outpatient Medications on File Prior to Visit  Medication Sig Dispense Refill   APPLE CIDER VINEGAR PO Take 1 tablet by mouth 2 (two) times daily.     b complex vitamins capsule Take 1 capsule by mouth daily.     folic acid (FOLVITE) 1 MG tablet Take 1 tablet (1 mg total) by mouth daily. 30 tablet 11   levETIRAcetam (KEPPRA) 500 MG tablet Take 1 tablet (500 mg total) by mouth 2 (two) times daily. 180 tablet 3   predniSONE (DELTASONE) 5 MG tablet Take 4 tabs po x 4 days, 3  tabs po x 4 days, 2  tabs  po x 4 days, 1  tab po x 4 days 40 tablet 0   sulfaSALAzine (AZULFIDINE) 500 MG tablet TAKE 2 TABLETS BY MOUTH TWICE DAILY 120 tablet 11   vitamin C (ASCORBIC ACID) 250 MG tablet Take 500 mg by mouth daily.     Zinc 100 MG TABS Take 1 tablet by mouth daily.     No current facility-administered medications on file prior to visit.   Allergies  Allergen Reactions   Gold-Containing Drug Products    Naprosyn [Naproxen]    Social History   Socioeconomic History   Marital status: Divorced    Spouse name: Not on file   Number of children: 1   Years of education: Not on file   Highest education level: Not on file  Occupational History   Occupation: Disabled   Tobacco Use   Smoking status: Every Day    Packs/day: 1.00    Years: 25.00    Total pack years: 25.00    Types: Cigarettes   Smokeless tobacco: Never  Vaping Use   Vaping Use: Never used  Substance and Sexual Activity   Alcohol use: Not Currently    Comment: occasional   Drug use: Yes    Frequency: 12.0 times per week  Types: Hydrocodone, Marijuana    Comment: daily   Sexual activity: Yes    Birth control/protection: None  Other Topics Concern   Not on file  Social History Narrative   Daily caffeine    Right handed    Lives with girlfriend.    1 son.    Social Determinants of Health   Financial Resource Strain: Low Risk  (05/12/2021)   Overall Financial Resource Strain (CARDIA)    Difficulty of Paying Living Expenses: Not very hard  Food Insecurity: No Food Insecurity (05/12/2021)   Hunger Vital Sign    Worried About Running Out of Food in the Last Year: Never true    Ran Out of Food in the Last Year: Never true  Transportation Needs: No Transportation Needs (05/12/2021)   PRAPARE - Hydrologist (Medical): No    Lack of Transportation (Non-Medical): No  Physical Activity: Sufficiently Active (05/12/2021)   Exercise Vital Sign    Days of Exercise per Week: 5 days    Minutes of  Exercise per Session: 30 min  Stress: No Stress Concern Present (05/12/2021)   [place]    Feeling of Stress : Not at all  Social Connections: Moderately Isolated (05/12/2021)   Social Connection and Isolation Panel [NHANES]    Frequency of Communication with Friends and Family: More than three times a week    Frequency of Social Gatherings with Friends and Family: More than three times a week    Attends Religious Services: Never    Marine scientist or Organizations: No    Attends Archivist Meetings: Never    Marital Status: Living with partner  Intimate Partner Violence: Not At Risk (05/12/2021)   Humiliation, Afraid, Rape, and Kick questionnaire    Fear of Current or Ex-Partner: No    Emotionally Abused: No    Physically Abused: No    Sexually Abused: No     Review of Systems  All other systems reviewed and are negative.      Objective:   Physical Exam Vitals reviewed. Exam conducted with a chaperone present.  Constitutional:      Appearance: He is well-developed.  Cardiovascular:     Rate and Rhythm: Normal rate and regular rhythm.     Heart sounds: Normal heart sounds.  Pulmonary:     Effort: Pulmonary effort is normal. No respiratory distress.     Breath sounds: Normal breath sounds. No stridor. No wheezing.  Abdominal:     General: Abdomen is flat. Bowel sounds are normal. There is no distension.     Palpations: Abdomen is soft.     Tenderness: There is no abdominal tenderness. There is no guarding.  Genitourinary:    Rectum: Normal. No mass, tenderness, anal fissure, external hemorrhoid or internal hemorrhoid. Normal anal tone.  Musculoskeletal:     Lumbar back: Spasms, tenderness and bony tenderness present. Decreased range of motion. Negative right straight leg raise test and negative left straight leg raise test.     Left knee: Effusion present. Decreased range of motion.  Tenderness present over the medial joint line and lateral joint line.  Neurological:     General: No focal deficit present.     Mental Status: He is alert and oriented to person, place, and time.     Sensory: Sensation is intact.     Motor: Motor function is intact.     Coordination: Coordination is intact.  Gait: Gait is intact.     Deep Tendon Reflexes: Reflexes are normal and symmetric.           Assessment & Plan:  Acute pain of left knee Patient has multifactorial knee pain stemming from an ACL tear, medial and lateral meniscal tears, as well as reactive synovitis and arthritis.  Orthopedics is recommended a knee replacement which is being scheduled.  Patient is requesting something for pain given the fact that over-the-counter medications are not working and tramadol is upsetting his stomach.  I will give the patient Norco 7.5/325 1 p.o. twice daily.  60 tablets will last 1 month.  If he has to take the medication for longer than 1 month I would recommend rechecking liver function test given the mild elevation in his AST on his last lab work in May.  Blood pressure is elevated today however has not previously been elevated I believe this is due to pain.

## 2021-10-02 NOTE — Telephone Encounter (Signed)
This morning, 10/02/21, received voice message, which was left at 7:57pm 10/01/21, Sunday evening, from fiance, designated contact on file, relaying that patient is still in so much pain, that the medication prescribed is not helping enough, and that Dr Amedeo Kinsman was to reach out to one of his colleagues. States needs a call from [place] this morning.* *Note: chart indicates patient is at his PCP office this morning. Please advise.

## 2021-10-02 NOTE — Telephone Encounter (Signed)
Order entered

## 2021-10-02 NOTE — Addendum Note (Signed)
Addended by: Jenna Luo T on: 10/02/2021 08:33 AM   Modules accepted: Orders

## 2021-10-04 ENCOUNTER — Encounter: Payer: Self-pay | Admitting: Family Medicine

## 2021-10-04 NOTE — Telephone Encounter (Signed)
Spoke with pt who states he has seen his PCP and Rheumatology but he's having muscle spasms that are causing him severe.

## 2021-10-04 NOTE — Telephone Encounter (Signed)
Left msg on pt VM for a call back.

## 2021-10-04 NOTE — Telephone Encounter (Signed)
This morning, 10/04/21, voice message from 10/03/21 at 5:39pm, was received from patient's designated contact/fiancee [name] - [phone], who relayed that 'this man is in a great deal of pain'; states he cannot take Tramadol due to 'adverse reaction' - vomiting. States patient was seen by RA specialist, and said also was seen by primary care, Dr Dennard Schaumann. States yesterday, 10/03/21, Dr Dennard Schaumann ordered pain medication and also ordered a bone density test.  She stated 'Dr Dennard Schaumann asked why Dr Amedeo Kinsman didn't order this test'.  States expects a call this morning at either her number (states she has a Dr appointment this morning 10/04/21) so we may call patient's number [phone]

## 2021-10-05 ENCOUNTER — Other Ambulatory Visit: Payer: Self-pay | Admitting: Family Medicine

## 2021-10-05 MED ORDER — CYCLOBENZAPRINE HCL 5 MG PO TABS: 5.0000 mg | ORAL_TABLET | Freq: Three times a day (TID) | ORAL | 1 refills | Status: DC | PRN

## 2021-10-06 DIAGNOSIS — M25562 Pain in left knee: Secondary | ICD-10-CM | POA: Diagnosis not present

## 2021-10-08 NOTE — Progress Notes (Signed)
Office Visit Note  Patient: Terry Guerrero             Date of Birth: 1960-08-12           MRN: [id number]             PCP: Donita Brooks, MD Referring: Donita Brooks, MD Visit Date: 10/16/2021 Occupation: @GUAROCC @  Subjective:  Pain in multiple joints  History of Present Illness: Terry Guerrero is a 61 y.o. male with history of seronegative rheumatoid arthritis and ulcerative colitis.  He states he continues to have pain and discomfort in multiple joints.  He complains of pain in bilateral hands, left knee joint and his bilateral feet.  He states left knee joint pain has been severe.  He has been using a brace and also using crutches.  He has intermittent discomfort in his right sternoclavicular joint.  He had been taking over-the-counter ibuprofen and aspirin for pain relief.  He states he had prednisone in the past but did not like the side effects of prednisone.  Activities of Daily Living:  Patient reports morning stiffness for 0 minutes.   Patient Reports nocturnal pain.  Difficulty dressing/grooming: Reports Difficulty climbing stairs: Reports Difficulty getting out of chair: Reports Difficulty using hands for taps, buttons, cutlery, and/or writing: Denies  Review of Systems  Constitutional:  Positive for fatigue.  HENT:  Positive for mouth dryness. Negative for mouth sores.   Eyes: Negative.  Negative for dryness.  Respiratory: Negative.  Negative for shortness of breath.   Cardiovascular: Negative.  Negative for chest pain and palpitations.  Gastrointestinal:  Positive for blood in stool. Negative for constipation and diarrhea.  Endocrine: Negative for increased urination.  Genitourinary:  Negative for difficulty urinating.  Musculoskeletal:  Positive for joint pain, gait problem, joint pain and joint swelling.  Skin: Negative.  Negative for color change, redness and sensitivity to sunlight.  Allergic/Immunologic: Negative.  Negative for susceptible to infections.   Neurological:  Positive for parasthesias.       Knee/ foot left   Hematological: Negative.  Negative for swollen glands.  Psychiatric/Behavioral:  Positive for depressed mood and sleep disturbance. Negative for suicidal ideas and homicidal ideas. The patient is not nervous/anxious.     PMFS History:  Patient Active Problem List   Diagnosis Date Noted   Syncope and collapse 04/12/2021   Rectal bleeding 05/07/2019   Strain of left pectoralis muscle 02/13/2017   Rheumatoid arthritis (HCC)     Past Medical History:  Diagnosis Date   Arthritis    GERD (gastroesophageal reflux disease)    Rheumatoid arthritis(714.0)    Seizures (HCC) 12/2020   Ulcerative colitis (HCC)     Family History  Problem Relation Age of Onset   Alcoholism Mother    Colon cancer Neg Hx    Esophageal cancer Neg Hx    Stomach cancer Neg Hx    Rectal cancer Neg Hx    Past Surgical History:  Procedure Laterality Date   ANTERIOR CRUCIATE LIGAMENT REPAIR     Left knee   COLONOSCOPY     HERNIA REPAIR  2020   Social History   Social History Narrative   Daily caffeine    Right handed    Lives with girlfriend.    1 son.     There is no immunization history on file for this patient.   Objective: Vital Signs: BP 116/79   Pulse 94   Ht 5\' 3"  (1.6 m)   Wt  138 lb (62.6 kg)   BMI 24.45 kg/m    Physical Exam Vitals and nursing note reviewed.  Constitutional:      Appearance: He is well-developed.  HENT:     Head: Normocephalic and atraumatic.  Eyes:     Conjunctiva/sclera: Conjunctivae normal.     Pupils: Pupils are equal, round, and reactive to light.  Cardiovascular:     Rate and Rhythm: Normal rate and regular rhythm.     Heart sounds: Normal heart sounds.  Pulmonary:     Effort: Pulmonary effort is normal.     Breath sounds: Normal breath sounds.  Abdominal:     General: Bowel sounds are normal.     Palpations: Abdomen is soft.  Musculoskeletal:     Cervical back: Normal range of  motion and neck supple.  Skin:    General: Skin is warm and dry.     Capillary Refill: Capillary refill takes less than 2 seconds.  Neurological:     Mental Status: He is alert and oriented to person, place, and time.  Psychiatric:        Behavior: Behavior normal.      Musculoskeletal Exam: C-spine was in good range of motion.  He had thickening of the right sternoclavicular joint.  Shoulder joints and elbow joints in good range of motion.  He had limited range of motion of bilateral wrist joints with synovial thickening.  There was no synovitis over MCP joints but synovial thickening was noted.  Bilateral PIP and DIP thickening was noted.  He had incomplete fist formation bilaterally.  Hip joints in good range of motion.  He had warmth and swelling of his left knee joint.  He had no tenderness over ankles or MTPs.   CDAI Exam: CDAI Score: -- Patient Global: --; Provider Global: -- Swollen: --; Tender: -- Joint Exam 10/16/2021   No joint exam has been documented for this visit   There is currently no information documented on the homunculus. Go to the Rheumatology activity and complete the homunculus joint exam.  Investigation: No additional findings.  Imaging: No results found.  Recent Labs: Lab Results  Component Value Date   WBC 13.1 (H) 09/15/2021   HGB 14.5 09/15/2021   PLT 389 09/15/2021   NA 136 09/15/2021   K 4.1 09/15/2021   CL 102 09/15/2021   CO2 15 (L) 09/15/2021   GLUCOSE 169 (H) 09/15/2021   BUN 7 09/15/2021   CREATININE 0.72 09/15/2021   BILITOT 0.4 09/15/2021   ALKPHOS 55 01/21/2021   AST 47 (H) 09/15/2021   ALT 30 09/15/2021   PROT 7.9 09/15/2021   PROT 8.1 09/15/2021   ALBUMIN 3.8 01/21/2021   CALCIUM 9.8 09/15/2021   GFRAA 94 05/04/2019   QFTBGOLDPLUS NEGATIVE 09/15/2021   Sep 15, 2021 IFE normal, TB Gold negative, immunoglobulins IgA elevated 885, hepatitis B-, hepatitis C negative, HIV negative, TSH normal, CK 27, ESR 67, ANA 1:40 NS, RF  negative, anti-CCP negative, uric acid 6.4  Speciality Comments: Enbrel by Dr. Coral Spikes  Procedures:  No procedures performed Allergies: Gold-containing drug products and Naprosyn [naproxen]   Assessment / Plan:     Visit Diagnoses: Rheumatoid arthritis of multiple sites with negative rheumatoid factor (HCC) -RF negative, anti-CCP negative, elevated sedimentation rate, severe erosive RA: dxd in his 72s. Ttd at [facility] later by Dr. Coral Spikes with Enbrel.  He has not received any treatment for rheumatoid arthritis for the last 60yrs. he had a detailed discussion regarding rheumatoid arthritis.  Different  treatment options and their side effects were discussed.  With history of ulcerative colitis Humira will be a good choice.  Indications side effects contraindications of Humira were discussed at length.  Handout was given and consent was taken.  We will apply for Humira.  Labs obtained during the last visit were also discussed at length. Medication counseling:   TB Test: Sep 15, 2021 Hepatitis panel: Sep 15, 2021 HIV: Sep 15, 2021 SPEP: Sep 15, 2021 Immunoglobulins: Sep 15, 2021  Chest x-ray: November 12, 2016  Does patient have diagnosis of heart failure?  No  Counseled patient that Humira is a TNF blocking agent.  Reviewed Humira dose of 40 mg every other week.  Counseled patient on purpose, proper use, and adverse effects of Humira.  Reviewed the most common adverse effects including infections, headache, and injection site reactions. Discussed that there is the possibility of an increased risk of malignancy but it is not well understood if this increased risk is due to the medication or the disease state.  Advised patient to get yearly dermatology exams due to risk of skin cancer.  Reviewed the importance of regular labs while on Humira therapy.  Advised patient to get standing labs one month after starting Humira then every 3 months.  Provided patient with standing lab orders.  Counseled patient that  Humira should be held prior to scheduled surgery.  Counseled patient to avoid live vaccines while on Humira.  Advised patient to get annual influenza vaccine and the pneumococcal vaccine as needed.  Provided patient with medication education material and answered all questions.  Patient voiced understanding.  Patient consented to Humira.  Will upload consent into the media tab.  Reviewed storage instructions of Humira.  Advised initial injection must be administered in office.    Patient voiced understanding.      High risk medication use - Sulfasalazine 1 g p.o. twice daily.  Plan is to add Humira 40 mg subcu every other week once approved.  Information on immunization was placed in the AVS.  He was also advised to stop Humira if he develops an infection and resume after the infection resolves.  Pain in both hands -he had bilateral MCP PIP and DIP thickening.  Bilateral wrist joint thickening.  He had limited range of motion in his wrist joints and incomplete fist formation due to severe osteoarthritis and rheumatoid arthritis.  He had no active synovitis in his hands or wrist joints.  Clinical and radiographic findings were consistent with osteoarthritis and rheumatoid arthritis.  X-ray findings were discussed with the patient.  Effusion, left knee-he had aspiration and injection by the orthopedic surgeon.  He has recurrence of knee joint effusion.  Patient states he gets side effects from prednisone.  He has had prednisone in the past.  Primary osteoarthritis of left knee - MRI August 18, 2021 showed moderate OA and meniscal tear.  Pain in both feet -he continues to have discomfort in his feet.  No synovitis was noted.  X-rays were consistent with osteoarthritis and rheumatoid arthritis overlap.  X-ray findings were discussed with the patient at length.  Pain of right sternoclavicular joint - Synovial thickening was noted.  History of ulcerative colitis - He is on sulfasalazine 1 g p.o. twice daily.   He still has intermittent bleeding.  He denies any diarrhea or constipation today.  He has been followed by Dr. Russella Dar.  DDD (degenerative disc disease), lumbar-he has chronic lower back pain.  He has history of spondylosis, facet joint  arthropathy and spinal stenosis.  Temporal lobe epilepsy [facility]) - New onset seizures started October 2022.  He is on Keppra.  Followed by Dr. Karel Jarvis.  Syncope and collapse - Cardiology work-up was negative.  Smoker - Half pack per day for 30 years.  Smoking cessation was discussed at length.  Association of smoking with rheumatoid arthritis was discussed.  Orders: No orders of the defined types were placed in this encounter.  No orders of the defined types were placed in this encounter.    Follow-Up Instructions: Return in about 6 weeks (around 11/27/2021) for Rheumatoid arthritis, Osteoarthritis.   Pollyann Savoy, MD  Note - This record has been created using Animal nutritionist.  Chart creation errors have been sought, but may not always  have been located. Such creation errors do not reflect on  the standard of medical care.

## 2021-10-13 ENCOUNTER — Ambulatory Visit: Payer: Medicare HMO | Admitting: Rheumatology

## 2021-10-16 ENCOUNTER — Encounter: Payer: Self-pay | Admitting: Rheumatology

## 2021-10-16 ENCOUNTER — Telehealth: Payer: Self-pay | Admitting: Pharmacist

## 2021-10-16 ENCOUNTER — Ambulatory Visit: Payer: Medicare HMO | Admitting: Rheumatology

## 2021-10-16 ENCOUNTER — Other Ambulatory Visit (HOSPITAL_COMMUNITY): Payer: Self-pay

## 2021-10-16 VITALS — BP 116/79 | HR 94 | Ht 63.0 in | Wt 138.0 lb

## 2021-10-16 DIAGNOSIS — M79641 Pain in right hand: Secondary | ICD-10-CM

## 2021-10-16 DIAGNOSIS — Z8719 Personal history of other diseases of the digestive system: Secondary | ICD-10-CM | POA: Diagnosis not present

## 2021-10-16 DIAGNOSIS — G40109 Localization-related (focal) (partial) symptomatic epilepsy and epileptic syndromes with simple partial seizures, not intractable, without status epilepticus: Secondary | ICD-10-CM

## 2021-10-16 DIAGNOSIS — M79672 Pain in left foot: Secondary | ICD-10-CM

## 2021-10-16 DIAGNOSIS — M25511 Pain in right shoulder: Secondary | ICD-10-CM

## 2021-10-16 DIAGNOSIS — Z79899 Other long term (current) drug therapy: Secondary | ICD-10-CM | POA: Diagnosis not present

## 2021-10-16 DIAGNOSIS — R55 Syncope and collapse: Secondary | ICD-10-CM

## 2021-10-16 DIAGNOSIS — F17200 Nicotine dependence, unspecified, uncomplicated: Secondary | ICD-10-CM | POA: Diagnosis not present

## 2021-10-16 DIAGNOSIS — M5136 Other intervertebral disc degeneration, lumbar region: Secondary | ICD-10-CM

## 2021-10-16 DIAGNOSIS — M79642 Pain in left hand: Secondary | ICD-10-CM

## 2021-10-16 DIAGNOSIS — M25462 Effusion, left knee: Secondary | ICD-10-CM

## 2021-10-16 DIAGNOSIS — M79671 Pain in right foot: Secondary | ICD-10-CM

## 2021-10-16 DIAGNOSIS — M0609 Rheumatoid arthritis without rheumatoid factor, multiple sites: Secondary | ICD-10-CM | POA: Diagnosis not present

## 2021-10-16 DIAGNOSIS — M1712 Unilateral primary osteoarthritis, left knee: Secondary | ICD-10-CM

## 2021-10-16 NOTE — Progress Notes (Signed)
Pharmacy Note Subjective: Patient presents today to [facility] for follow up office visit. Patient seen by the pharmacist for counseling on Humira for rheumatoid arthritis and ulcerative colitis .  Prior therapy includes: Enbrel, sulfasalazine.  Diagnosis of heart failure: No  Objective:  CBC    Component Value Date/Time   WBC 13.1 (H) 09/15/2021 0957   RBC 4.64 09/15/2021 0957   HGB 14.5 09/15/2021 0957   HCT 44.7 09/15/2021 0957   PLT 389 09/15/2021 0957   MCV 96.3 09/15/2021 0957   MCH 31.3 09/15/2021 0957   MCHC 32.4 09/15/2021 0957   RDW 13.2 09/15/2021 0957   LYMPHSABS 2,437 09/15/2021 0957   MONOABS 1.0 08/28/2019 0947   EOSABS 459 09/15/2021 0957   BASOSABS 79 09/15/2021 0957     CMP     Component Value Date/Time   NA 136 09/15/2021 0957   K 4.1 09/15/2021 0957   CL 102 09/15/2021 0957   CO2 15 (L) 09/15/2021 0957   GLUCOSE 169 (H) 09/15/2021 0957   BUN 7 09/15/2021 0957   CREATININE 0.72 09/15/2021 0957   CALCIUM 9.8 09/15/2021 0957   PROT 7.9 09/15/2021 0957   PROT 8.1 09/15/2021 0957   ALBUMIN 3.8 01/21/2021 1820   AST 47 (H) 09/15/2021 0957   ALT 30 09/15/2021 0957   ALKPHOS 55 01/21/2021 1820   BILITOT 0.4 09/15/2021 0957   GFRNONAA >60 02/11/2021 2028   GFRNONAA 81 05/04/2019 1020   GFRAA 94 05/04/2019 1020      Baseline Immunosuppressant Therapy Labs TB GOLD    Latest Ref Rng & Units 09/15/2021    9:57 AM  Quantiferon TB Gold  Quantiferon TB Gold Plus NEGATIVE NEGATIVE    Hepatitis Panel    Latest Ref Rng & Units 09/15/2021    9:57 AM  Hepatitis  Hep B Surface Ag NON-REACTIVE NON-REACTIVE   Hep B IgM NON-REACTIVE NON-REACTIVE   Hep C Ab NON-REACTIVE NON-REACTIVE    HIV Lab Results  Component Value Date   HIV NON-REACTIVE 09/15/2021   Immunoglobulins    Latest Ref Rng & Units 09/15/2021    9:57 AM  Immunoglobulin Electrophoresis  IgA  47 - 310 mg/dL 782   IgG 956 - 2,130 mg/dL 8,657   IgM 50 - 846 mg/dL 68     SPEP    Latest Ref Rng & Units 09/15/2021    9:57 AM  Serum Protein Electrophoresis  Total Protein 6.1 - 8.1 g/dL 6.1 - 8.1 g/dL 7.9    8.1   Albumin 3.8 - 4.8 g/dL 4.0   Alpha-1 0.2 - 0.3 g/dL 0.3   Alpha-2 0.5 - 0.9 g/dL 0.8   Beta Globulin 0.4 - 0.6 g/dL 0.6   Beta 2 0.2 - 0.5 g/dL 0.8   Gamma Globulin 0.8 - 1.7 g/dL 1.5    N6EX No results found for: "G6PDH" TPMT No results found for: "TPMT"   Chest x-ray: 11/12/16- No active cardiopulmonary disease.  Assessment/Plan:  Counseled patient that Humira is a TNF blocking agent.  Counseled patient on purpose, proper use, and adverse effects of Humira.  Reviewed the most common adverse effects including infections, headache, and injection site reactions. Discussed that there is the possibility of an increased risk of malignancy including non-melanoma skin cancer but it is not well understood if this increased risk is due to the medication or the disease state.  Advised patient to get yearly dermatology exams due to risk of skin cancer. Counseled patient that Humira should be held  prior to scheduled surgery.  Counseled patient to avoid live vaccines while on Humira.   Reviewed the importance of regular labs while on Humira therapy. Will monitor CBC and CMP 1 month after starting and then every 3 months routinely thereafter. Will monitor TB gold annually. Standing orders placed. Provided patient with medication education material and answered all questions.  Patient consented to Humira.  Will upload consent into the media tab.  Reviewed storage instructions of Humira.  Advised initial injection must be administered in office.  Patient verbalized understanding.   Dermatology referral will be placed at new start visit.  Patient stated he does not currently have a surgery scheduled but they are considering doing surgery on his knee. Has self-reported recurrent nail bed fungal infections that he has not had treated yet. Recommended he see his  primary care physician regarding this. Advised that if he has any active infections he cannot start or take his Humira dose.   Dose will be for rheumatoid arthritis Humira 40 mg every 14 days.  Prescription pending lab results and/or insurance approval.  Patient will continue taking sulfasalazine 1,000 mg twice daily in addition to Humira 40 mg SQ every 14 days.

## 2021-10-18 ENCOUNTER — Encounter: Payer: Self-pay | Admitting: Gastroenterology

## 2021-10-21 ENCOUNTER — Encounter: Payer: Self-pay | Admitting: Family Medicine

## 2021-10-21 ENCOUNTER — Other Ambulatory Visit: Payer: Self-pay | Admitting: Family Medicine

## 2021-10-22 ENCOUNTER — Other Ambulatory Visit: Payer: Self-pay | Admitting: Rheumatology

## 2021-10-23 ENCOUNTER — Other Ambulatory Visit: Payer: Self-pay | Admitting: Family Medicine

## 2021-10-23 NOTE — Telephone Encounter (Signed)
Spoke with pt, per pt he has already used all his meds for 10/05/21 and already has picked his last refill. Advice pt that it is too early for another refill.   Pt voiced understanding and nothing at this time.

## 2021-10-25 MED ORDER — HYDROCODONE-ACETAMINOPHEN 7.5-325 MG PO TABS: 1.0000 | ORAL_TABLET | Freq: Four times a day (QID) | ORAL | 0 refills | Status: DC | PRN

## 2021-10-27 NOTE — Telephone Encounter (Signed)
Called [employer] for update on patient's application. Received a fax from  [place] regarding an approval for Terry Guerrero patient assistance from 10/27/21 to 04/22/22.   Phone: [phone], option 1, option 0  ATC patient to schedule new start and confirm he has no upcoming surgery or any ongoing infections. Left VM requesting return call.  Knox Saliva, PharmD, MPH, BCPS, CPP Clinical Pharmacist (Rheumatology and Pulmonology)

## 2021-10-31 NOTE — Patient Instructions (Signed)
Your next HUMIRA dose is due on 11/15/21, 11/29/21, and every 14 days thereafter  CONTINUE sulfasalazine 1042m twice daily  HOLD HUMIRA if you have signs or symptoms of an infection. You can resume once you feel better or back to your baseline. HOLD HUMIRA if you start antibiotics to treat an infection. HOLD HUMIRA around the time of surgery/procedures. Your surgeon will be able to provide recommendations on when to hold BEFORE and when you are cleared [place]  Pharmacy information: Your prescription will be shipped from [facility] Their phone number is [phone] call to schedule shipment and confirm address. You will need your medicine shipped to your home by 11/15/21. They will mail your medication to your home.  Labs are due in 1 month then every 3 months. Lab hours are from Monday to Thursday 1:30-4:30pm and Friday 1:30-4pm. You do not need an appointment if you come for labs during these times.  How to manage an injection site reaction: Remember the 5 C's: COUNTER - leave on the counter at least 30 minutes but up to overnight to bring medication to room temperature. This may help prevent stinging COLD - place something cold (like an ice gel pack or cold water bottle) on the injection site just before cleansing with alcohol. This may help reduce pain CLARITIN - use Claritin (generic name is loratadine) for the first two weeks of treatment or the day of, the day before, and the day after injecting. This will help to minimize injection site reactions CORTISONE CREAM - apply if injection site is irritated and itching CALL ME - if injection site reaction is bigger than the size of your fist, looks infected, blisters, or if you develop hives

## 2021-10-31 NOTE — Telephone Encounter (Signed)
Called patient to schedule Humira new start viist. Phone went straight to VM - left VM requesting return call  Knox Saliva, PharmD, MPH, BCPS, CPP Clinical Pharmacist (Rheumatology and Pulmonology)

## 2021-10-31 NOTE — Progress Notes (Unsigned)
Pharmacy Note  Subjective:   Patient presents to clinic today to receive first dose of Humira for rheumatoid arthritis and ulcerative colitis. Patient currently takes sulfasalazine 1g twice daily.  Patient running a fever or have signs/symptoms of infection? No  Patient currently on antibiotics for the treatment of infection? No  Patient have any upcoming invasive procedures/surgeries? No  Objective: CMP     Component Value Date/Time   NA 136 09/15/2021 0957   K 4.1 09/15/2021 0957   CL 102 09/15/2021 0957   CO2 15 (L) 09/15/2021 0957   GLUCOSE 169 (H) 09/15/2021 0957   BUN 7 09/15/2021 0957   CREATININE 0.72 09/15/2021 0957   CALCIUM 9.8 09/15/2021 0957   PROT 7.9 09/15/2021 0957   PROT 8.1 09/15/2021 0957   ALBUMIN 3.8 01/21/2021 1820   AST 47 (H) 09/15/2021 0957   ALT 30 09/15/2021 0957   ALKPHOS 55 01/21/2021 1820   BILITOT 0.4 09/15/2021 0957   GFRNONAA >60 02/11/2021 2028   GFRNONAA 81 05/04/2019 1020   GFRAA 94 05/04/2019 1020    CBC    Component Value Date/Time   WBC 13.1 (H) 09/15/2021 0957   RBC 4.64 09/15/2021 0957   HGB 14.5 09/15/2021 0957   HCT 44.7 09/15/2021 0957   PLT 389 09/15/2021 0957   MCV 96.3 09/15/2021 0957   MCH 31.3 09/15/2021 0957   MCHC 32.4 09/15/2021 0957   RDW 13.2 09/15/2021 0957   LYMPHSABS 2,437 09/15/2021 0957   MONOABS 1.0 08/28/2019 0947   EOSABS 459 09/15/2021 0957   BASOSABS 79 09/15/2021 0957    Baseline Immunosuppressant Therapy Labs TB GOLD    Latest Ref Rng & Units 09/15/2021    9:57 AM  Quantiferon TB Gold  Quantiferon TB Gold Plus NEGATIVE NEGATIVE    Hepatitis Panel    Latest Ref Rng & Units 09/15/2021    9:57 AM  Hepatitis  Hep B Surface Ag NON-REACTIVE NON-REACTIVE   Hep B IgM NON-REACTIVE NON-REACTIVE   Hep C Ab NON-REACTIVE NON-REACTIVE    HIV Lab Results  Component Value Date   HIV NON-REACTIVE 09/15/2021   Immunoglobulins    Latest Ref Rng & Units 09/15/2021    9:57 AM  Immunoglobulin  Electrophoresis  IgA  47 - 310 mg/dL 885   IgG 600 - 1,640 mg/dL 1,590   IgM 50 - 300 mg/dL 68    SPEP    Latest Ref Rng & Units 09/15/2021    9:57 AM  Serum Protein Electrophoresis  Total Protein 6.1 - 8.1 g/dL 6.1 - 8.1 g/dL 7.9    8.1   Albumin 3.8 - 4.8 g/dL 4.0   Alpha-1 0.2 - 0.3 g/dL 0.3   Alpha-2 0.5 - 0.9 g/dL 0.8   Beta Globulin 0.4 - 0.6 g/dL 0.6   Beta 2 0.2 - 0.5 g/dL 0.8   Gamma Globulin 0.8 - 1.7 g/dL 1.5    Chest x-ray: 11/12/16 - No active cardiopulmonary disease.  Assessment/Plan:  Demonstrated proper injection technique with Humira demo device  Patient able to demonstrate proper injection technique using the teach back method.  Patient self injected in the **** with:  Sample Medication: *** NDC: *** Lot: *** Expiration: **  Patient tolerated well.  Observed for 30 mins in office for adverse reaction and ***.   Patient is to return in 1 month for labs and 6-8 weeks for follow-up appointment.  Standing orders placed.   Humira approved through patient assistance .   Rx sent to: [facility]  for Humira/Rinvoq/Skyrizi: [id number] (3 month supply sent with patient assistance application).  Patient provided with pharmacy phone number and advised to call later this week to schedule shipment to home.  Patient will continue Humira 70m SQ every 14 days days in combination with sulfasalazine 1g twice daily.  All questions encouraged and answered.  Instructed patient to call with any further questions or concerns.  DKnox Saliva PharmD, MPH, BCPS, CPP Clinical Pharmacist (Rheumatology and Pulmonology)  10/31/2021 11:53 AM

## 2021-10-31 NOTE — Telephone Encounter (Signed)
Patient returned call. He states he is not sure if he has nailbed infection. No procedures upcoming. No use of antibiotics right now  Humira new start scheduled for 11/01/21. Will use sample for first dose.  Knox Saliva, PharmD, MPH, BCPS, CPP Clinical Pharmacist (Rheumatology and Pulmonology)

## 2021-11-01 ENCOUNTER — Ambulatory Visit (INDEPENDENT_AMBULATORY_CARE_PROVIDER_SITE_OTHER): Payer: Medicare HMO | Admitting: Pharmacist

## 2021-11-01 VITALS — BP 114/62 | HR 89

## 2021-11-01 DIAGNOSIS — M0609 Rheumatoid arthritis without rheumatoid factor, multiple sites: Secondary | ICD-10-CM

## 2021-11-01 DIAGNOSIS — Z7189 Other specified counseling: Secondary | ICD-10-CM

## 2021-11-01 DIAGNOSIS — Z79899 Other long term (current) drug therapy: Secondary | ICD-10-CM

## 2021-11-01 DIAGNOSIS — Z8719 Personal history of other diseases of the digestive system: Secondary | ICD-10-CM

## 2021-11-01 MED ORDER — HUMIRA (2 PEN) 40 MG/0.4ML ~~LOC~~ AJKT: 40.0000 mg | AUTO-INJECTOR | SUBCUTANEOUS | 0 refills | Status: DC

## 2021-11-06 ENCOUNTER — Ambulatory Visit: Payer: Medicare HMO | Admitting: Neurology

## 2021-11-06 ENCOUNTER — Encounter: Payer: Self-pay | Admitting: Neurology

## 2021-11-06 VITALS — BP 122/76 | HR 88 | Ht 66.0 in | Wt 135.0 lb

## 2021-11-06 DIAGNOSIS — G40009 Localization-related (focal) (partial) idiopathic epilepsy and epileptic syndromes with seizures of localized onset, not intractable, without status epilepticus: Secondary | ICD-10-CM | POA: Diagnosis not present

## 2021-11-06 MED ORDER — LEVETIRACETAM 1000 MG PO TABS: 1000.0000 mg | ORAL_TABLET | Freq: Two times a day (BID) | ORAL | 3 refills | Status: DC
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
## 2021-11-06 NOTE — Patient Instructions (Signed)
Increase Levetiracetam to 1012m twice a day. With your current Levetiracetam 5085m Take 2 tablets twice a day. Once done, your new bottle will be for Levetiracetam 100017mtake 1 tablet twice a day.  2. Keep a calendar of your seizures  3. Follow-up in 3 months, call for any changes.   Seizure Precautions: 1. If medication has been prescribed for you to prevent seizures, take it exactly as directed.  Do not stop taking the medicine without talking to your doctor first, even if you have not had a seizure in a long time.   2. Avoid activities in which a seizure would cause danger to yourself or to others.  Don't operate dangerous machinery, swim alone, or climb in high or dangerous places, such as on ladders, roofs, or girders.  Do not drive unless your doctor says you may.  3. If you have any warning that you may have a seizure, lay down in a safe place where you can't hurt yourself.    4.  No driving for 6 months from last seizure, as [facility] Please refer to the following link on the [place] for more information: [email or web address]/[email or web address]/[email or web address]   5.  Maintain good sleep hygiene. Avoid alcohol.  6.  Contact your doctor if you have any problems that may be related to the medicine you are taking.  7.  Call 911 and bring the patient back to the ED if:        A.  The seizure lasts longer than 5 minutes.       B.  The patient doesn't awaken shortly after the seizure  C.  The patient has new problems such as difficulty seeing, speaking or moving  D.  The patient was injured during the seizure  E.  The patient has a temperature over 102 F (39C)  F.  The patient vomited and now is having trouble breathing

## 2021-11-07 ENCOUNTER — Ambulatory Visit
Admission: RE | Admit: 2021-11-07 | Discharge: 2021-11-07 | Disposition: A | Discharge status: Home / Self Care | Payer: Medicare HMO | Source: Ambulatory Visit | Attending: Family Medicine | Admitting: Family Medicine

## 2021-11-07 DIAGNOSIS — M0579 Rheumatoid arthritis with rheumatoid factor of multiple sites without organ or systems involvement: Secondary | ICD-10-CM

## 2021-11-07 DIAGNOSIS — M85852 Other specified disorders of bone density and structure, left thigh: Secondary | ICD-10-CM | POA: Diagnosis not present

## 2021-11-08 ENCOUNTER — Encounter: Payer: Self-pay | Admitting: Family Medicine

## 2021-11-20 ENCOUNTER — Other Ambulatory Visit: Payer: Self-pay | Admitting: Family Medicine

## 2021-11-21 MED ORDER — HYDROCODONE-ACETAMINOPHEN 7.5-325 MG PO TABS: 1.0000 | ORAL_TABLET | Freq: Four times a day (QID) | ORAL | 0 refills | Status: DC | PRN

## 2021-11-27 ENCOUNTER — Ambulatory Visit: Payer: Medicare HMO | Admitting: Physician Assistant

## 2021-11-28 ENCOUNTER — Encounter (HOSPITAL_BASED_OUTPATIENT_CLINIC_OR_DEPARTMENT_OTHER): Payer: Self-pay | Admitting: Physical Therapy

## 2021-11-28 ENCOUNTER — Ambulatory Visit (HOSPITAL_BASED_OUTPATIENT_CLINIC_OR_DEPARTMENT_OTHER): Payer: Medicare HMO | Attending: Orthopedic Surgery | Admitting: Physical Therapy

## 2021-11-28 DIAGNOSIS — R2689 Other abnormalities of gait and mobility: Secondary | ICD-10-CM | POA: Insufficient documentation

## 2021-11-28 DIAGNOSIS — M25562 Pain in left knee: Secondary | ICD-10-CM | POA: Insufficient documentation

## 2021-11-28 DIAGNOSIS — G8929 Other chronic pain: Secondary | ICD-10-CM | POA: Insufficient documentation

## 2021-11-28 DIAGNOSIS — M25662 Stiffness of left knee, not elsewhere classified: Secondary | ICD-10-CM | POA: Insufficient documentation

## 2021-11-28 NOTE — Therapy (Addendum)
OUTPATIENT PHYSICAL THERAPY LOWER EXTREMITY EVALUATION/Discharge    Patient Name: Terry Guerrero MRN: [id number] DOB:03/18/1961, 61 y.o., male Today's Date: 11/29/2021   PT End of Session - 11/29/21 0804     Visit Number 1    Number of Visits 16    Date for PT Re-Evaluation 01/24/22    PT Start Time 1600    PT Stop Time 7408    PT Time Calculation (min) 43 min    Activity Tolerance Patient tolerated treatment well    Behavior During Therapy [facility] for tasks assessed/performed             Past Medical History:  Diagnosis Date   Arthritis    GERD (gastroesophageal reflux disease)    Rheumatoid arthritis(714.0)    Seizures ([place]) 12/2020   Ulcerative colitis ([place])    Past Surgical History:  Procedure Laterality Date   ANTERIOR CRUCIATE LIGAMENT REPAIR     Left knee   COLONOSCOPY     HERNIA REPAIR  2020   Patient Active Problem List   Diagnosis Date Noted   Syncope and collapse 04/12/2021   Rectal bleeding 05/07/2019   Strain of left pectoralis muscle 02/13/2017   Rheumatoid arthritis ([place])     PCP: Dr Susy Frizzle   REFERRING PROVIDER: Jordan Likes   REFERRING DIAG: Left Knee Pain   THERAPY DIAG:  Chronic pain of left knee  Stiffness of left knee, not elsewhere classified  Other abnormalities of gait and mobility  Rationale for Evaluation and Treatment Rehabilitation  ONSET DATE:   SUBJECTIVE:   SUBJECTIVE STATEMENT: The patient fell about 6 months ago and began having significant knee pain. He has a long history of RA. His RA effects his hands as well. He has been using crutches since about two weeks after the fall. He feels like when he doesn't his leg buckles.  PERTINENT HISTORY: Seizures; ACL repair of the left knee; RA  PAIN:  Are you having pain? Yes: NPRS scale: 0/10 right now. Can reach high levels when he weight bears Pain location: on the medial and lateral portion of the knee  Pain description: aching  Aggravating factors:  Standing and walking  Relieving factors: not standing on it   PRECAUTIONS: None  WEIGHT BEARING RESTRICTIONS   FALLS:  Has patient fallen in last 6 months? Yes. Number of falls 1 the crutch gave our   LIVING ENVIRONMENT: 3 steps into the back 9 into the front  OCCUPATION:  On disability  Hobbies: walking without crutches  PLOF: Independent  PATIENT GOALS  To get back to life    OBJECTIVE:   DIAGNOSTIC FINDINGS:    PATIENT SURVEYS:  FOTO 51%  65 expected in 14 visits   COGNITION:  Overall cognitive status: Within functional limits for tasks assessed     SENSATION: WFL  EDEMA:  Circumferential:    MUSCLE LENGTH:  POSTURE: No Significant postural limitations  PALPATION:  No signifcant tenderness to palpation    LOWER EXTREMITY ROM:  Passive ROM Right eval Left eval  Hip flexion    Hip extension    Hip abduction    Hip adduction    Hip internal rotation    Hip external rotation    Knee flexion  40  Knee extension  -5  Ankle dorsiflexion    Ankle plantarflexion    Ankle inversion    Ankle eversion     (Blank rows = not tested)  LOWER EXTREMITY MMT:  MMT Right eval Left  eval  Hip flexion    Hip extension    Hip abduction    Hip adduction    Hip internal rotation    Hip external rotation    Knee flexion    Knee extension    Ankle dorsiflexion    Ankle plantarflexion    Ankle inversion    Ankle eversion     (Blank rows = not tested) Unable to get accurate strength measurements. Patient is convinced that if he moves his knee it will cramp and what sounds like dislocate. Can perform a SLR   GAIT: Distance walked: Walking with crutches without significant weight bearing on the left.   Observation: Significant quad atrophy on the left  When asked about Seizures patients significant other reports he had one last night for 5 min. She reports they are focal seizures but he becomes unresponsive.   TODAY'S TREATMENT:  Exercises - Supine Quad  Set  - 3 x daily - 7 x weekly - 3 sets - 10 reps - Active Straight Leg Raise with Quad Set  - 3 x daily - 7 x weekly - 3 sets - 10 reps - Supine Heel Slide  - 3 x daily - 7 x weekly - 3 sets - 10 reps  PATIENT EDUCATION:  Education details: dangers of seizures in a pool, benefits of movement; importance of quad strengthening  Person educated: Patient and significant other  Education method: Explanation, Demonstration, Tactile cues, Verbal cues, and Handouts Education comprehension: verbalized understanding, returned demonstration, verbal cues required, tactile cues required, and needs further education   HOME EXERCISE PROGRAM: Access Code: [email or web address] URL: [email or web address] Date: 11/29/2021 Prepared by: Carolyne Littles  Exercises - Supine Quad Set  - 3 x daily - 7 x weekly - 3 sets - 10 reps - Active Straight Leg Raise with Quad Set  - 3 x daily - 7 x weekly - 3 sets - 10 reps - Supine Heel Slide  - 3 x daily - 7 x weekly - 3 sets - 10 reps  ASSESSMENT:  CLINICAL IMPRESSION: Patient is a 61 y.o. male who was seen today for physical therapy evaluation and treatment for stiffness, pain, and weakness of the left knee. A previous MRI shows a complete tear of his previously grafted ACL. He has significant limitations in flexion, strength, and functional weight bearing. He has not put weight on it for 6 months. He would be a great candidate for the pool, but he is epileptic with his latest seizure being on 8/7 and lasting in per patients significant other. We will likely not be able to do pool therapy. He would benefit form therapy on land but has significant kinesiophobia at this time with regards to movement of the knee. We will have our department manager speak with them regarding safety concerns with the pool and hopefull begin with land therapy as able.    OBJECTIVE IMPAIRMENTS Abnormal gait, decreased activity tolerance, decreased endurance, decreased mobility, difficulty  walking, decreased ROM, decreased strength, increased muscle spasms, and pain.   ACTIVITY LIMITATIONS carrying, lifting, bending, sitting, standing, squatting, stairs, transfers, bed mobility, dressing, and locomotion level  PARTICIPATION LIMITATIONS: cleaning, laundry, driving, shopping, occupation, and yard work  PERSONAL FACTORS Behavior pattern, Past/current experiences, Time since onset of injury/illness/exacerbation, and 3+ comorbidities: seizures, prior ACL repair; RA  are also affecting patient's functional outcome.   REHAB POTENTIAL: Poor will likely be unable to do water therapy and is convinced that he can not do land therapy  CLINICAL DECISION  MAKING: Unstable/unpredictable declining functional mobility. Per patient severe spasms that are unpredictable   EVALUATION COMPLEXITY: High   GOALS: Goals reviewed with patient? Yes  SHORT TERM GOALS: Target date: 12/27/2021  Patient will increase knee flexion on the left by 20 degrees  Baseline: Goal status: INITIAL  2.  Patient will transfer sit to stand using both legs  Baseline:  Goal status: INITIAL  3.  Patient will be independent with basic HEP  Baseline:  Goal status: INITIAL   LONG TERM GOALS: Target date: 01/24/2022   Patient will ambulate 1000' with LRAD with weight bearing on the left leg without buckling  Baseline:  Goal status: INITIAL  2.  Patient will demonstrate 90 degrees of left knee flexion in order to sit and stand better  Baseline:  Goal status: INITIAL  3.  Patient will stand for 10 min with LRAD without pain  Baseline:  Goal status: INITIAL    PLAN: PT FREQUENCY: 2x/week  PT DURATION: 8 weeks  PLANNED INTERVENTIONS: Therapeutic exercises, Therapeutic activity, Neuromuscular re-education, Balance training, Gait training, Patient/Family education, Self Care, Joint mobilization, Stair training, DME instructions, Aquatic Therapy( if cleared by management), Dry Needling, Cryotherapy, Moist heat,  Ultrasound, Ionotophoresis 23m/ml Dexamethasone, Manual therapy, and Re-evaluation  PLAN FOR NEXT SESSION: on land begin with gentle PROM; quad strengthening; hip strengthening , ankle strengthening; work on movements that he can complete to decrease Kinesiophobia.   PHYSICAL THERAPY DISCHARGE SUMMARY  Visits from Start of Care: 1  Current functional level related to goals / functional outcomes: Treatment deferred 2nd to active seizures. Patient declined land based therapy    Remaining deficits: Deferred    Education / Equipment: HEP    Patient agrees to discharge. Patient goals were not met. Patient is being discharged due to  water deferred .    DCarney Living PT 11/29/2021, 8:06 AM

## 2021-11-29 ENCOUNTER — Encounter (HOSPITAL_BASED_OUTPATIENT_CLINIC_OR_DEPARTMENT_OTHER): Payer: Self-pay | Admitting: Physical Therapy

## 2021-11-29 ENCOUNTER — Other Ambulatory Visit: Payer: Self-pay

## 2021-11-30 NOTE — Progress Notes (Unsigned)
Office Visit Note  Patient: Terry Guerrero             Date of Birth: [birth year]           MRN: [id number]             PCP: Susy Frizzle, MD Referring: Susy Frizzle, MD Visit Date: 12/13/2021 Occupation: @GUAROCC @  Subjective:  Medication monitoring  History of Present Illness: Terry Guerrero is a 61 y.o. male with history of seronegative rheumatoid arthritis and osteoarthritis.  Patient was started on Humira on 11/01/2021.  He remains on sulfasalazine 1 g twice daily.  Humira initiated on 11/01/21.  CBC and CMP drawn on 09/15/2021.  Patient is due to update lab work today.  Orders were released.  His next lab work will be due in November and every 3 months to monitor for drug toxicity.   TB Gold negative on 09/15/2021 and will continue to monitor yearly. Discussed the importance of holding Humira if he develops signs or symptoms of an infection and to resume once the infection has completely cleared.  Activities of Daily Living:  Patient reports morning stiffness for *** {minute/hour:19697}.   Patient {ACTIONS;DENIES/REPORTS:21021675::"Denies"} nocturnal pain.  Difficulty dressing/grooming: {ACTIONS;DENIES/REPORTS:21021675::"Denies"} Difficulty climbing stairs: {ACTIONS;DENIES/REPORTS:21021675::"Denies"} Difficulty getting out of chair: {ACTIONS;DENIES/REPORTS:21021675::"Denies"} Difficulty using hands for taps, buttons, cutlery, and/or writing: {ACTIONS;DENIES/REPORTS:21021675::"Denies"}  No Rheumatology ROS completed.   PMFS History:  Patient Active Problem List   Diagnosis Date Noted   Syncope and collapse 04/12/2021   Rectal bleeding 05/07/2019   Strain of left pectoralis muscle 02/13/2017   Rheumatoid arthritis ([place])     Past Medical History:  Diagnosis Date   Arthritis    GERD (gastroesophageal reflux disease)    Rheumatoid arthritis(714.0)    Seizures ([place]) 12/2020   Ulcerative colitis ([place])     Family History  Problem Relation Age of Onset   Alcoholism  Mother    Colon cancer Neg Hx    Esophageal cancer Neg Hx    Stomach cancer Neg Hx    Rectal cancer Neg Hx    Past Surgical History:  Procedure Laterality Date   ANTERIOR CRUCIATE LIGAMENT REPAIR     Left knee   COLONOSCOPY     HERNIA REPAIR  2020   Social History   Social History Narrative   Daily caffeine    Right handed    Lives with girlfriend. One story home   1 son.     There is no immunization history on file for this patient.   Objective: Vital Signs: There were no vitals taken for this visit.   Physical Exam Vitals and nursing note reviewed.  Constitutional:      Appearance: He is well-developed.  HENT:     Head: Normocephalic and atraumatic.  Eyes:     Conjunctiva/sclera: Conjunctivae normal.     Pupils: Pupils are equal, round, and reactive to light.  Cardiovascular:     Rate and Rhythm: Normal rate and regular rhythm.     Heart sounds: Normal heart sounds.  Pulmonary:     Effort: Pulmonary effort is normal.     Breath sounds: Normal breath sounds.  Abdominal:     General: Bowel sounds are normal.     Palpations: Abdomen is soft.  Musculoskeletal:     Cervical back: Normal range of motion and neck supple.  Skin:    General: Skin is warm and dry.     Capillary Refill: Capillary refill takes less than 2 seconds.  Neurological:     Mental Status: He is alert and oriented to person, place, and time.  Psychiatric:        Behavior: Behavior normal.      Musculoskeletal Exam: ***  CDAI Exam: CDAI Score: -- Patient Global: --; Provider Global: -- Swollen: --; Tender: -- Joint Exam 12/13/2021   No joint exam has been documented for this visit   There is currently no information documented on the homunculus. Go to the Rheumatology activity and complete the homunculus joint exam.  Investigation: No additional findings.  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 Images are obtained for bone mineral density measurement and are not obtained for diagnostic purposes. [id number] Exclusions: Left forearm due to technical limitations. COMPARISON:  None. FINDINGS: Scan quality: Good. LUMBAR SPINE (L1-L4): BMD (in g/cm2): 1.145 T-score: 0.5 Z-score: 1.1 LEFT FEMORAL NECK: BMD (in g/cm2): 0.733 T-score: -1.4 Z-score: -0.5 LEFT TOTAL HIP: BMD (in g/cm2): 0.896 T-score: -0.9 Z-score: -0.5 FRAX 10-YEAR PROBABILITY OF FRACTURE: 10-year fracture risk is performed using the [employer] FRAX calculator based on patient-reported risk factors. Major osteoporotic fracture: 5.4 % Hip fracture: 0.6 % IMPRESSION: Osteopenia based on BMD. World Health Organization T-score criteria for osteoporosis Normal = T-score at or above -1.0 SD. Low Bone Mass (Osteopenia) = T-score between -1.0 and -2.5 SD. Osteoporosis = T-score at or below -2.5 SD. Fracture risk is increased. Increased risk is based on low BMD. RECOMMENDATIONS: 1. All patients should optimize calcium and vitamin D intake. 2. Consider FDA-approved medical therapies in postmenopausal women and men aged 29 years and older, based on the following: - A hip or vertebral (clinical or morphometric) fracture - T-score less than or equal to -2.5 and secondary causes have been excluded. - Low bone mass (T-score between -1.0 and -2.5) and a 10-year probability of a hip fracture greater than or equal to 3% or a 10-year probability of a major osteoporosis-related fracture greater than or equal to 20% based on the [place]-adapted WHO algorithm. - Clinician judgment and/or patient preferences may indicate treatment for people with 10-year fracture probabilities above or below these levels 3. Patients with diagnosis of  osteoporosis or at high risk for fracture should have regular bone mineral density tests. For patients eligible for Medicare, routine testing is allowed once every 2 years. The testing frequency can be increased to one year for patients who have rapidly progressing disease, those who are receiving or discontinuing medical therapy to restore bone mass, or have additional risk factors. Electronically Signed   By: Nolon Nations M.D.   On: 11/09/2021 08:42    Recent Labs: Lab Results  Component Value Date   WBC 13.1 (H) 09/15/2021   HGB 14.5 09/15/2021   PLT 389 09/15/2021   NA 136 09/15/2021   K 4.1 09/15/2021   CL 102 09/15/2021   CO2 15 (L) 09/15/2021   GLUCOSE 169 (H) 09/15/2021   BUN 7 09/15/2021   CREATININE 0.72 09/15/2021   BILITOT 0.4 09/15/2021   ALKPHOS 55 01/21/2021   AST 47 (H) 09/15/2021   ALT 30 09/15/2021   PROT 7.9 09/15/2021   PROT 8.1 09/15/2021   ALBUMIN 3.8 01/21/2021   CALCIUM 9.8 09/15/2021   GFRAA 94 05/04/2019  QFTBGOLDPLUS NEGATIVE 09/15/2021    Speciality Comments: Enbrel by Dr. Rockwell [place] (discontinued due to loss of insurance), Humira started 11/01/21  Procedures:  No procedures performed Allergies: Gold-containing drug products and Naprosyn [naproxen]   Assessment / Plan:     Visit Diagnoses: No diagnosis found.  Orders: No orders of the defined types were placed in this encounter.  No orders of the defined types were placed in this encounter.   Face-to-face time spent with patient was *** minutes. Greater than 50% of time was spent in counseling and coordination of care.  Follow-Up Instructions: No follow-ups on file.   Earnestine Mealing, CMA  Note - This record has been created using Editor, commissioning.  Chart creation errors have been sought, but may not always  have been located. Such creation errors do not reflect on  the standard of medical care.

## 2021-12-13 ENCOUNTER — Ambulatory Visit: Payer: Medicare HMO | Attending: Physician Assistant | Admitting: Physician Assistant

## 2021-12-13 ENCOUNTER — Encounter: Payer: Self-pay | Admitting: Physician Assistant

## 2021-12-13 VITALS — BP 119/75 | HR 82 | Resp 16 | Ht 66.0 in | Wt 135.0 lb

## 2021-12-13 DIAGNOSIS — M1712 Unilateral primary osteoarthritis, left knee: Secondary | ICD-10-CM | POA: Diagnosis not present

## 2021-12-13 DIAGNOSIS — M5136 Other intervertebral disc degeneration, lumbar region: Secondary | ICD-10-CM | POA: Diagnosis not present

## 2021-12-13 DIAGNOSIS — M51369 Other intervertebral disc degeneration, lumbar region without mention of lumbar back pain or lower extremity pain: Secondary | ICD-10-CM

## 2021-12-13 DIAGNOSIS — M79641 Pain in right hand: Secondary | ICD-10-CM | POA: Diagnosis not present

## 2021-12-13 DIAGNOSIS — M79671 Pain in right foot: Secondary | ICD-10-CM

## 2021-12-13 DIAGNOSIS — M25462 Effusion, left knee: Secondary | ICD-10-CM | POA: Diagnosis not present

## 2021-12-13 DIAGNOSIS — Z79899 Other long term (current) drug therapy: Secondary | ICD-10-CM | POA: Diagnosis not present

## 2021-12-13 DIAGNOSIS — G40109 Localization-related (focal) (partial) symptomatic epilepsy and epileptic syndromes with simple partial seizures, not intractable, without status epilepticus: Secondary | ICD-10-CM

## 2021-12-13 DIAGNOSIS — Z8719 Personal history of other diseases of the digestive system: Secondary | ICD-10-CM

## 2021-12-13 DIAGNOSIS — M25511 Pain in right shoulder: Secondary | ICD-10-CM

## 2021-12-13 DIAGNOSIS — M0609 Rheumatoid arthritis without rheumatoid factor, multiple sites: Secondary | ICD-10-CM | POA: Diagnosis not present

## 2021-12-13 DIAGNOSIS — M79672 Pain in left foot: Secondary | ICD-10-CM

## 2021-12-13 DIAGNOSIS — R55 Syncope and collapse: Secondary | ICD-10-CM

## 2021-12-13 DIAGNOSIS — F17200 Nicotine dependence, unspecified, uncomplicated: Secondary | ICD-10-CM | POA: Diagnosis not present

## 2021-12-13 DIAGNOSIS — M79642 Pain in left hand: Secondary | ICD-10-CM

## 2021-12-13 NOTE — Progress Notes (Signed)
Absolute monocytes and eosinophils are borderline elevated. Rest of CBC WNL. We will continue to monitor.

## 2021-12-14 LAB — CBC WITH DIFFERENTIAL/PLATELET
Absolute Monocytes: 977 cells/uL — ABNORMAL HIGH (ref 200–950)
Basophils Absolute: 95 cells/uL (ref 0–200)
Basophils Relative: 0.9 %
Eosinophils Absolute: 536 cells/uL — ABNORMAL HIGH (ref 15–500)
Eosinophils Relative: 5.1 %
HCT: 40.5 % (ref 38.5–50.0)
Hemoglobin: 13.5 g/dL (ref 13.2–17.1)
Lymphs Abs: 3686 cells/uL (ref 850–3900)
MCH: 31.4 pg (ref 27.0–33.0)
MCHC: 33.3 g/dL (ref 32.0–36.0)
MCV: 94.2 fL (ref 80.0–100.0)
MPV: 10.2 fL (ref 7.5–12.5)
Monocytes Relative: 9.3 %
Neutro Abs: 5208 cells/uL (ref 1500–7800)
Neutrophils Relative %: 49.6 %
Platelets: 331 10*3/uL (ref 140–400)
RBC: 4.3 10*6/uL (ref 4.20–5.80)
RDW: 12.6 % (ref 11.0–15.0)
Total Lymphocyte: 35.1 %
WBC: 10.5 10*3/uL (ref 3.8–10.8)

## 2021-12-14 LAB — COMPLETE METABOLIC PANEL WITH GFR
AG Ratio: 1.6 (calc) (ref 1.0–2.5)
ALT: 9 U/L (ref 9–46)
AST: 16 U/L (ref 10–35)
Albumin: 4.6 g/dL (ref 3.6–5.1)
Alkaline phosphatase (APISO): 64 U/L (ref 35–144)
BUN: 11 mg/dL (ref 7–25)
CO2: 25 mmol/L (ref 20–32)
Calcium: 9.5 mg/dL (ref 8.6–10.3)
Chloride: 105 mmol/L (ref 98–110)
Creat: 0.78 mg/dL (ref 0.70–1.35)
Globulin: 2.8 g/dL (calc) (ref 1.9–3.7)
Glucose, Bld: 88 mg/dL (ref 65–99)
Potassium: 4.6 mmol/L (ref 3.5–5.3)
Sodium: 139 mmol/L (ref 135–146)
Total Bilirubin: 0.3 mg/dL (ref 0.2–1.2)
Total Protein: 7.4 g/dL (ref 6.1–8.1)
eGFR: 102 mL/min/{1.73_m2} (ref >=60)

## 2021-12-14 NOTE — Progress Notes (Signed)
CMP WNL

## 2021-12-19 ENCOUNTER — Telehealth: Payer: Self-pay | Admitting: Neurology

## 2021-12-19 NOTE — Telephone Encounter (Signed)
Called pateint no answer and left voicemail to please call me back and let me know before the slot is taken.

## 2021-12-19 NOTE — Telephone Encounter (Signed)
Patient sent a message to get in sooner with Delice Lesch due to his keppra medication not working. I have left him a VM to call us back to see if we can get him in sooner than his Oct appt that he has schedule.  I wanted to send a message back about the medication mot working incase Aquino wanted to try something different or change something before she saw him

## 2021-12-19 NOTE — Telephone Encounter (Signed)
Patient has appointment scheduled.

## 2021-12-19 NOTE — Telephone Encounter (Signed)
We had talked about switching to a different medication on his last visit. I have an opening on Friday at 3:30pm if they can come to discuss, thanks

## 2021-12-21 DIAGNOSIS — L299 Pruritus, unspecified: Secondary | ICD-10-CM | POA: Diagnosis not present

## 2021-12-21 DIAGNOSIS — B353 Tinea pedis: Secondary | ICD-10-CM | POA: Diagnosis not present

## 2021-12-21 DIAGNOSIS — B351 Tinea unguium: Secondary | ICD-10-CM | POA: Diagnosis not present

## 2021-12-22 ENCOUNTER — Encounter: Payer: Self-pay | Admitting: Neurology

## 2021-12-22 ENCOUNTER — Ambulatory Visit: Payer: Medicare HMO | Admitting: Neurology

## 2021-12-22 DIAGNOSIS — G40009 Localization-related (focal) (partial) idiopathic epilepsy and epileptic syndromes with seizures of localized onset, not intractable, without status epilepticus: Secondary | ICD-10-CM | POA: Diagnosis not present

## 2021-12-22 MED ORDER — OXCARBAZEPINE 300 MG PO TABS: ORAL_TABLET | ORAL | 6 refills | Status: DC
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
## 2021-12-22 NOTE — Patient Instructions (Addendum)
Start oxcarbazepine (Trileptal) 376m: take 1 tablet twice a day for 1 week, then increase to 2 tablets twice a day  2. Conitnue Keppra 10020m take 1 tablet twice a day for another 2 weeks, then reduce to 1/2 tablet twice a day for 1 week, then reduce to 1/2 tablet every night for 1 week, then stop Keppra  3. Continue seizure calendar  4. Follow-up in 2-3 months, call for any changes   Seizure Precautions: 1. If medication has been prescribed for you to prevent seizures, take it exactly as directed.  Do not stop taking the medicine without talking to your doctor first, even if you have not had a seizure in a long time.   2. Avoid activities in which a seizure would cause danger to yourself or to others.  Don't operate dangerous machinery, swim alone, or climb in high or dangerous places, such as on ladders, roofs, or girders.  Do not drive unless your doctor says you may.  3. If you have any warning that you may have a seizure, lay down in a safe place where you can't hurt yourself.    4.  No driving for 6 months from last seizure, [facility]  Please refer to the following link on the [facility] for more information: http://[email or web address]/[email or web address]/drivingu.cfm   5.  Maintain good sleep hygiene. Avoid alcohol  6.  Contact your doctor if you have any problems that may be related to the medicine you are taking.  7.  Call 911 and bring the patient back to the ED if:        A.  The seizure lasts longer than 5 minutes.       B.  The patient doesn't awaken shortly after the seizure  C.  The patient has new problems such as difficulty seeing, speaking or moving  D.  The patient was injured during the seizure  E.  The patient has a temperature over 102 F (39C)  F.  The patient vomited and now is having trouble breathing

## 2021-12-26 ENCOUNTER — Telehealth (HOSPITAL_BASED_OUTPATIENT_CLINIC_OR_DEPARTMENT_OTHER): Payer: Self-pay | Admitting: Physical Therapy

## 2021-12-26 NOTE — Telephone Encounter (Signed)
I left the patient a voicemail requesting that he call back to discuss safety in aquatic therapy and schedule PT appointments.  Lani Mendiola C. Camreigh Michie PT, DPT 12/26/21 12:16 PM

## 2021-12-28 ENCOUNTER — Other Ambulatory Visit: Payer: Self-pay | Admitting: Rheumatology

## 2021-12-28 DIAGNOSIS — M0609 Rheumatoid arthritis without rheumatoid factor, multiple sites: Secondary | ICD-10-CM

## 2021-12-28 DIAGNOSIS — Z79899 Other long term (current) drug therapy: Secondary | ICD-10-CM

## 2021-12-28 DIAGNOSIS — Z8719 Personal history of other diseases of the digestive system: Secondary | ICD-10-CM

## 2022-01-15 ENCOUNTER — Other Ambulatory Visit: Payer: Self-pay | Admitting: Family Medicine

## 2022-01-15 ENCOUNTER — Encounter: Payer: Self-pay | Admitting: Gastroenterology

## 2022-01-15 ENCOUNTER — Encounter: Payer: Self-pay | Admitting: Family Medicine

## 2022-01-15 MED ORDER — CYCLOBENZAPRINE HCL 5 MG PO TABS
5.0000 mg | ORAL_TABLET | Freq: Three times a day (TID) | ORAL | 1 refills | Status: DC | PRN
Start: 2022-01-15 — End: 2022-03-02

## 2022-01-16 DIAGNOSIS — M25662 Stiffness of left knee, not elsewhere classified: Secondary | ICD-10-CM | POA: Diagnosis not present

## 2022-01-16 DIAGNOSIS — M6281 Muscle weakness (generalized): Secondary | ICD-10-CM | POA: Diagnosis not present

## 2022-01-16 DIAGNOSIS — R269 Unspecified abnormalities of gait and mobility: Secondary | ICD-10-CM | POA: Diagnosis not present

## 2022-01-16 DIAGNOSIS — M1712 Unilateral primary osteoarthritis, left knee: Secondary | ICD-10-CM | POA: Diagnosis not present

## 2022-01-17 ENCOUNTER — Other Ambulatory Visit: Payer: Self-pay | Admitting: *Deleted

## 2022-01-17 DIAGNOSIS — Z79899 Other long term (current) drug therapy: Secondary | ICD-10-CM

## 2022-01-17 DIAGNOSIS — Z8719 Personal history of other diseases of the digestive system: Secondary | ICD-10-CM

## 2022-01-17 DIAGNOSIS — M0609 Rheumatoid arthritis without rheumatoid factor, multiple sites: Secondary | ICD-10-CM

## 2022-01-17 MED ORDER — HUMIRA (2 PEN) 40 MG/0.4ML ~~LOC~~ AJKT: 40.0000 mg | AUTO-INJECTOR | SUBCUTANEOUS | 0 refills | Status: DC

## 2022-01-17 NOTE — Telephone Encounter (Signed)
Refill request received via fax from [facility] for Humira  Next Visit: 02/12/2022  Last Visit: 12/13/2021  Last Fill: 11/01/2021  DX: Rheumatoid arthritis of multiple sites with negative rheumatoid factor   Current Dose per office note 12/13/2021: Humira 40 mg sq injections every 14 days.  Labs: 12/13/2021 Absolute monocytes and eosinophils are borderline elevated. Rest of CBC WNL.  CMP WNL  TB Gold: 09/15/2021 Neg    Okay to refill Humira?

## 2022-01-18 DIAGNOSIS — L299 Pruritus, unspecified: Secondary | ICD-10-CM | POA: Diagnosis not present

## 2022-01-18 DIAGNOSIS — Z79899 Other long term (current) drug therapy: Secondary | ICD-10-CM | POA: Diagnosis not present

## 2022-01-18 DIAGNOSIS — B351 Tinea unguium: Secondary | ICD-10-CM | POA: Diagnosis not present

## 2022-01-24 DIAGNOSIS — M1712 Unilateral primary osteoarthritis, left knee: Secondary | ICD-10-CM | POA: Diagnosis not present

## 2022-01-24 DIAGNOSIS — R269 Unspecified abnormalities of gait and mobility: Secondary | ICD-10-CM | POA: Diagnosis not present

## 2022-01-24 DIAGNOSIS — M25662 Stiffness of left knee, not elsewhere classified: Secondary | ICD-10-CM | POA: Diagnosis not present

## 2022-01-24 DIAGNOSIS — M6281 Muscle weakness (generalized): Secondary | ICD-10-CM | POA: Diagnosis not present

## 2022-01-25 DIAGNOSIS — R269 Unspecified abnormalities of gait and mobility: Secondary | ICD-10-CM | POA: Diagnosis not present

## 2022-01-25 DIAGNOSIS — M6281 Muscle weakness (generalized): Secondary | ICD-10-CM | POA: Diagnosis not present

## 2022-01-25 DIAGNOSIS — M1712 Unilateral primary osteoarthritis, left knee: Secondary | ICD-10-CM | POA: Diagnosis not present

## 2022-01-25 DIAGNOSIS — M25662 Stiffness of left knee, not elsewhere classified: Secondary | ICD-10-CM | POA: Diagnosis not present

## 2022-01-29 DIAGNOSIS — R269 Unspecified abnormalities of gait and mobility: Secondary | ICD-10-CM | POA: Diagnosis not present

## 2022-01-29 DIAGNOSIS — M25662 Stiffness of left knee, not elsewhere classified: Secondary | ICD-10-CM | POA: Diagnosis not present

## 2022-01-29 DIAGNOSIS — M1712 Unilateral primary osteoarthritis, left knee: Secondary | ICD-10-CM | POA: Diagnosis not present

## 2022-01-29 DIAGNOSIS — M6281 Muscle weakness (generalized): Secondary | ICD-10-CM | POA: Diagnosis not present

## 2022-01-29 NOTE — Progress Notes (Signed)
Office Visit Note  Patient: Terry Guerrero             Date of Birth: 1960/12/13           MRN: [id number]             PCP: Susy Frizzle, MD Referring: Susy Frizzle, MD Visit Date: 02/12/2022 Occupation: @GUAROCC @  Subjective:  Chronic left knee joint pain   History of Present Illness: Terry Guerrero is a 61 y.o. male with history of seronegative rheumatoid arthritis, osteoarthritis, and DDD.  He is taking Sulfasalazine 1 g by mouth twice daily and Humira 40 mg sq injections every 14 days.  Patient reports that he plans on trying to taper off of sulfasalazine completely.  He has found Humira to be effective at managing his ulcerative colitis as well as joint pain and inflammation. He has not missed any doses of humira recently.  He has an upcoming appointment with his gastroenterologist on 02/14/2022 at which time he plans on further discussing discontinuing sulfasalazine.  He denies any diarrhea, constipation, or blood in his stool at this time.  He has not needed to take prednisone recently.  He denies any recent or recurrent infections. He continues to have chronic pain in the left knee joint.  He previously had an ACL reconstruction.  He had a fall about 6 months ago and has had persistent discomfort and instability.  He has been going to physical therapy twice a week and continues to use crutches to assist with ambulation.  He is taking hydrocodone for pain relief as well as Flexeril before physical therapy. He states that he woke up yesterday morning with increased pain in the right elbow joint. It is painful for him to bend his right elbow.  He denies any known injury.   Patient reports he was given a medication to help with his nail fungal infection.  He states that he developed a rash after initiating the medication.  He continues to have itching and redness on bilateral upper arms.  He has been using Vicks topically but the rash has persisted over the past 2 weeks.       Activities of Daily Living:  Patient reports morning stiffness for 0 minutes  Patient Denies nocturnal pain.  Difficulty dressing/grooming: Denies Difficulty climbing stairs: Reports Difficulty getting out of chair: Reports Difficulty using hands for taps, buttons, cutlery, and/or writing: Denies  Review of Systems  Constitutional:  Negative for fatigue and night sweats.  HENT:  Positive for mouth dryness. Negative for mouth sores and nose dryness.   Eyes:  Negative for redness and dryness.  Respiratory:  Negative for cough, hemoptysis, shortness of breath and difficulty breathing.   Cardiovascular:  Negative for chest pain, palpitations, hypertension, irregular heartbeat and swelling in legs/feet.  Gastrointestinal:  Negative for blood in stool, constipation and diarrhea.  Endocrine: Negative for increased urination.  Genitourinary:  Negative for painful urination.  Musculoskeletal:  Positive for joint pain and joint pain. Negative for joint swelling, myalgias, muscle weakness, morning stiffness, muscle tenderness and myalgias.  Skin:  Positive for rash. Negative for color change, hair loss, nodules/bumps, skin tightness, ulcers and sensitivity to sunlight.  Allergic/Immunologic: Negative for susceptible to infections.  Neurological:  Negative for dizziness, fainting, memory loss, night sweats and weakness.  Hematological:  Negative for swollen glands.  Psychiatric/Behavioral:  Negative for depressed mood and sleep disturbance. The patient is not nervous/anxious.     PMFS History:  Patient Active Problem List  Diagnosis Date Noted   Syncope and collapse 04/12/2021   Rectal bleeding 05/07/2019   Strain of left pectoralis muscle 02/13/2017   Rheumatoid arthritis ([place])     Past Medical History:  Diagnosis Date   Arthritis    GERD (gastroesophageal reflux disease)    Rheumatoid arthritis(714.0)    Seizures ([place]) 12/2020   Ulcerative colitis ([place])     Family History   Problem Relation Age of Onset   Alcoholism Mother    Colon cancer Neg Hx    Esophageal cancer Neg Hx    Stomach cancer Neg Hx    Rectal cancer Neg Hx    Past Surgical History:  Procedure Laterality Date   ANTERIOR CRUCIATE LIGAMENT REPAIR     Left knee   COLONOSCOPY     HERNIA REPAIR  2020   Social History   Social History Narrative   Daily caffeine    Right handed    Lives with girlfriend. One story home   1 son.     There is no immunization history on file for this patient.   Objective: Vital Signs: BP 132/83 (BP Location: Left Arm, Patient Position: Sitting, Cuff Size: Normal)   Pulse 80   Ht 5' 6"  (1.676 m)   Wt 140 lb (63.5 kg) Comment: per patient  BMI 22.60 kg/m    Physical Exam Vitals and nursing note reviewed.  Constitutional:      Appearance: He is well-developed.  HENT:     Head: Normocephalic and atraumatic.  Eyes:     Conjunctiva/sclera: Conjunctivae normal.     Pupils: Pupils are equal, round, and reactive to light.  Cardiovascular:     Rate and Rhythm: Normal rate and regular rhythm.     Heart sounds: Normal heart sounds.  Pulmonary:     Effort: Pulmonary effort is normal.     Breath sounds: Normal breath sounds.  Abdominal:     General: Bowel sounds are normal.     Palpations: Abdomen is soft.  Musculoskeletal:     Cervical back: Normal range of motion and neck supple.  Skin:    General: Skin is warm and dry.     Capillary Refill: Capillary refill takes less than 2 seconds.     Comments: Erythematous macules on both arms with skin peeling noted.  Neurological:     Mental Status: He is alert and oriented to person, place, and time.  Psychiatric:        Behavior: Behavior normal.      Musculoskeletal Exam: C-spine has good range of motion with no discomfort.  Right sternoclavicular joint thickening. Tenderness over the right elbow.  No olecranon bursitis noted.  Limited ROM of both wrist joints.  Synovial thickening of both wrists,  especially on the lateral aspect.  PIP and DIP thickening consistent with osteoarthritis of both hands.  Incomplete fist formation bilaterally.  Hip joint have good ROM with no groin pain.  Left knee has painful ROM and limitation with extension.  Right knee has good ROM with no warmth or effusion.  Ankle joints have good ROM with no tenderness or joint swelling.   CDAI Exam: CDAI Score: 2.4  Patient Global: 2 mm; Provider Global: 2 mm Swollen: 0 ; Tender: 2  Joint Exam 02/12/2022      Right  Left  Elbow   Tender     Knee      Tender     Investigation: No additional findings.  Imaging: No results found.  Recent Labs:  Lab Results  Component Value Date   WBC 10.5 12/13/2021   HGB 13.5 12/13/2021   PLT 331 12/13/2021   NA 139 12/13/2021   K 4.6 12/13/2021   CL 105 12/13/2021   CO2 25 12/13/2021   GLUCOSE 88 12/13/2021   BUN 11 12/13/2021   CREATININE 0.78 12/13/2021   BILITOT 0.3 12/13/2021   ALKPHOS 55 01/21/2021   AST 16 12/13/2021   ALT 9 12/13/2021   PROT 7.4 12/13/2021   ALBUMIN 3.8 01/21/2021   CALCIUM 9.5 12/13/2021   GFRAA 94 05/04/2019   QFTBGOLDPLUS NEGATIVE 09/15/2021    Speciality Comments: Enbrel by Dr. Rockwell Alexandria (discontinued due to loss of insurance), Humira started 11/01/21  Procedures:  No procedures performed Allergies: Gold-containing drug products and Naprosyn [naproxen]   Assessment / Plan:     Visit Diagnoses: Rheumatoid arthritis of multiple sites with negative rheumatoid factor ([place]) - RF negative, anti-CCP negative, elevated sedimentation rate, severe erosive RA: dxd in his 62s. Ttd at [facility] later by Dr. Rockwell Alexandria with Enbrel: Patient presents today with increased pain in the right elbow especially with range of motion.  His symptoms started yesterday with no identifiable trigger.  No olecranon bursitis was noted.  He has tenderness and warmth along the right elbow joint line.  He has not been experiencing any other signs or symptoms of a rheumatoid  arthritis flare.  Overall he has been doing well on Humira 40 mg subcu as injections every 14 days.  He continues to take sulfasalazine 500 mg 2 tablets twice daily but has been trying to slowly taper off of sulfasalazine since his symptoms have been well controlled on Humira.  He has an upcoming appointment with his gastroenterologist on 02/14/2022 at which time he plans on further discussing discontinuing sulfasalazine altogether.  He does not want a refill at this time.  He will remain on Humira as prescribed.  He was advised to notify us if he develops increased joint pain or joint swelling.  He will follow-up in the office in 3 months or sooner if needed.  High risk medication use - Humira 40 mg sq injections every 14 days.  Patient has slowly been titrating the dose of sulfasalazine and is currently taking 500 mg 1 tablet twice daily.  He has an upcoming appointment with his gastrologist on 02/14/2022 at which point he plans on discussing discontinuing sulfasalazine altogether.  He would like to remain on Humira as monotherapy. Discussed the importance of holding Humira if he develops signs or symptoms of an infection and to resume once the infection has completely cleared. CBC and CMP were drawn on 12/13/2021. His next lab work will be due in November and every 3 months.  TB gold negative on 09/15/21.  - Plan: COMPLETE METABOLIC PANEL WITH GFR, CBC with Differential/Platelet  Pain in both hands - Clinical and radiographic findings were consistent with osteoarthritis and rheumatoid arthritis.  No active inflammation was noted on examination today.  Effusion, left knee - He is under the care of orthopedics at [facility].   Primary osteoarthritis of left knee - MRI August 18, 2021 showed moderate OA and meniscal tear.  History of ACL reconstruction.  Recent fall.  Using crutches to assist with ambulation.  Currently going to physical therapy twice a week.  Pain in both feet - X-rays were  consistent with osteoarthritis and rheumatoid arthritis overlap.  No active inflammation.  Wearing proper fitting shoes.  Pain of right sternoclavicular joint: Thickening but no discomfort or tenderness  at this time.  DDD (degenerative disc disease), lumbar: He is not experiencing any increased discomfort in his lower back at this time.  Temporal lobe epilepsy [facility]) - New onset seizures started October 2022.  He is on Keppra.  Followed by Dr. Delice Lesch.  History of ulcerative colitis -  He has been followed by Dr. Fuller Plan.  Upcoming appointment with gastroenterology seen 02/14/2022.  He has not had any signs or symptoms of an ulcerative colitis flare.  He remains on Humira as prescribed.  He has started to slowly titrate off of sulfasalazine and is currently taking 500 mg twice daily.  He has not been experiencing any constipation, diarrhea, or blood in his stool.  He has not needed to take prednisone recently.  Rash and other nonspecific skin eruption: Drug reaction.  Maculopapular rash with skin peeling.  Patient reports that the rash started after taking an antifungal medication prescribed by dermatology.  The rash has gradually started to improve over the past 2 weeks but states that initially his arms were bright red and swollen then leading to blisters and skin peeling.  He did not follow back up with his dermatologist but discontinued the anti-fungal medication.  A prednisone taper stating at 20 mg tapering by 5 mg every 2 days was sent to the pharmacy.  Instructions were provided.  Other medical conditions are listed as follows:   Syncope and collapse - Cardiology work-up was negative.  Smoker - Half pack per day for 30 years.  Orders: Orders Placed This Encounter  Procedures   COMPLETE METABOLIC PANEL WITH GFR   CBC with Differential/Platelet   Meds ordered this encounter  Medications   predniSONE (DELTASONE) 5 MG tablet    Sig: Take 4 tablets by mouth daily x2 day, 3 tablets daily x2  days, 2 tablets daily x2 days, 1 tablet daily x2 days.    Dispense:  20 tablet    Refill:  0     Follow-Up Instructions: Return in about 3 months (around 05/15/2022) for Rheumatoid arthritis, Osteoarthritis, DDD.   Ofilia Neas, PA-C  Note - This record has been created using Dragon software.  Chart creation errors have been sought, but may not always  have been located. Such creation errors do not reflect on  the standard of medical care.

## 2022-01-31 DIAGNOSIS — M25662 Stiffness of left knee, not elsewhere classified: Secondary | ICD-10-CM | POA: Diagnosis not present

## 2022-01-31 DIAGNOSIS — M1712 Unilateral primary osteoarthritis, left knee: Secondary | ICD-10-CM | POA: Diagnosis not present

## 2022-01-31 DIAGNOSIS — R269 Unspecified abnormalities of gait and mobility: Secondary | ICD-10-CM | POA: Diagnosis not present

## 2022-01-31 DIAGNOSIS — M6281 Muscle weakness (generalized): Secondary | ICD-10-CM | POA: Diagnosis not present

## 2022-02-08 ENCOUNTER — Ambulatory Visit: Payer: Medicare HMO | Admitting: Neurology

## 2022-02-12 ENCOUNTER — Encounter: Payer: Self-pay | Admitting: Physician Assistant

## 2022-02-12 ENCOUNTER — Ambulatory Visit: Payer: Medicare HMO | Attending: Physician Assistant | Admitting: Physician Assistant

## 2022-02-12 VITALS — BP 132/83 | HR 80 | Ht 66.0 in | Wt 140.0 lb

## 2022-02-12 DIAGNOSIS — M1712 Unilateral primary osteoarthritis, left knee: Secondary | ICD-10-CM

## 2022-02-12 DIAGNOSIS — M5136 Other intervertebral disc degeneration, lumbar region: Secondary | ICD-10-CM

## 2022-02-12 DIAGNOSIS — R21 Rash and other nonspecific skin eruption: Secondary | ICD-10-CM

## 2022-02-12 DIAGNOSIS — M79671 Pain in right foot: Secondary | ICD-10-CM | POA: Diagnosis not present

## 2022-02-12 DIAGNOSIS — F17200 Nicotine dependence, unspecified, uncomplicated: Secondary | ICD-10-CM | POA: Diagnosis not present

## 2022-02-12 DIAGNOSIS — G40109 Localization-related (focal) (partial) symptomatic epilepsy and epileptic syndromes with simple partial seizures, not intractable, without status epilepticus: Secondary | ICD-10-CM

## 2022-02-12 DIAGNOSIS — M0609 Rheumatoid arthritis without rheumatoid factor, multiple sites: Secondary | ICD-10-CM | POA: Diagnosis not present

## 2022-02-12 DIAGNOSIS — R269 Unspecified abnormalities of gait and mobility: Secondary | ICD-10-CM | POA: Diagnosis not present

## 2022-02-12 DIAGNOSIS — M25662 Stiffness of left knee, not elsewhere classified: Secondary | ICD-10-CM | POA: Diagnosis not present

## 2022-02-12 DIAGNOSIS — Z79899 Other long term (current) drug therapy: Secondary | ICD-10-CM | POA: Diagnosis not present

## 2022-02-12 DIAGNOSIS — M6281 Muscle weakness (generalized): Secondary | ICD-10-CM | POA: Diagnosis not present

## 2022-02-12 DIAGNOSIS — M79642 Pain in left hand: Secondary | ICD-10-CM

## 2022-02-12 DIAGNOSIS — M25511 Pain in right shoulder: Secondary | ICD-10-CM | POA: Diagnosis not present

## 2022-02-12 DIAGNOSIS — Z8719 Personal history of other diseases of the digestive system: Secondary | ICD-10-CM

## 2022-02-12 DIAGNOSIS — M79672 Pain in left foot: Secondary | ICD-10-CM

## 2022-02-12 DIAGNOSIS — R55 Syncope and collapse: Secondary | ICD-10-CM

## 2022-02-12 DIAGNOSIS — M25462 Effusion, left knee: Secondary | ICD-10-CM | POA: Diagnosis not present

## 2022-02-12 DIAGNOSIS — M79641 Pain in right hand: Secondary | ICD-10-CM

## 2022-02-12 MED ORDER — PREDNISONE 5 MG PO TABS: ORAL_TABLET | ORAL | 0 refills | Status: DC

## 2022-02-12 NOTE — Patient Instructions (Addendum)
Standing Labs We placed an order today for your standing lab work.   Please have your standing labs drawn in January and every 3 months    Please have your labs drawn 2 weeks prior to your appointment so that the provider can discuss your lab results at your appointment.  Please note that you may see your imaging and lab results in [facility] before we have reviewed them. We will contact you once all results are reviewed. Please allow our office up to 72 hours to thoroughly review all of the results before contacting the office for clarification of your results.  Lab hours are: Monday through Thursday from 1:30 pm-4:30 pm and Friday from 1:30 pm- 4:00 pm  You may experience shorter wait times on Monday, Thursday or Friday afternoons,.   Effective February 19, 2022, new lab hours will be: Monday through Thursday from 8:00 am -12:30 pm and 1:00 pm-5:00 pm and Friday from 8:00 am-12:00 pm.  Please be advised, all patients with office appointments requiring lab work will take precedent over walk-in lab work.   Labs are drawn by [facility]. Please bring your co-pay at the time of your lab draw.  You may receive a bill from [facility] for your lab work.  Please note if you are on Hydroxychloroquine and and an order has been placed for a Hydroxychloroquine level, you will need to have it drawn 4 hours or more after your last dose.  If you wish to have your labs drawn at another location, please call the office 24 hours in advance so we can fax the orders.  The office is located at [address], [place], [place], [place] [address] No appointment is necessary.    If you have any questions regarding directions or hours of operation,  please call [phone].   As a reminder, please drink plenty of water prior to coming for your lab work. Thanks!\ If you have signs or symptoms of an infection or start antibiotics: First, call your PCP for workup of your infection. Hold your medication through the  infection, until you complete your antibiotics, and until symptoms resolve if you take the following: Injectable medication (Actemra, Benlysta, Cimzia, Cosentyx, Enbrel, Humira, Kevzara, Orencia, Remicade, Simponi, Stelara, Taltz, Tremfya) Methotrexate Leflunomide (Arava) Mycophenolate (Cellcept) Morrie Sheldon, Olumiant, or Rinvoq   Vaccines You are taking a medication(s) that can suppress your immune system.  The following immunizations are recommended: Flu annually Covid-19  Td/Tdap (tetanus, diphtheria, pertussis) every 10 years Pneumonia (Prevnar 15 then Pneumovax 23 at least 1 year apart.  Alternatively, can take Prevnar 20 without needing additional dose) Shingrix: 2 doses from 4 weeks to 6 months apart  Please check with your PCP to make sure you are up to date.

## 2022-02-13 LAB — COMPLETE METABOLIC PANEL WITH GFR
AG Ratio: 1.8 (calc) (ref 1.0–2.5)
ALT: 19 U/L (ref 9–46)
AST: 21 U/L (ref 10–35)
Albumin: 4.5 g/dL (ref 3.6–5.1)
Alkaline phosphatase (APISO): 67 U/L (ref 35–144)
BUN: 11 mg/dL (ref 7–25)
CO2: 26 mmol/L (ref 20–32)
Calcium: 9.4 mg/dL (ref 8.6–10.3)
Chloride: 105 mmol/L (ref 98–110)
Creat: 0.75 mg/dL (ref 0.70–1.35)
Globulin: 2.5 g/dL (calc) (ref 1.9–3.7)
Glucose, Bld: 99 mg/dL (ref 65–99)
Potassium: 4.4 mmol/L (ref 3.5–5.3)
Sodium: 139 mmol/L (ref 135–146)
Total Bilirubin: 0.4 mg/dL (ref 0.2–1.2)
Total Protein: 7 g/dL (ref 6.1–8.1)
eGFR: 103 mL/min/{1.73_m2} (ref >=60)

## 2022-02-13 LAB — CBC WITH DIFFERENTIAL/PLATELET
Absolute Monocytes: 1088 cells/uL — ABNORMAL HIGH (ref 200–950)
Basophils Absolute: 96 cells/uL (ref 0–200)
Basophils Relative: 1.1 %
Eosinophils Absolute: 0 cells/uL — ABNORMAL LOW (ref 15–500)
Eosinophils Relative: 0 %
HCT: 38.1 % — ABNORMAL LOW (ref 38.5–50.0)
Hemoglobin: 13 g/dL — ABNORMAL LOW (ref 13.2–17.1)
Lymphs Abs: 2706 cells/uL (ref 850–3900)
MCH: 32.9 pg (ref 27.0–33.0)
MCHC: 34.1 g/dL (ref 32.0–36.0)
MCV: 96.5 fL (ref 80.0–100.0)
MPV: 9.4 fL (ref 7.5–12.5)
Monocytes Relative: 12.5 %
Neutro Abs: 4811 cells/uL (ref 1500–7800)
Neutrophils Relative %: 55.3 %
Platelets: 380 10*3/uL (ref 140–400)
RBC: 3.95 10*6/uL — ABNORMAL LOW (ref 4.20–5.80)
RDW: 12.4 % (ref 11.0–15.0)
Total Lymphocyte: 31.1 %
WBC: 8.7 10*3/uL (ref 3.8–10.8)

## 2022-02-13 NOTE — Progress Notes (Signed)
CMP WNL.  Patient is mildly anemic. RBC count, hgb, and hct are borderline low.

## 2022-02-14 ENCOUNTER — Encounter: Payer: Self-pay | Admitting: Gastroenterology

## 2022-02-14 ENCOUNTER — Ambulatory Visit: Payer: Medicare HMO | Admitting: Gastroenterology

## 2022-02-14 VITALS — BP 134/72 | HR 75 | Ht 66.0 in | Wt 142.0 lb

## 2022-02-14 DIAGNOSIS — K5100 Ulcerative (chronic) pancolitis without complications: Secondary | ICD-10-CM | POA: Diagnosis not present

## 2022-02-14 NOTE — Progress Notes (Signed)
02/14/2022 BEDFORD WINSOR [id number] [birth year]   HISTORY OF PRESENT ILLNESS:  This is a 61 year old male who is a patient of Dr. Lynne Leader.  He has ulcerative pancolitis diagnosed in January 2021.  Also has RA.  He had been maintained on sulfasalazine and folic acid as there were no other mesalamine/5-ASA's covered by his insurance.  He was last seen here in January 2023 and was having small amounts of bright red blood per rectum.  About 3 months ago he started Humira as prescribed by his rheumatologist for his RA.  He says that since beginning that he has had no further rectal bleeding.  Has 1 or 2 formed bowel movements daily.  No abdominal pain.  He would like to see about coming off of the sulfasalazine and folic acid.  Recent CBC and CMP 2 days ago looked good.  Past Medical History:  Diagnosis Date   Arthritis    GERD (gastroesophageal reflux disease)    Rheumatoid arthritis(714.0)    Seizures ([place]) 12/2020   Ulcerative colitis ([place])    Past Surgical History:  Procedure Laterality Date   ANTERIOR CRUCIATE LIGAMENT REPAIR     Left knee   COLONOSCOPY     HERNIA REPAIR  2119   umbilical    reports that he has been smoking cigarettes. He has a 25.00 pack-year smoking history. He has never used smokeless tobacco. He reports that he does not currently use alcohol. He reports current drug use. Drugs: Hydrocodone and Marijuana. family history includes Alcoholism in his mother. Allergies  Allergen Reactions   Gold-Containing Drug Products    Lamisil [Terbinafine] Other (See Comments)    The generic made his skin flaky and red itchy rash   Naprosyn [Naproxen]       Outpatient Encounter Medications as of 02/14/2022  Medication Sig   Adalimumab (HUMIRA PEN) 40 MG/0.4ML PNKT Inject 40 mg into the skin every 14 (fourteen) days.   cyclobenzaprine (FLEXERIL) 5 MG tablet Take 1 tablet (5 mg total) by mouth 3 (three) times daily as needed for muscle spasms.   folic acid (FOLVITE) 1 MG  tablet Take 1 tablet (1 mg total) by mouth daily.   Oxcarbazepine (TRILEPTAL) 300 MG tablet Take 1 tablet twice a day for 1 week, then increase to 2 tablets twice a day   predniSONE (DELTASONE) 5 MG tablet Take 4 tablets by mouth daily x2 day, 3 tablets daily x2 days, 2 tablets daily x2 days, 1 tablet daily x2 days.   sulfaSALAzine (AZULFIDINE) 500 MG tablet TAKE 2 TABLETS BY MOUTH TWICE DAILY   [DISCONTINUED] HYDROcodone-acetaminophen (NORCO) 7.5-325 MG tablet Take 1 tablet by mouth every 6 (six) hours as needed for moderate pain.   [DISCONTINUED] levETIRAcetam (KEPPRA) 1000 MG tablet Take 1 tablet (1,000 mg total) by mouth 2 (two) times daily.   No facility-administered encounter medications on file as of 02/14/2022.     REVIEW OF SYSTEMS  : All other systems reviewed and negative except where noted in the History of Present Illness.   PHYSICAL EXAM: BP 134/72   Pulse 75   Ht 5' 6"  (1.676 m)   Wt 142 lb (64.4 kg)   BMI 22.92 kg/m  General: Well developed white male in no acute distress Head: Normocephalic and atraumatic Eyes:  Sclerae anicteric, conjunctiva pink. Ears: Normal auditory acuity Lungs: Clear throughout to auscultation; no W/R/R. Heart: Regular rate and rhythm; no M/R/G. Abdomen: Soft, non-distended.  BS present.  Non-tender. Musculoskeletal: Symmetrical with  no gross deformities  Skin: No lesions on visible extremities Extremities: No edema  Neurological: Alert oriented x 4, grossly non-focal Psychological:  Alert and cooperative. Normal mood and affect  ASSESSMENT AND PLAN: Ulcerative pancolitis maintained previously on sulfasalazine 1 g p.o. twice daily with folic acid.  Started Humira 3 months ago by rheumatology for his RA and it has been controlling his UC well.  Would like to see about coming off of his sulfasalazine.  I think that it is fine to stop that and the folic acid and see how he does.  He will let us know if he has an recurrence of UC symptoms, but  otherwise will follow--up in 6 months.  Surveillance colonoscopy recommended in January 2027. Personal history of 2 small adenomatous colon polyps.  Surveillance colonoscopy as above.   CC:  Susy Frizzle, MD

## 2022-02-14 NOTE — Patient Instructions (Signed)
Follow up in 6 months 

## 2022-03-02 ENCOUNTER — Ambulatory Visit: Payer: Medicare HMO | Admitting: Neurology

## 2022-03-02 ENCOUNTER — Encounter: Payer: Self-pay | Admitting: Neurology

## 2022-03-02 VITALS — BP 126/77 | HR 82 | Ht 66.0 in | Wt 141.4 lb

## 2022-03-02 DIAGNOSIS — G40009 Localization-related (focal) (partial) idiopathic epilepsy and epileptic syndromes with seizures of localized onset, not intractable, without status epilepticus: Secondary | ICD-10-CM

## 2022-03-02 MED ORDER — OXCARBAZEPINE 300 MG PO TABS: ORAL_TABLET | ORAL | 11 refills | Status: DC

## 2022-03-02 NOTE — Patient Instructions (Signed)
Good to see you doing better. Continue Oxcarbazepine 329m: take 2 tablets twice a day. Follow-up in 4 months, call for any changes.   Seizure Precautions: 1. If medication has been prescribed for you to prevent seizures, take it exactly as directed.  Do not stop taking the medicine without talking to your doctor first, even if you have not had a seizure in a long time.   2. Avoid activities in which a seizure would cause danger to yourself or to others.  Don't operate dangerous machinery, swim alone, or climb in high or dangerous places, such as on ladders, roofs, or girders.  Do not drive unless your doctor says you may.  3. If you have any warning that you may have a seizure, lay down in a safe place where you can't hurt yourself.    4.  No driving for 6 months from last seizure, as [facility]   Please refer to the following link on the EJenningswebsite for more information: http://[email or web address]/[email or web address]/[email or web address]   5.  Maintain good sleep hygiene. Avoid alcohol.  6.  Contact your doctor if you have any problems that may be related to the medicine you are taking.  7.  Call 911 and bring the patient back to the ED if:        A.  The seizure lasts longer than 5 minutes.       B.  The patient doesn't awaken shortly after the seizure  C.  The patient has new problems such as difficulty seeing, speaking or moving  D.  The patient was injured during the seizure  E.  The patient has a temperature over 102 F (39C)  F.  The patient vomited and now is having trouble breathing
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
## 2022-03-07 ENCOUNTER — Ambulatory Visit: Payer: Medicare HMO | Admitting: Rheumatology

## 2022-03-08 ENCOUNTER — Telehealth: Payer: Self-pay | Admitting: *Deleted

## 2022-03-08 DIAGNOSIS — M1712 Unilateral primary osteoarthritis, left knee: Secondary | ICD-10-CM | POA: Diagnosis not present

## 2022-03-08 NOTE — Telephone Encounter (Signed)
Patient contacted the office and states he has been seeing an Orthopedic Surgeon regarding his leg. Patient states they have discussed surgery and he will meed a surgery clearance form completed and faxed back to [employer]. Patient advised we will complete and fax once received.

## 2022-03-22 DIAGNOSIS — M1990 Unspecified osteoarthritis, unspecified site: Secondary | ICD-10-CM | POA: Diagnosis not present

## 2022-03-22 DIAGNOSIS — K5190 Ulcerative colitis, unspecified, without complications: Secondary | ICD-10-CM | POA: Diagnosis not present

## 2022-03-22 DIAGNOSIS — Z79620 Long term (current) use of immunosuppressive biologic: Secondary | ICD-10-CM | POA: Diagnosis not present

## 2022-03-22 DIAGNOSIS — M069 Rheumatoid arthritis, unspecified: Secondary | ICD-10-CM | POA: Diagnosis not present

## 2022-03-22 DIAGNOSIS — R69 Illness, unspecified: Secondary | ICD-10-CM | POA: Diagnosis not present

## 2022-03-22 DIAGNOSIS — G40909 Epilepsy, unspecified, not intractable, without status epilepticus: Secondary | ICD-10-CM | POA: Diagnosis not present

## 2022-03-22 DIAGNOSIS — I739 Peripheral vascular disease, unspecified: Secondary | ICD-10-CM | POA: Diagnosis not present

## 2022-03-22 DIAGNOSIS — Z888 Allergy status to other drugs, medicaments and biological substances status: Secondary | ICD-10-CM | POA: Diagnosis not present

## 2022-03-29 ENCOUNTER — Ambulatory Visit: Payer: Medicare HMO | Admitting: Rheumatology

## 2022-04-03 ENCOUNTER — Telehealth: Payer: Self-pay | Admitting: Anesthesiology

## 2022-04-03 NOTE — Telephone Encounter (Signed)
Pt called an informed Do not stop seizure medication for surgery. He needs to take his medications on time, even if he cannot eat/drink before surgery, he still needs to take Trileptal pt verbalized understanding,

## 2022-04-03 NOTE — Telephone Encounter (Signed)
Do not stop seizure medication for surgery. He needs to take his medications on time, even if he cannot eat/drink before surgery, he still needs to take Trileptal. Thanks

## 2022-04-03 NOTE — Telephone Encounter (Signed)
Pt called stating he is getting ready to have surgery on January 10 th 2024 and he would like to ask Dr Delice Lesch if needs to stop taking his Trileptal during this time or is he ok to take it as prescribed. Pt requests a call back.

## 2022-04-10 ENCOUNTER — Other Ambulatory Visit: Payer: Self-pay | Admitting: *Deleted

## 2022-04-10 DIAGNOSIS — M0609 Rheumatoid arthritis without rheumatoid factor, multiple sites: Secondary | ICD-10-CM

## 2022-04-10 DIAGNOSIS — Z79899 Other long term (current) drug therapy: Secondary | ICD-10-CM

## 2022-04-10 DIAGNOSIS — Z8719 Personal history of other diseases of the digestive system: Secondary | ICD-10-CM

## 2022-04-10 MED ORDER — HUMIRA (2 PEN) 40 MG/0.4ML ~~LOC~~ AJKT: 40.0000 mg | AUTO-INJECTOR | SUBCUTANEOUS | 0 refills | Status: DC

## 2022-04-10 NOTE — Telephone Encounter (Signed)
Refill request received via fax from [facility] for Humira   Next Visit: 05/29/2022  Last Visit: 02/12/2022  Last Fill: 01/17/2022  [id number] arthritis of multiple sites with negative rheumatoid factor   Current Dose per office note 02/12/2022: Humira 40 mg sq injections every 14 days.    Labs: 02/12/2022 CMP WNL. RBC count, hgb, and hct are borderline low.   TB Gold: 09/15/2021 Neg    Okay to refill Humira?

## 2022-04-13 NOTE — Progress Notes (Signed)
Surgery orders requested via Epic inbox. °

## 2022-04-17 NOTE — Progress Notes (Addendum)
Anesthesia Review:  PCP: Jenna Luo LVO 10/02/21  Cardiologist : DR Carleene Overlie taylor  LOV 07/04/21  Neuro- DR Delice Lesch- LOV 03/02/22  Chest x-ray : EKG : 04/20/22  Echo : 05/05/21  Stress test: Cardiac Cath :  Activity level: can do a flight of stairs without difficutly  Sleep Study/ CPAP : none  Fasting Blood Sugar :      / Checks Blood Sugar -- times a day:   Blood Thinner/ Instructions /Last Dose: ASA / Instructions/ Last Dose :    Hx of seizures- last one 6-8 months ago per pt  Smokes marijuana daily per pt.  Instructed pt at preop to refrain from smoking for a minimum of 24 houris prior to surgery.  PT voiced understanding.

## 2022-04-18 ENCOUNTER — Ambulatory Visit: Payer: Self-pay | Admitting: Physician Assistant

## 2022-04-18 DIAGNOSIS — M1712 Unilateral primary osteoarthritis, left knee: Secondary | ICD-10-CM | POA: Diagnosis not present

## 2022-04-18 DIAGNOSIS — G8929 Other chronic pain: Secondary | ICD-10-CM

## 2022-04-18 NOTE — H&P (View-Only) (Signed)
TOTAL KNEE ADMISSION H&P  Patient is being admitted for left total knee arthroplasty.  Subjective:  Chief Complaint:left knee pain.  HPI: Terry Guerrero, 61 y.o. male, has a history of pain and functional disability in the left knee due to arthritis and has failed non-surgical conservative treatments for greater than 12 weeks to includeNSAID's and/or analgesics, corticosteriod injections, flexibility and strengthening excercises, use of assistive devices, and activity modification.  Onset of symptoms was gradual, starting >10 years ago with gradually worsening course since that time. The patient noted prior procedures on the knee to include  menisectomy and ACL reconstruction on the left knee(s).  Patient currently rates pain in the left knee(s) at 8 out of 10 with activity. Patient has night pain, worsening of pain with activity and weight bearing, pain that interferes with activities of daily living, pain with passive range of motion, crepitus, and joint swelling.  Patient has evidence of periarticular osteophytes and joint space narrowing by imaging studies.  There is no active infection.  Patient Active Problem List   Diagnosis Date Noted   Ulcerative pancolitis without complication ([place]) 27/78/2423   Syncope and collapse 04/12/2021   Rectal bleeding 05/07/2019   Strain of left pectoralis muscle 02/13/2017   Rheumatoid arthritis ([place])    Past Medical History:  Diagnosis Date   Arthritis    GERD (gastroesophageal reflux disease)    Rheumatoid arthritis(714.0)    Seizures ([place]) 12/2020   Ulcerative colitis ([place])     Past Surgical History:  Procedure Laterality Date   ANTERIOR CRUCIATE LIGAMENT REPAIR     Left knee   COLONOSCOPY     HERNIA REPAIR  [id number]   umbilical    Current Outpatient Medications  Medication Sig Dispense Refill Last Dose   Adalimumab (HUMIRA PEN) 40 MG/0.4ML PNKT Inject 40 mg into the skin every 14 (fourteen) days. 6 each 0    Oxcarbazepine (TRILEPTAL) 300 MG  tablet Take 2 tablets twice a day 120 tablet 11    No current facility-administered medications for this visit.   Allergies  Allergen Reactions   Gold-Containing Drug Products Shortness Of Breath and Rash   Lamisil [Terbinafine] Other (See Comments)    The generic made his skin flaky and red itchy rash   Naprosyn [Naproxen]     Unknown reaction     Social History   Tobacco Use   Smoking status: Every Day    Packs/day: 1.00    Years: 25.00    Total pack years: 25.00    Types: Cigarettes   Smokeless tobacco: Never  Substance Use Topics   Alcohol use: Not Currently    Family History  Problem Relation Age of Onset   Alcoholism Mother    Colon cancer Neg Hx    Esophageal cancer Neg Hx    Stomach cancer Neg Hx    Rectal cancer Neg Hx      Review of Systems  Musculoskeletal:  Positive for arthralgias.  All other systems reviewed and are negative.   Objective:  Physical Exam Constitutional:      General: He is not in acute distress.    Appearance: Normal appearance.  HENT:     Head: Normocephalic and atraumatic.  Eyes:     Extraocular Movements: Extraocular movements intact.     Pupils: Pupils are equal, round, and reactive to light.  Cardiovascular:     Rate and Rhythm: Normal rate and regular rhythm.     Pulses: Normal pulses.     Heart sounds:  Normal heart sounds.  Pulmonary:     Effort: Pulmonary effort is normal. No respiratory distress.     Breath sounds: Normal breath sounds. No wheezing.  Abdominal:     General: Abdomen is flat. Bowel sounds are normal. There is no distension.     Palpations: Abdomen is soft.     Tenderness: There is no abdominal tenderness.  Musculoskeletal:     Cervical back: Normal range of motion and neck supple.     Comments: Left knee range of motion is 0-100 degrees.  Well healed incision from prior ACL reconstruction.  Knee feels stable to varus and valgus stress.  Negative anterior and posterior drawer.  Intact ankle dorsiflexion  and plantarflexion.  Sensation intact distally.    Lymphadenopathy:     Cervical: No cervical adenopathy.  Skin:    General: Skin is warm and dry.     Findings: No erythema or rash.  Neurological:     General: No focal deficit present.     Mental Status: He is alert and oriented to person, place, and time.  Psychiatric:        Mood and Affect: Mood normal.        Behavior: Behavior normal.     Vital signs in last 24 hours: @VSRANGES @  Labs:   Estimated body mass index is 22.82 kg/m as calculated from the following:   Height as of 03/02/22: 5' 6"  (1.676 m).   Weight as of 03/02/22: 64.1 kg.   Imaging Review Plain radiographs demonstrate moderate degenerative joint disease of the left knee(s). The overall alignment issignificant valgus. The bone quality appears to be good for age and reported activity level.      Assessment/Plan:  End stage arthritis, left knee   The patient history, physical examination, clinical judgment of the provider and imaging studies are consistent with end stage degenerative joint disease of the left knee(s) and total knee arthroplasty is deemed medically necessary. The treatment options including medical management, injection therapy arthroscopy and arthroplasty were discussed at length. The risks and benefits of total knee arthroplasty were presented and reviewed. The risks due to aseptic loosening, infection, stiffness, patella tracking problems, thromboembolic complications and other imponderables were discussed. The patient acknowledged the explanation, agreed to proceed with the plan and consent was signed. Patient is being admitted for inpatient treatment for surgery, pain control, PT, OT, prophylactic antibiotics, VTE prophylaxis, progressive ambulation and ADL's and discharge planning. The patient is planning to be discharged  home with outpt PT.     Patient's anticipated LOS is less than 2 midnights, meeting these requirements: - Younger  than 14 - Lives within 1 hour of care - Has a competent adult at home to recover with post-op recover - NO history of  - Chronic pain requiring opiods  - Diabetes  - Coronary Artery Disease  - Heart failure  - Heart attack  - Stroke  - DVT/VTE  - Cardiac arrhythmia  - Respiratory Failure/COPD  - Renal failure  - Anemia  - Advanced Liver disease

## 2022-04-18 NOTE — Patient Instructions (Addendum)
SURGICAL WAITING ROOM VISITATION  Patients having surgery or a procedure may have no more than 2 support people in the waiting area - these visitors may rotate.    Children under the age of 69 must have an adult with them who is not the patient.  Due to an increase in RSV and influenza rates and associated hospitalizations, children ages 17 and under may not visit patients in [place].  If the patient needs to stay at the hospital during part of their recovery, the visitor guidelines for inpatient rooms apply. Pre-op nurse will coordinate an appropriate time for 1 support person to accompany patient in pre-op.  This support person may not rotate.    Please refer to the [facility] website for the visitor guidelines for Inpatients (after your surgery is over and you are in a regular room).       Your procedure is scheduled on: 05/02/2022    Report to [facility] Main Entrance    Report to admitting at   0600AM   Call this number if you have problems the morning of surgery [phone]   Do not eat food :After Midnight.   After Midnight you may have the following liquids until __ 0530____ AM  DAY OF SURGERY  Water Non-Citrus Juices (without pulp, NO RED-Apple, White grape, White cranberry) Black Coffee (NO MILK/CREAM OR CREAMERS, sugar ok)  Clear Tea (NO MILK/CREAM OR CREAMERS, sugar ok) regular and decaf                             Plain Jell-O (NO RED)                                           Fruit ices (not with fruit pulp, NO RED)                                     Popsicles (NO RED)                                                               Sports drinks like Gatorade (NO RED)                    The day of surgery:  Drink ONE (1) Pre-Surgery Clear Ensure or G2 at   0530AM  ( have completed by ) the morning of surgery. Drink in one sitting. Do not sip.  This drink was given to you during your hospital  pre-op appointment visit. Nothing else to  drink after completing the  Pre-Surgery Clear Ensure or G2.          If you have questions, please contact your surgeon's office.     Oral Hygiene is also important to reduce your risk of infection.                                    Remember - BRUSH YOUR TEETH THE MORNING OF SURGERY WITH YOUR REGULAR  TOOTHPASTE  DENTURES WILL BE REMOVED PRIOR TO SURGERY PLEASE DO NOT APPLY "Poly grip" OR ADHESIVES!!!   Do NOT smoke after Midnight   Take these medicines the morning of surgery with A SIP OF WATER:  trileptal   DO NOT TAKE ANY ORAL DIABETIC MEDICATIONS DAY OF YOUR SURGERY  Bring CPAP mask and tubing day of surgery.                              You may not have any metal on your body including hair pins, jewelry, and body piercing             Do not wear make-up, lotions, powders, perfumes/cologne, or deodorant  Do not wear nail polish including gel and S&S, artificial/acrylic nails, or any other type of covering on natural nails including finger and toenails. If you have artificial nails, gel coating, etc. that needs to be removed by a nail salon please have this removed prior to surgery or surgery may need to be canceled/ delayed if the surgeon/ anesthesia feels like they are unable to be safely monitored.   Do not shave  48 hours prior to surgery.               Men may shave face and neck.   Do not bring valuables to the hospital. [place].   Contacts, glasses, dentures or bridgework may not be worn into surgery.   Bring small overnight bag day of surgery.   DO NOT [place]. PHARMACY WILL DISPENSE MEDICATIONS LISTED ON YOUR MEDICATION LIST TO YOU DURING YOUR ADMISSION [place]!    Patients discharged on the day of surgery will not be allowed to drive home.  Someone NEEDS to stay with you for the first 24 hours after anesthesia.   Special Instructions: Bring a copy of your healthcare  power of attorney and living will documents the day of surgery if you haven't scanned them before.              Please read over the following fact sheets you were given: IF [place] [phone]   If you received a COVID test during your pre-op visit  it is requested that you wear a mask when out in public, stay away from anyone that may not be feeling well and notify your surgeon if you develop symptoms. If you test positive for Covid or have been in contact with anyone that has tested positive in the last 10 days please notify you surgeon.    [place] - Preparing for Surgery Before surgery, you can play an important role.  Because skin is not sterile, your skin needs to be as free of germs as possible.  You can reduce the number of germs on your skin by washing with CHG (chlorahexidine gluconate) soap before surgery.  CHG is an antiseptic cleaner which kills germs and bonds with the skin to continue killing germs even after washing. Please DO NOT use if you have an allergy to CHG or antibacterial soaps.  If your skin becomes reddened/irritated stop using the CHG and inform your nurse when you arrive at Short Stay. Do not shave (including legs and underarms) for at least 48 hours prior to the first CHG shower.  You  may shave your face/neck. Please follow these instructions carefully:  1.  Shower with CHG Soap the night before surgery and the  morning of Surgery.  2.  If you choose to wash your hair, wash your hair first as usual with your  normal  shampoo.  3.  After you shampoo, rinse your hair and body thoroughly to remove the  shampoo.                           4.  Use CHG as you would any other liquid soap.  You can apply chg directly  to the skin and wash                       Gently with a scrungie or clean washcloth.  5.  Apply the CHG Soap to your body ONLY FROM THE NECK DOWN.   Do not use on face/ open                           Wound  or open sores. Avoid contact with eyes, ears mouth and genitals (private parts).                       Wash face,  Genitals (private parts) with your normal soap.             6.  Wash thoroughly, paying special attention to the area where your surgery  will be performed.  7.  Thoroughly rinse your body with warm water from the neck down.  8.  DO NOT shower/wash with your normal soap after using and rinsing off  the CHG Soap.                9.  Pat yourself dry with a clean towel.            10.  Wear clean pajamas.            11.  Place clean sheets on your bed the night of your first shower and do not  sleep with pets. Day of Surgery : Do not apply any lotions/deodorants the morning of surgery.  Please wear clean clothes to the hospital/surgery center.  FAILURE TO FOLLOW THESE INSTRUCTIONS MAY RESULT IN THE CANCELLATION OF YOUR SURGERY PATIENT SIGNATURE_________________________________  NURSE SIGNATURE__________________________________  ________________________________________________________________________

## 2022-04-18 NOTE — H&P (Signed)
TOTAL KNEE ADMISSION H&P  Patient is being admitted for left total knee arthroplasty.  Subjective:  Chief Complaint:left knee pain.  HPI: Terry Guerrero, 61 y.o. male, has a history of pain and functional disability in the left knee due to arthritis and has failed non-surgical conservative treatments for greater than 12 weeks to includeNSAID's and/or analgesics, corticosteriod injections, flexibility and strengthening excercises, use of assistive devices, and activity modification.  Onset of symptoms was gradual, starting >10 years ago with gradually worsening course since that time. The patient noted prior procedures on the knee to include  menisectomy and ACL reconstruction on the left knee(s).  Patient currently rates pain in the left knee(s) at 8 out of 10 with activity. Patient has night pain, worsening of pain with activity and weight bearing, pain that interferes with activities of daily living, pain with passive range of motion, crepitus, and joint swelling.  Patient has evidence of periarticular osteophytes and joint space narrowing by imaging studies.  There is no active infection.  Patient Active Problem List   Diagnosis Date Noted   Ulcerative pancolitis without complication ([place]) 74/11/1446   Syncope and collapse 04/12/2021   Rectal bleeding 05/07/2019   Strain of left pectoralis muscle 02/13/2017   Rheumatoid arthritis ([place])    Past Medical History:  Diagnosis Date   Arthritis    GERD (gastroesophageal reflux disease)    Rheumatoid arthritis(714.0)    Seizures ([place]) 12/2020   Ulcerative colitis ([place])     Past Surgical History:  Procedure Laterality Date   ANTERIOR CRUCIATE LIGAMENT REPAIR     Left knee   COLONOSCOPY     HERNIA REPAIR  1856   umbilical    Current Outpatient Medications  Medication Sig Dispense Refill Last Dose   Adalimumab (HUMIRA PEN) 40 MG/0.4ML PNKT Inject 40 mg into the skin every 14 (fourteen) days. 6 each 0    Oxcarbazepine (TRILEPTAL) 300 MG  tablet Take 2 tablets twice a day 120 tablet 11    No current facility-administered medications for this visit.   Allergies  Allergen Reactions   Gold-Containing Drug Products Shortness Of Breath and Rash   Lamisil [Terbinafine] Other (See Comments)    The generic made his skin flaky and red itchy rash   Naprosyn [Naproxen]     Unknown reaction     Social History   Tobacco Use   Smoking status: Every Day    Packs/day: 1.00    Years: 25.00    Total pack years: 25.00    Types: Cigarettes   Smokeless tobacco: Never  Substance Use Topics   Alcohol use: Not Currently    Family History  Problem Relation Age of Onset   Alcoholism Mother    Colon cancer Neg Hx    Esophageal cancer Neg Hx    Stomach cancer Neg Hx    Rectal cancer Neg Hx      Review of Systems  Musculoskeletal:  Positive for arthralgias.  All other systems reviewed and are negative.   Objective:  Physical Exam Constitutional:      General: He is not in acute distress.    Appearance: Normal appearance.  HENT:     Head: Normocephalic and atraumatic.  Eyes:     Extraocular Movements: Extraocular movements intact.     Pupils: Pupils are equal, round, and reactive to light.  Cardiovascular:     Rate and Rhythm: Normal rate and regular rhythm.     Pulses: Normal pulses.     Heart sounds:  Normal heart sounds.  Pulmonary:     Effort: Pulmonary effort is normal. No respiratory distress.     Breath sounds: Normal breath sounds. No wheezing.  Abdominal:     General: Abdomen is flat. Bowel sounds are normal. There is no distension.     Palpations: Abdomen is soft.     Tenderness: There is no abdominal tenderness.  Musculoskeletal:     Cervical back: Normal range of motion and neck supple.     Comments: Left knee range of motion is 0-100 degrees.  Well healed incision from prior ACL reconstruction.  Knee feels stable to varus and valgus stress.  Negative anterior and posterior drawer.  Intact ankle dorsiflexion  and plantarflexion.  Sensation intact distally.    Lymphadenopathy:     Cervical: No cervical adenopathy.  Skin:    General: Skin is warm and dry.     Findings: No erythema or rash.  Neurological:     General: No focal deficit present.     Mental Status: He is alert and oriented to person, place, and time.  Psychiatric:        Mood and Affect: Mood normal.        Behavior: Behavior normal.     Vital signs in last 24 hours: @VSRANGES @  Labs:   Estimated body mass index is 22.82 kg/m as calculated from the following:   Height as of 03/02/22: 5' 6"  (1.676 m).   Weight as of 03/02/22: 64.1 kg.   Imaging Review Plain radiographs demonstrate moderate degenerative joint disease of the left knee(s). The overall alignment issignificant valgus. The bone quality appears to be good for age and reported activity level.      Assessment/Plan:  End stage arthritis, left knee   The patient history, physical examination, clinical judgment of the provider and imaging studies are consistent with end stage degenerative joint disease of the left knee(s) and total knee arthroplasty is deemed medically necessary. The treatment options including medical management, injection therapy arthroscopy and arthroplasty were discussed at length. The risks and benefits of total knee arthroplasty were presented and reviewed. The risks due to aseptic loosening, infection, stiffness, patella tracking problems, thromboembolic complications and other imponderables were discussed. The patient acknowledged the explanation, agreed to proceed with the plan and consent was signed. Patient is being admitted for inpatient treatment for surgery, pain control, PT, OT, prophylactic antibiotics, VTE prophylaxis, progressive ambulation and ADL's and discharge planning. The patient is planning to be discharged  home with outpt PT.     Patient's anticipated LOS is less than 2 midnights, meeting these requirements: - Younger  than 68 - Lives within 1 hour of care - Has a competent adult at home to recover with post-op recover - NO history of  - Chronic pain requiring opiods  - Diabetes  - Coronary Artery Disease  - Heart failure  - Heart attack  - Stroke  - DVT/VTE  - Cardiac arrhythmia  - Respiratory Failure/COPD  - Renal failure  - Anemia  - Advanced Liver disease

## 2022-04-20 ENCOUNTER — Encounter (HOSPITAL_COMMUNITY)
Admission: RE | Admit: 2022-04-20 | Discharge: 2022-04-20 | Disposition: A | Discharge status: Home / Self Care | Payer: Medicare HMO | Source: Ambulatory Visit | Attending: Orthopedic Surgery | Admitting: Orthopedic Surgery

## 2022-04-20 ENCOUNTER — Encounter (HOSPITAL_COMMUNITY): Payer: Self-pay

## 2022-04-20 ENCOUNTER — Other Ambulatory Visit: Payer: Self-pay

## 2022-04-20 VITALS — BP 130/94 | HR 66 | Temp 98.1°F | Resp 16 | Ht 66.0 in | Wt 142.0 lb

## 2022-04-20 DIAGNOSIS — Z01818 Encounter for other preprocedural examination: Secondary | ICD-10-CM | POA: Insufficient documentation

## 2022-04-20 DIAGNOSIS — G8929 Other chronic pain: Secondary | ICD-10-CM | POA: Diagnosis not present

## 2022-04-20 DIAGNOSIS — M25562 Pain in left knee: Secondary | ICD-10-CM | POA: Insufficient documentation

## 2022-04-20 LAB — SURGICAL PCR SCREEN
MRSA, PCR: NEGATIVE
Staphylococcus aureus: NEGATIVE

## 2022-04-20 LAB — TYPE AND SCREEN
ABO/RH(D): B POS
Antibody Screen: NEGATIVE

## 2022-04-20 LAB — CBC WITH DIFFERENTIAL/PLATELET
Abs Immature Granulocytes: 0.02 10*3/uL (ref 0.00–0.07)
Basophils Absolute: 0.1 10*3/uL (ref 0.0–0.1)
Basophils Relative: 1 %
Eosinophils Absolute: 0.4 10*3/uL (ref 0.0–0.5)
Eosinophils Relative: 5 %
HCT: 46 % (ref 39.0–52.0)
Hemoglobin: 15 g/dL (ref 13.0–17.0)
Immature Granulocytes: 0 %
Lymphocytes Relative: 33 %
Lymphs Abs: 3 10*3/uL (ref 0.7–4.0)
MCH: 32.2 pg (ref 26.0–34.0)
MCHC: 32.6 g/dL (ref 30.0–36.0)
MCV: 98.7 fL (ref 80.0–100.0)
Monocytes Absolute: 0.7 10*3/uL (ref 0.1–1.0)
Monocytes Relative: 8 %
Neutro Abs: 4.9 10*3/uL (ref 1.7–7.7)
Neutrophils Relative %: 53 %
Platelets: 316 10*3/uL (ref 150–400)
RBC: 4.66 MIL/uL (ref 4.22–5.81)
RDW: 13.2 % (ref 11.5–15.5)
WBC: 9.1 10*3/uL (ref 4.0–10.5)
nRBC: 0 % (ref 0.0–0.2)

## 2022-04-20 LAB — COMPREHENSIVE METABOLIC PANEL
ALT: 14 U/L (ref 0–44)
AST: 20 U/L (ref 15–41)
Albumin: 4.3 g/dL (ref 3.5–5.0)
Alkaline Phosphatase: 71 U/L (ref 38–126)
Anion gap: 6 (ref 5–15)
BUN: 10 mg/dL (ref 6–20)
CO2: 27 mmol/L (ref 22–32)
Calcium: 9.4 mg/dL (ref 8.9–10.3)
Chloride: 106 mmol/L (ref 98–111)
Creatinine, Ser: 0.79 mg/dL (ref 0.61–1.24)
GFR, Estimated: >60 mL/min (ref >60)
Glucose, Bld: 104 mg/dL — ABNORMAL HIGH (ref 70–99)
Potassium: 4.9 mmol/L (ref 3.5–5.1)
Sodium: 139 mmol/L (ref 135–145)
Total Bilirubin: 0.5 mg/dL (ref 0.3–1.2)
Total Protein: 8.2 g/dL — ABNORMAL HIGH (ref 6.5–8.1)

## 2022-04-30 NOTE — Care Plan (Signed)
Ortho Bundle Case Management Note  Patient Details  Name: Terry Guerrero MRN: [id number] Date of Birth: [birth year]   seen in the office for H&P visit. He will discharge to home with family to assist. has crutches to use. not able to use a walker due to RA in hands. OPPT set up with [facility]. discharge instructions discussed and questions answered. appointments confirmed. Patient and MD in agreement with plan. Choice offered.                   DME Arranged:    DME Agency:     HH Arranged:    HH Agency:     Additional Comments: Please contact me with any questions of if this plan should need to change.  Ladell Heads,  [place] Specialist  [phone] 04/30/2022, 11:13 AM

## 2022-05-01 MED ORDER — TRANEXAMIC ACID 1000 MG/10ML IV SOLN
2000.0000 mg | INTRAVENOUS | Status: DC
  Filled 2022-05-01: qty 20

## 2022-05-01 NOTE — Anesthesia Preprocedure Evaluation (Signed)
Anesthesia Evaluation  Patient identified by MRN, date of birth, ID band Patient awake    Reviewed: Allergy & Precautions, H&P , NPO status , Patient's Chart, lab work & pertinent test results  Airway Mallampati: II  TM Distance: >3 FB Neck ROM: Full    Dental no notable dental hx. (+) Poor Dentition, Chipped, Missing, Dental Advisory Given,    Pulmonary neg pulmonary ROS, Patient abstained from smoking., former smoker   Pulmonary exam normal breath sounds clear to auscultation       Cardiovascular Exercise Tolerance: Good negative cardio ROS Normal cardiovascular exam Rhythm:Regular Rate:Normal  ECHO 2/23 Normal   Neuro/Psych Seizures -, Well Controlled,   Neuromuscular disease negative neurological ROS  negative psych ROS   GI/Hepatic negative GI ROS, Neg liver ROS, PUD,,,  Endo/Other  negative endocrine ROS    Renal/GU negative Renal ROS  negative genitourinary   Musculoskeletal negative musculoskeletal ROS (+) Arthritis , Rheumatoid disorders,    Abdominal   Peds negative pediatric ROS (+)  Hematology negative hematology ROS (+)   Anesthesia Other Findings   Reproductive/Obstetrics negative OB ROS                             Anesthesia Physical Anesthesia Plan  ASA: 3  Anesthesia Plan: MAC, Regional and Spinal   Post-op Pain Management: Regional block*   Induction: Intravenous  PONV Risk Score and Plan: 1 and Ondansetron, Propofol infusion and Treatment may vary due to age or medical condition  Airway Management Planned: Natural Airway and Simple Face Mask  Additional Equipment: None  Intra-op Plan:   Post-operative Plan:   Informed Consent: I have reviewed the patients History and Physical, chart, labs and discussed the procedure including the risks, benefits and alternatives for the proposed anesthesia with the patient or authorized representative who has indicated  his/her understanding and acceptance.       Plan Discussed with: Anesthesiologist and CRNA  Anesthesia Plan Comments: (  )       Anesthesia Quick Evaluation

## 2022-05-02 ENCOUNTER — Other Ambulatory Visit: Payer: Self-pay

## 2022-05-02 ENCOUNTER — Ambulatory Visit (HOSPITAL_BASED_OUTPATIENT_CLINIC_OR_DEPARTMENT_OTHER): Payer: Medicare HMO | Admitting: Anesthesiology

## 2022-05-02 ENCOUNTER — Encounter (HOSPITAL_COMMUNITY)
Admission: RE | Disposition: A | Discharge status: Home / Self Care | Payer: Self-pay | Source: Home / Self Care | Attending: Orthopedic Surgery

## 2022-05-02 ENCOUNTER — Ambulatory Visit (HOSPITAL_COMMUNITY): Payer: Medicare HMO

## 2022-05-02 ENCOUNTER — Ambulatory Visit (HOSPITAL_COMMUNITY)
Admission: RE | Admit: 2022-05-02 | Discharge: 2022-05-02 | Disposition: A | Discharge status: Home / Self Care | Payer: Medicare HMO | Attending: Orthopedic Surgery | Admitting: Orthopedic Surgery

## 2022-05-02 ENCOUNTER — Ambulatory Visit (HOSPITAL_COMMUNITY): Payer: Medicare HMO | Admitting: Physician Assistant

## 2022-05-02 ENCOUNTER — Encounter (HOSPITAL_COMMUNITY): Payer: Self-pay | Admitting: Orthopedic Surgery

## 2022-05-02 DIAGNOSIS — G709 Myoneural disorder, unspecified: Secondary | ICD-10-CM

## 2022-05-02 DIAGNOSIS — Z87891 Personal history of nicotine dependence: Secondary | ICD-10-CM

## 2022-05-02 DIAGNOSIS — M1712 Unilateral primary osteoarthritis, left knee: Secondary | ICD-10-CM | POA: Diagnosis not present

## 2022-05-02 DIAGNOSIS — R569 Unspecified convulsions: Secondary | ICD-10-CM | POA: Diagnosis not present

## 2022-05-02 DIAGNOSIS — K279 Peptic ulcer, site unspecified, unspecified as acute or chronic, without hemorrhage or perforation: Secondary | ICD-10-CM | POA: Insufficient documentation

## 2022-05-02 DIAGNOSIS — M0609 Rheumatoid arthritis without rheumatoid factor, multiple sites: Secondary | ICD-10-CM

## 2022-05-02 DIAGNOSIS — M25462 Effusion, left knee: Secondary | ICD-10-CM | POA: Diagnosis not present

## 2022-05-02 DIAGNOSIS — Z79899 Other long term (current) drug therapy: Secondary | ICD-10-CM

## 2022-05-02 DIAGNOSIS — G8918 Other acute postprocedural pain: Secondary | ICD-10-CM | POA: Diagnosis not present

## 2022-05-02 DIAGNOSIS — Z8719 Personal history of other diseases of the digestive system: Secondary | ICD-10-CM

## 2022-05-02 DIAGNOSIS — Z96652 Presence of left artificial knee joint: Secondary | ICD-10-CM | POA: Diagnosis not present

## 2022-05-02 DIAGNOSIS — M069 Rheumatoid arthritis, unspecified: Secondary | ICD-10-CM | POA: Diagnosis not present

## 2022-05-02 DIAGNOSIS — Z471 Aftercare following joint replacement surgery: Secondary | ICD-10-CM | POA: Diagnosis not present

## 2022-05-02 HISTORY — PX: TOTAL KNEE ARTHROPLASTY: SHX125

## 2022-05-02 LAB — ABO/RH: ABO/RH(D): B POS

## 2022-05-02 SURGERY — ARTHROPLASTY, KNEE, TOTAL
Anesthesia: Monitor Anesthesia Care | Site: Knee | Laterality: Left

## 2022-05-02 MED ORDER — BUPIVACAINE LIPOSOME 1.3 % IJ SUSP
INTRAMUSCULAR | Status: DC | PRN
  Administered 2022-05-02: 20 mL

## 2022-05-02 MED ORDER — DEXAMETHASONE SODIUM PHOSPHATE 10 MG/ML IJ SOLN
INTRAMUSCULAR | Status: DC | PRN
  Administered 2022-05-02: 10 mg via INTRAVENOUS

## 2022-05-02 MED ORDER — PROPOFOL 500 MG/50ML IV EMUL
INTRAVENOUS | Status: DC | PRN
  Administered 2022-05-02: 75 ug/kg/min via INTRAVENOUS

## 2022-05-02 MED ORDER — FENTANYL CITRATE (PF) 100 MCG/2ML IJ SOLN
INTRAMUSCULAR | Status: DC | PRN
  Administered 2022-05-02: 100 ug via INTRAVENOUS

## 2022-05-02 MED ORDER — METHOCARBAMOL 500 MG PO TABS: 500.0000 mg | ORAL_TABLET | Freq: Three times a day (TID) | ORAL | 0 refills | Status: AC | PRN

## 2022-05-02 MED ORDER — ONDANSETRON HCL 4 MG/2ML IJ SOLN: 4.0000 mg | Freq: Once | INTRAMUSCULAR | Status: DC | PRN

## 2022-05-02 MED ORDER — PROPOFOL 10 MG/ML IV BOLUS
INTRAVENOUS | Status: DC | PRN
  Administered 2022-05-02: 40 mg via INTRAVENOUS

## 2022-05-02 MED ORDER — OXYCODONE HCL 5 MG PO TABS
ORAL_TABLET | ORAL | Status: AC
  Filled 2022-05-02: qty 1

## 2022-05-02 MED ORDER — HYDROCODONE-ACETAMINOPHEN 5-325 MG PO TABS: 1.0000 | ORAL_TABLET | ORAL | 0 refills | Status: AC | PRN

## 2022-05-02 MED ORDER — METHOCARBAMOL 500 MG PO TABS: 500.0000 mg | ORAL_TABLET | Freq: Four times a day (QID) | ORAL | Status: DC | PRN

## 2022-05-02 MED ORDER — BUPIVACAINE LIPOSOME 1.3 % IJ SUSP: 20.0000 mL | Freq: Once | INTRAMUSCULAR | Status: DC

## 2022-05-02 MED ORDER — ACETAMINOPHEN 500 MG PO TABS: 1000.0000 mg | ORAL_TABLET | Freq: Three times a day (TID) | ORAL | 0 refills | Status: AC | PRN

## 2022-05-02 MED ORDER — ASPIRIN 81 MG PO TBEC: 81.0000 mg | DELAYED_RELEASE_TABLET | Freq: Two times a day (BID) | ORAL | 0 refills | Status: AC

## 2022-05-02 MED ORDER — LACTATED RINGERS IV BOLUS
250.0000 mL | Freq: Once | INTRAVENOUS | Status: AC
  Administered 2022-05-02: 250 mL via INTRAVENOUS

## 2022-05-02 MED ORDER — FENTANYL CITRATE (PF) 100 MCG/2ML IJ SOLN
INTRAMUSCULAR | Status: AC
  Filled 2022-05-02: qty 2

## 2022-05-02 MED ORDER — CEFAZOLIN SODIUM-DEXTROSE 2-4 GM/100ML-% IV SOLN
INTRAVENOUS | Status: AC
  Administered 2022-05-02: 2 g via INTRAVENOUS
  Filled 2022-05-02: qty 100

## 2022-05-02 MED ORDER — POVIDONE-IODINE 10 % EX SWAB
2.0000 | Freq: Once | CUTANEOUS | Status: AC
  Administered 2022-05-02: 2 via TOPICAL

## 2022-05-02 MED ORDER — ONDANSETRON HCL 4 MG/2ML IJ SOLN
INTRAMUSCULAR | Status: AC
  Filled 2022-05-02: qty 2

## 2022-05-02 MED ORDER — PHENYLEPHRINE HCL (PRESSORS) 10 MG/ML IV SOLN
INTRAVENOUS | Status: AC
  Filled 2022-05-02: qty 1

## 2022-05-02 MED ORDER — ACETAMINOPHEN 500 MG PO TABS
1000.0000 mg | ORAL_TABLET | Freq: Once | ORAL | Status: AC
  Administered 2022-05-02: 1000 mg via ORAL
  Filled 2022-05-02: qty 2

## 2022-05-02 MED ORDER — CEFAZOLIN SODIUM-DEXTROSE 2-4 GM/100ML-% IV SOLN: 2.0000 g | Freq: Four times a day (QID) | INTRAVENOUS | Status: DC

## 2022-05-02 MED ORDER — SODIUM CHLORIDE (PF) 0.9 % IJ SOLN
INTRAMUSCULAR | Status: AC
  Filled 2022-05-02: qty 50

## 2022-05-02 MED ORDER — LACTATED RINGERS IV SOLN: INTRAVENOUS | Status: DC

## 2022-05-02 MED ORDER — HYDROCODONE-ACETAMINOPHEN 5-325 MG PO TABS
1.0000 | ORAL_TABLET | ORAL | Status: DC | PRN
  Administered 2022-05-02: 1 via ORAL

## 2022-05-02 MED ORDER — METHOCARBAMOL 500 MG IVPB - SIMPLE MED: 500.0000 mg | Freq: Four times a day (QID) | INTRAVENOUS | Status: DC | PRN

## 2022-05-02 MED ORDER — FENTANYL CITRATE PF 50 MCG/ML IJ SOSY: 25.0000 ug | PREFILLED_SYRINGE | INTRAMUSCULAR | Status: DC | PRN

## 2022-05-02 MED ORDER — 0.9 % SODIUM CHLORIDE (POUR BTL) OPTIME
TOPICAL | Status: DC | PRN
  Administered 2022-05-02: 1000 mL

## 2022-05-02 MED ORDER — BUPIVACAINE LIPOSOME 1.3 % IJ SUSP
INTRAMUSCULAR | Status: AC
  Filled 2022-05-02: qty 20

## 2022-05-02 MED ORDER — SODIUM CHLORIDE (PF) 0.9 % IJ SOLN
INTRAMUSCULAR | Status: DC | PRN
  Administered 2022-05-02: 60 mL

## 2022-05-02 MED ORDER — BUPIVACAINE IN DEXTROSE 0.75-8.25 % IT SOLN
INTRATHECAL | Status: DC | PRN
  Administered 2022-05-02: 1.7 mL via INTRATHECAL

## 2022-05-02 MED ORDER — MIDAZOLAM HCL 2 MG/2ML IJ SOLN
INTRAMUSCULAR | Status: AC
  Filled 2022-05-02: qty 2

## 2022-05-02 MED ORDER — MEPERIDINE HCL 50 MG/ML IJ SOLN: 6.2500 mg | INTRAMUSCULAR | Status: DC | PRN

## 2022-05-02 MED ORDER — DEXAMETHASONE SODIUM PHOSPHATE 10 MG/ML IJ SOLN: 8.0000 mg | Freq: Once | INTRAMUSCULAR | Status: DC

## 2022-05-02 MED ORDER — ROPIVACAINE HCL 5 MG/ML IJ SOLN
INTRAMUSCULAR | Status: DC | PRN
  Administered 2022-05-02: 30 mL via PERINEURAL

## 2022-05-02 MED ORDER — ACETAMINOPHEN 325 MG PO TABS: 325.0000 mg | ORAL_TABLET | ORAL | Status: DC | PRN

## 2022-05-02 MED ORDER — WATER FOR IRRIGATION, STERILE IR SOLN
Status: DC | PRN
  Administered 2022-05-02: 2000 mL

## 2022-05-02 MED ORDER — ORAL CARE MOUTH RINSE: 15.0000 mL | Freq: Once | OROMUCOSAL | Status: AC

## 2022-05-02 MED ORDER — CHLORHEXIDINE GLUCONATE 0.12 % MT SOLN
15.0000 mL | Freq: Once | OROMUCOSAL | Status: AC
  Administered 2022-05-02: 15 mL via OROMUCOSAL

## 2022-05-02 MED ORDER — OXYCODONE HCL 5 MG PO TABS: 5.0000 mg | ORAL_TABLET | Freq: Once | ORAL | Status: DC | PRN

## 2022-05-02 MED ORDER — ONDANSETRON HCL 4 MG PO TABS: 4.0000 mg | ORAL_TABLET | Freq: Three times a day (TID) | ORAL | 0 refills | Status: AC | PRN

## 2022-05-02 MED ORDER — ONDANSETRON HCL 4 MG/2ML IJ SOLN
INTRAMUSCULAR | Status: DC | PRN
  Administered 2022-05-02: 4 mg via INTRAVENOUS

## 2022-05-02 MED ORDER — ISOPROPYL ALCOHOL 70 % SOLN
Status: DC | PRN
  Administered 2022-05-02: 1 via TOPICAL

## 2022-05-02 MED ORDER — PHENYLEPHRINE HCL-NACL 20-0.9 MG/250ML-% IV SOLN
INTRAVENOUS | Status: DC | PRN
  Administered 2022-05-02: 40 ug/min via INTRAVENOUS

## 2022-05-02 MED ORDER — CEFAZOLIN SODIUM-DEXTROSE 2-4 GM/100ML-% IV SOLN
2.0000 g | INTRAVENOUS | Status: AC
  Administered 2022-05-02: 2 g via INTRAVENOUS
  Filled 2022-05-02: qty 100

## 2022-05-02 MED ORDER — ACETAMINOPHEN 160 MG/5ML PO SOLN: 325.0000 mg | ORAL | Status: DC | PRN

## 2022-05-02 MED ORDER — PROPOFOL 1000 MG/100ML IV EMUL
INTRAVENOUS | Status: AC
  Filled 2022-05-02: qty 100

## 2022-05-02 MED ORDER — LACTATED RINGERS IV BOLUS: 250.0000 mL | Freq: Once | INTRAVENOUS | Status: DC

## 2022-05-02 MED ORDER — OXYCODONE HCL 5 MG/5ML PO SOLN: 5.0000 mg | Freq: Once | ORAL | Status: DC | PRN

## 2022-05-02 MED ORDER — TRANEXAMIC ACID-NACL 1000-0.7 MG/100ML-% IV SOLN
1000.0000 mg | INTRAVENOUS | Status: AC
  Administered 2022-05-02: 1000 mg via INTRAVENOUS
  Filled 2022-05-02: qty 100

## 2022-05-02 MED ORDER — HYDROCODONE-ACETAMINOPHEN 5-325 MG PO TABS
ORAL_TABLET | ORAL | Status: AC
  Filled 2022-05-02: qty 1

## 2022-05-02 MED ORDER — ISOPROPYL ALCOHOL 70 % SOLN
Status: AC
  Filled 2022-05-02: qty 480

## 2022-05-02 MED ORDER — CEFADROXIL 500 MG PO CAPS: 500.0000 mg | ORAL_CAPSULE | Freq: Two times a day (BID) | ORAL | 0 refills | Status: AC

## 2022-05-02 MED ORDER — DEXAMETHASONE SODIUM PHOSPHATE 10 MG/ML IJ SOLN
INTRAMUSCULAR | Status: AC
  Filled 2022-05-02: qty 1

## 2022-05-02 MED ORDER — MIDAZOLAM HCL 5 MG/5ML IJ SOLN
INTRAMUSCULAR | Status: DC | PRN
  Administered 2022-05-02: 2 mg via INTRAVENOUS

## 2022-05-02 MED ORDER — SODIUM CHLORIDE (PF) 0.9 % IJ SOLN
INTRAMUSCULAR | Status: AC
  Filled 2022-05-02: qty 10

## 2022-05-02 MED ORDER — HUMIRA (2 PEN) 40 MG/0.4ML ~~LOC~~ AJKT: 40.0000 mg | AUTO-INJECTOR | SUBCUTANEOUS | 0 refills | Status: DC

## 2022-05-02 MED ORDER — SODIUM CHLORIDE 0.9 % IV SOLN: INTRAVENOUS | Status: DC

## 2022-05-02 MED ORDER — LACTATED RINGERS IV BOLUS
500.0000 mL | Freq: Once | INTRAVENOUS | Status: AC
  Administered 2022-05-02: 500 mL via INTRAVENOUS

## 2022-05-02 MED ORDER — SODIUM CHLORIDE 0.9 % IR SOLN
Status: DC | PRN
  Administered 2022-05-02 (×2): 1000 mL

## 2022-05-02 SURGICAL SUPPLY — 65 items
ANCHOR JUGGERKNOT WTAP NDL 2.9 (Anchor) IMPLANT
BAG COUNTER SPONGE SURGICOUNT (BAG) IMPLANT
BIT DRILL JUGGERKNOT 2.9 (BIT) IMPLANT
BLADE SAG 18X100X1.27 (BLADE) ×1 IMPLANT
BLADE SAW SAG 35X64 .89 (BLADE) ×1 IMPLANT
BNDG COHESIVE 3X5 TAN ST LF (GAUZE/BANDAGES/DRESSINGS) ×1 IMPLANT
BNDG ELASTIC 6X10 VLCR STRL LF (GAUZE/BANDAGES/DRESSINGS) ×1 IMPLANT
BOWL SMART MIX CTS (DISPOSABLE) ×1 IMPLANT
CEMENT BONE R 1X40 (Cement) IMPLANT
CEMENT BONE REFOBACIN R1X40 US (Cement) IMPLANT
CHLORAPREP W/TINT 26 (MISCELLANEOUS) ×2 IMPLANT
COVER SURGICAL LIGHT HANDLE (MISCELLANEOUS) ×1 IMPLANT
CUFF TOURN SGL QUICK 34 (TOURNIQUET CUFF) ×1
CUFF TRNQT CYL 34X4.125X (TOURNIQUET CUFF) ×1 IMPLANT
DERMABOND ADVANCED .7 DNX12 (GAUZE/BANDAGES/DRESSINGS) ×1 IMPLANT
DRAPE INCISE IOBAN 85X60 (DRAPES) ×1 IMPLANT
DRAPE SHEET LG 3/4 BI-LAMINATE (DRAPES) ×1 IMPLANT
DRAPE U-SHAPE 47X51 STRL (DRAPES) ×1 IMPLANT
DRESSING AQUACEL AG SP 3.5X10 (GAUZE/BANDAGES/DRESSINGS) ×1 IMPLANT
DRSG AQUACEL AG ADV 3.5X10 (GAUZE/BANDAGES/DRESSINGS) IMPLANT
DRSG AQUACEL AG SP 3.5X10 (GAUZE/BANDAGES/DRESSINGS) ×1
ELECT REM PT RETURN 15FT ADLT (MISCELLANEOUS) ×1 IMPLANT
FEMORAL KNEE COMP SZ 8 STND LT (Knees) ×1 IMPLANT
FEMORAL KNEE COMP SZ 8STD LT (Knees) IMPLANT
GAUZE SPONGE 4X4 12PLY STRL (GAUZE/BANDAGES/DRESSINGS) ×1 IMPLANT
GLOVE BIO SURGEON STRL SZ 6.5 (GLOVE) ×2 IMPLANT
GLOVE BIOGEL PI IND STRL 6.5 (GLOVE) ×1 IMPLANT
GLOVE BIOGEL PI IND STRL 8 (GLOVE) ×1 IMPLANT
GLOVE SURG ORTHO 8.0 STRL STRW (GLOVE) ×2 IMPLANT
GOWN STRL REUS W/ TWL XL LVL3 (GOWN DISPOSABLE) ×2 IMPLANT
GOWN STRL REUS W/TWL XL LVL3 (GOWN DISPOSABLE) ×2
HANDPIECE INTERPULSE COAX TIP (DISPOSABLE) ×1
HDLS TROCR DRIL PIN KNEE 75 (PIN) ×1
HOLDER FOLEY CATH W/STRAP (MISCELLANEOUS) ×1 IMPLANT
HOOD PEEL AWAY T7 (MISCELLANEOUS) ×3 IMPLANT
MANIFOLD NEPTUNE II (INSTRUMENTS) ×1 IMPLANT
MARKER SKIN DUAL TIP RULER LAB (MISCELLANEOUS) ×1 IMPLANT
NS IRRIG 1000ML POUR BTL (IV SOLUTION) ×1 IMPLANT
PACK TOTAL KNEE CUSTOM (KITS) ×1 IMPLANT
PIN DRILL HDLS TROCAR 75 4PK (PIN) IMPLANT
PROTECTOR NERVE ULNAR (MISCELLANEOUS) ×1 IMPLANT
SCREW HEADED 33MM KNEE (MISCELLANEOUS) IMPLANT
SET HNDPC FAN SPRY TIP SCT (DISPOSABLE) ×1 IMPLANT
SOLUTION IRRIG SURGIPHOR (IV SOLUTION) IMPLANT
SPIKE FLUID TRANSFER (MISCELLANEOUS) ×1 IMPLANT
STEM POLY PAT PLY 35M KNEE (Knees) IMPLANT
STEM TIB ST PERS 14+30 (Stem) IMPLANT
STEM TIBIA 5 DEG SZ F L KNEE (Knees) IMPLANT
STEM TIBIAL 10 8-11 EF POLY LT (Joint) IMPLANT
STRIP CLOSURE SKIN 1/2X4 (GAUZE/BANDAGES/DRESSINGS) ×1 IMPLANT
SUCTION FRAZIER HANDLE 12FR (TUBING) ×1
SUCTION TUBE FRAZIER 12FR DISP (TUBING) IMPLANT
SUT MNCRL AB 3-0 PS2 18 (SUTURE) ×1 IMPLANT
SUT STRATAFIX 0 PDS 27 VIOLET (SUTURE) ×1
SUT STRATAFIX PDO 1 14 VIOLET (SUTURE) ×1
SUT STRATFX PDO 1 14 VIOLET (SUTURE) ×1
SUT VIC AB 2-0 CT2 27 (SUTURE) ×2 IMPLANT
SUTURE STRATFX 0 PDS 27 VIOLET (SUTURE) ×1 IMPLANT
SUTURE STRATFX PDO 1 14 VIOLET (SUTURE) ×1 IMPLANT
SYR 50ML LL SCALE MARK (SYRINGE) ×1 IMPLANT
TIBIA STEM 5 DEG SZ F L KNEE (Knees) ×1 IMPLANT
TRAY FOLEY MTR SLVR 14FR STAT (SET/KITS/TRAYS/PACK) IMPLANT
TUBE SUCTION HIGH CAP CLEAR NV (SUCTIONS) ×1 IMPLANT
UNDERPAD 30X36 HEAVY ABSORB (UNDERPADS AND DIAPERS) ×1 IMPLANT
WRAP KNEE MAXI GEL POST OP (GAUZE/BANDAGES/DRESSINGS) IMPLANT

## 2022-05-02 NOTE — Anesthesia Postprocedure Evaluation (Signed)
Anesthesia Post Note  Patient: Terry Guerrero  Procedure(s) Performed: TOTAL KNEE ARTHROPLASTY (Left: Knee)     Patient location during evaluation: PACU Anesthesia Type: Regional and Spinal Level of consciousness: oriented and awake and alert Pain management: pain level controlled Vital Signs Assessment: post-procedure vital signs reviewed and stable Respiratory status: spontaneous breathing, respiratory function stable and patient connected to nasal cannula oxygen Cardiovascular status: blood pressure returned to baseline and stable Postop Assessment: no headache, no backache and no apparent nausea or vomiting Anesthetic complications: no  No notable events documented.  Last Vitals:  Vitals:   05/02/22 1200 05/02/22 1215  BP: (!) 136/92 129/88  Pulse: 61 61  Resp: 20 10  Temp:    SpO2: 100% 96%    Last Pain:  Vitals:   05/02/22 1215  TempSrc:   PainSc: 0-No pain                 Verlaine Embry

## 2022-05-02 NOTE — Transfer of Care (Signed)
Immediate Anesthesia Transfer of Care Note  Patient: Terry Guerrero  Procedure(s) Performed: TOTAL KNEE ARTHROPLASTY (Left: Knee)  Patient Location: PACU  Anesthesia Type:Spinal  Level of Consciousness: drowsy  Airway & Oxygen Therapy: Patient Spontanous Breathing and Patient connected to face mask oxygen  Post-op Assessment: Report given to RN and Post -op Vital signs reviewed and stable  Post vital signs: Reviewed and stable  Last Vitals:  Vitals Value Taken Time  BP 112/73   Temp    Pulse 57 05/02/22 1057  Resp 12 05/02/22 1058  SpO2 100 % 05/02/22 1057  Vitals shown include unvalidated device data.  Last Pain:  Vitals:   05/02/22 0652  TempSrc:   PainSc: 0-No pain         Complications: No notable events documented.

## 2022-05-02 NOTE — Discharge Instructions (Signed)

## 2022-05-02 NOTE — Op Note (Signed)
DATE OF SURGERY:  05/02/2022 TIME: 10:59 AM  PATIENT NAME:  Terry Guerrero   AGE: 62 y.o.   PRE-OPERATIVE DIAGNOSIS: End-stage left knee osteoarthritis  POST-OPERATIVE DIAGNOSIS:  Same  PROCEDURE: Left total Knee Arthroplasty  SURGEON:  Kynlee Koenigsberg A Vitaliy Eisenhour, MD   ASSISTANT: Izola Price, RNFA, present and scrubbed throughout the case, critical for assistance with exposure, retraction, instrumentation, and closure.   OPERATIVE IMPLANTS:  Cemented Zimmer persona left CR narrow femur, left F tibia, 10 mm MC polyethylene insert, 35 mm patella Implant Name Type Inv. Item Serial No. Manufacturer Lot No. LRB No. Used Action  CEMENT BONE REFOBACIN R1X40 [place] - [id number] Cement CEMENT BONE REFOBACIN R1X40 [place]  ZIMMER [employer]ON(ORTH,TRAU,BIO,SG) [id number] Left 1 Implanted  CEMENT BONE REFOBACIN R1X40 [place] - [id number] Cement CEMENT BONE REFOBACIN R1X40 [place]  ZIMMER RECON(ORTH,TRAU,BIO,SG) [id number] Left 1 Implanted  FEMORAL KNEE COMP SZ 8 STND LT - [id number] Knees FEMORAL KNEE COMP SZ 8 STND LT  ZIMMER RECON(ORTH,TRAU,BIO,SG) [id number] Left 1 Implanted  TIBIA STEM 5 DEG SZ F L KNEE - [id number] Knees TIBIA STEM 5 DEG SZ F L KNEE  ZIMMER RECON(ORTH,TRAU,BIO,SG) [id number] Left 1 Implanted  STEM POLY PAT PLY 69M KNEE - [id number] Knees STEM POLY PAT PLY 69M KNEE  ZIMMER RECON(ORTH,TRAU,BIO,SG) [id number] Left 1 Implanted  STEM TIB ST PERS 14+30 - [id number] Stem STEM TIB ST PERS 14+30  ZIMMER RECON(ORTH,TRAU,BIO,SG) [id number] Left 1 Implanted  ANCHOR JUGGERKNOT WTAP NDL 2.9 - [id number] Anchor ANCHOR JUGGERKNOT WTAP NDL 2.9  ZIMMER RECON(ORTH,TRAU,BIO,SG) [id number] Left 1 Implanted  STEM TIBIAL 10 8-11 EF POLY LT - [id number] Joint STEM TIBIAL 10 8-11 EF POLY LT  ZIMMER RECON(ORTH,TRAU,BIO,SG) [id number] Left 1 Implanted      PREOPERATIVE INDICATIONS:  Terry Guerrero is a 62 y.o. year old male with end stage bone on bone degenerative arthritis of the knee who failed conservative treatment, including injections,  antiinflammatories, activity modification, and assistive devices, and had significant impairment of their activities of daily living, and elected for Total Knee Arthroplasty.   The risks, benefits, and alternatives were discussed at length including but not limited to the risks of infection, bleeding, nerve injury, stiffness, blood clots, the need for revision surgery, cardiopulmonary complications, among others, and they were willing to proceed.  OPERATIVE FINDINGS AND UNIQUE ASPECTS OF THE CASE: Absence of ACL and medial meniscus, significant synovitis consistent rheumatoid arthritis, very soft bone consistent with inflammatory arthropathy.  Small avulsion of the medial aspect of patellar tendon, repaired with a juggernaut suture anchor.  Felt had an excellent arthrotomy closure as well.  ESTIMATED BLOOD LOSS: 25cc  OPERATIVE DESCRIPTION:   Once adequate anesthesia was induced, preoperative antibiotics, 2 gm of ancef,1 gm of Tranexamic Acid, and 8 mg of Decadron administered, the patient was positioned supine with a left thigh tourniquet placed.  The left lower extremity was prepped and draped in sterile fashion.  A time-  out was performed identifying the patient, planned procedure, and the appropriate extremity.     The leg was  exsanguinated, tourniquet elevated to 250 mmHg.  A midline incision was  made followed by median parapatellar arthrotomy. Anterior horn of the medial meniscus was released and resected. A medial release was performed, the infrapatellar fat pad was resected with care taken to protect the patellar tendon. The suprapatellar fat was removed to exposed the distal anterior femur. The anterior horn of the lateral meniscus and ACL were released.    Following initial  exposure, I first started with the femur  The femoral  canal was opened with a drill, canal was suctioned to try to prevent fat emboli.  An  intramedullary rod was passed set at 6 degrees valgus, 10 mm. The distal  femur was resected.  Following this resection, the tibia was  subluxated anteriorly.  Using the extramedullary guide, 10 mm of bone was resected off   the proximal lateral tibia.  We confirmed the gap would be  stable medially and laterally with a size 61m spacer block as well as confirmed that the tibial cut was perpendicular in the coronal plane, checking with an alignment rod.    Once this was done, the posterior femoral referencing femoral sizer was placed under to the posterior condyles with 3 degrees of external rotational which was parallel to the transepicondylar axis and perpendicular to [employer] The femur was sized to be a size 8 in the anterior-  posterior dimension. The  anterior, posterior, and  chamfer cuts were made without difficulty nor   notching making certain that I was along the anterior cortex to help  with flexion gap stability. Next a laminar spreader was placed with the knee in flexion and the medial lateral menisci were resected.  5 cc of the Exparel mixture was injected in the medial side of the back of the knee and 3 cc in the lateral side.  1/2 inch curved osteotome was used to resect posterior osteophyte that was then removed with a pituitary rongeur.       At this point, the tibia was sized to be a size F.  The size F tray was  then pinned in position. Trial reduction was now carried with a 8 femur, F tibia, a 10 mm MC insert.  The knee had full extension and was stable to varus valgus stress in extension.  The knee was tight in flexion and PCL was resected.  Attention was next directed to the patella.  Precut  measurement was noted to be 24 mm.  I resected down to 14 mm and used a  338mpatellar button to restore patellar height as well as cover the cut surface.     The patella lug holes were drilled and a 35 mm patella poly trial was placed.    The knee was brought to full extension with good flexion stability with the patella tracking through the trochlea  without application of pressure.     Next the femoral component was again assessed and determined to be seated and appropriately lateralized.  The femoral lug holes were drilled.  The femoral component was then removed. Tibial component was again assessed and felt to be seated and appropriately rotated with the medial third of the tubercle.  There was a small central defect from his old ACL reconstruction in the tibia.  The tibial screw was visualized and able to be removed through the proximal tibial hole.  The tibia was then drilled, and keel punched.  Elected for tibial stem extension given the defect from this ACL screw as well as because of the relative soft bone.   Final components were  opened and antibiotic cement (given hx of prior ACL reconstruction) was mixed.      Final implants were then  cemented onto cleaned and dried cut surfaces of bone with the knee brought to extension with a 10 mm MC poly.  The knee was irrigated with sterile Betadine diluted in saline as well as pulse lavage normal saline.  The synovial lining was  then injected  a dilute Exparel.      Once the cement had fully cured, excess cement was removed throughout the knee.  I confirmed that I was satisfied with the range of motion and stability, and the final 10 mm MC poly insert was chosen.  It was placed into the knee.  Had difficulty inserting the poly which cannot lock interlock mechanism of the tibial baseplate.  After couple of attempts felt the polyethylene insert had deformed.  Elected to open a new polyethylene liner.  Make sure tibial baseplate was thoroughly cleaned and was able to insert the polyethylene liner and engage the locking mechanism without difficulty.        The tourniquet had been let down.  No significant hemostasis was required.  The medial aspect of the patellar tendon was repaired with a juggernaut double loaded #2 suture anchor.  The suture was passed through the patellar tendon and a Krakw  fashion.  The medial parapatellar arthrotomy was then reapproximated using and #1 Stratafix sutures with the knee  in flexion.  The remaining wound was closed with 0 stratafix, 2-0 Vicryl, and running 3-0 Monocryl. The knee was cleaned, dried, dressed sterilely using Dermabond and   Aquacel dressing.  The patient was then brought to recovery room in stable condition, tolerating the procedure  well. There were no complications.   Post op recs: WB: WBAT Abx: ancef, extended antibiotics with cefadroxil 500 twice daily, given the patient is at an increased preoperative risk of infection given inflammatory arthropathy, prior knee surgical hx, as well as history of smoking. Imaging: PACU xrays DVT prophylaxis: Aspirin '81mg'$  BID x4 weeks Follow up: 2 weeks after surgery for a wound check with Dr. Zachery Dakins at [facility].  Address: [address], [place], [place] [address]  Office Phone: [phone]  Charlies Constable, MD Orthopaedic Surgery

## 2022-05-02 NOTE — Progress Notes (Signed)
Orthopedic Tech Progress Note Patient Details:  Terry Guerrero 08/27/1960 [id number]  Patient ID: Terry Guerrero, male   DOB: [birth year], 62 y.o.   MRN: [id number]  [name] 05/02/2022, 11:59 AM Bone foam applied in pacu

## 2022-05-02 NOTE — Interval H&P Note (Signed)

## 2022-05-02 NOTE — Anesthesia Procedure Notes (Signed)
Spinal  Patient location during procedure: OR Start time: 05/02/2022 8:31 AM End time: 05/02/2022 8:34 AM Reason for block: surgical anesthesia Staffing Performed: resident/CRNA  Resident/CRNA: [place] Lashika Erker C, CRNA Performed by: [place] Elsye Mccollister C, CRNA Authorized by: Janeece Riggers, MD   Preanesthetic Checklist Completed: patient identified, IV checked, site marked, risks and benefits discussed, surgical consent, monitors and equipment checked, pre-op evaluation and timeout performed Spinal Block Patient position: sitting Prep: DuraPrep and site prepped and draped Patient monitoring: heart rate, cardiac monitor, continuous pulse ox and blood pressure Approach: midline Location: L3-4 Injection technique: single-shot Needle Needle type: Pencan  Needle gauge: 24 G Needle length: 9 cm Assessment Sensory level: T4 Events: CSF return Additional Notes IV functioning, monitors applied to pt. Expiration date of kit checked and confirmed to be in date. Sterile prep and drape, hand hygiene and sterile gloved used. Pt was positioned and spine was prepped in sterile fashion. Skin was anesthetized with lidocaine. Free flow of clear CSF obtained prior to injecting local anesthetic into CSF x 1 attempt. Spinal needle aspirated freely following injection. Needle was carefully withdrawn, and pt tolerated procedure well. Loss of motor and sensory on exam post injection.

## 2022-05-02 NOTE — Anesthesia Procedure Notes (Signed)
Anesthesia Regional Block: Adductor canal block   Pre-Anesthetic Checklist: , timeout performed,  Correct Patient, Correct Site, Correct Laterality,  Correct Procedure, Correct Position, site marked,  Risks and benefits discussed,  Surgical consent,  Pre-op evaluation,  At surgeon's request and post-op pain management  Laterality: Left  Prep: chloraprep       Needles:  Injection technique: Single-shot  Needle Type: Echogenic Stimulator Needle     Needle Length: 5cm  Needle Gauge: 22     Additional Needles:   Procedures:,,,, ultrasound used (permanent image in chart),,    Narrative:  Start time: 05/02/2022 7:45 AM End time: 05/02/2022 7:50 AM Injection made incrementally with aspirations every 5 mL.  Performed by: Personally  Anesthesiologist: Janeece Riggers, MD  Additional Notes: Functioning IV was confirmed and monitors were applied.  A 109m 22ga Arrow echogenic stimulator needle was used. Sterile prep and drape,hand hygiene and sterile gloves were used. Ultrasound guidance: relevant anatomy identified, needle position confirmed, local anesthetic spread visualized around nerve(s)., vascular puncture avoided.  Image printed for medical record. Negative aspiration and negative test dose prior to incremental administration of local anesthetic. The patient tolerated the procedure well.

## 2022-05-02 NOTE — Evaluation (Signed)
Physical Therapy Evaluation Patient Details Name: Terry Guerrero MRN: [id number] DOB: 01-30-1961 Today's Date: 05/02/2022  History of Present Illness  Pt is a 62yo male presenting s/p L-TKA on 05/02/22. PMH: RA, UC, seizures, L-ACL repair.  Clinical Impression  Terry Guerrero is a 62 y.o. male POD 0 s/p L-TKA. Patient reports modified independence using crutches intermittently with mobility at baseline. Patient is now limited by functional impairments (see PT problem list below) and requires min guard for transfers and gait with RW. Patient was able to ambulate 80 feet with crutches and supervision with cues for safe AD management. Patient educated on safe sequencing for stair mobility and verbalized safe guarding position for people assisting with mobility. Patient instructed in exercises to facilitate ROM and circulation. Patient will benefit from continued skilled PT interventions to address impairments and progress towards PLOF. Patient has met mobility goals at adequate level for discharge home; will continue to follow if pt continues acute stay to progress towards Mod I goals.        Recommendations for follow up therapy are one component of a multi-disciplinary discharge planning process, led by the attending physician.  Recommendations may be updated based on patient status, additional functional criteria and insurance authorization.  Follow Up Recommendations Follow physician's recommendations for discharge plan and follow up therapies      Assistance Recommended at Discharge Intermittent Supervision/Assistance  Patient can return home with the following  A little help with walking and/or transfers;A little help with bathing/dressing/bathroom;Assistance with cooking/housework;Assist for transportation;Help with stairs or ramp for entrance    Equipment Recommendations None recommended by PT  Recommendations for Other Services       Functional Status Assessment Patient has had a recent  decline in their functional status and demonstrates the ability to make significant improvements in function in a reasonable and predictable amount of time.     Precautions / Restrictions Precautions Precautions: Knee Precaution Booklet Issued: No Precaution Comments: no pillow Restrictions Weight Bearing Restrictions: No Other Position/Activity Restrictions: wbat      Mobility  Bed Mobility Overal bed mobility: Needs Assistance Bed Mobility: Supine to Sit     Supine to sit: Supervision     General bed mobility comments: For safety    Transfers Overall transfer level: Needs assistance Equipment used: Crutches Transfers: Sit to/from Stand Sit to Stand: Min guard, From elevated surface           General transfer comment: Pt min guard for safety    Ambulation/Gait Ambulation/Gait assistance: Supervision Gait Distance (Feet): 60 Feet Assistive device: Crutches Gait Pattern/deviations: WFL(Within Functional Limits) Gait velocity: decreased     General Gait Details: Pt ambulated with crutches and supervision, no physical assist required or overt LOB noted, three point gait pattern  Stairs Stairs: Yes Stairs assistance: Supervision Stair Management: No rails, Step to pattern, Backwards, With crutches Number of Stairs: 2 General stair comments: Pt completed stair training with supervision, no physical assist required or overt LOB noted, Vcs for sequencing  Wheelchair Mobility    Modified Rankin (Stroke Patients Only)       Balance Overall balance assessment: Needs assistance Sitting-balance support: No upper extremity supported, Feet supported Sitting balance-Leahy Scale: Good     Standing balance support: Reliant on assistive device for balance, During functional activity, Bilateral upper extremity supported Standing balance-Leahy Scale: Poor  Pertinent Vitals/Pain Pain Assessment Pain Assessment: 0-10 Pain  Score: 2  Pain Location: Left knee, bladder Pain Descriptors / Indicators: Operative site guarding Pain Intervention(s): Limited activity within patient's tolerance, Monitored during session, Repositioned, Ice applied    Home Living Family/patient expects to be discharged to:: Private residence Living Arrangements: Spouse/significant other Available Help at Discharge: Friend(s);Available 24 hours/day Type of Home: House Home Access: Stairs to enter Entrance Stairs-Rails: Right;Left;Can reach both Entrance Stairs-Number of Steps: 3 (9 on the font, 3 on the back)   Home Layout: One level Home Equipment: Crutches      Prior Function Prior Level of Function : Independent/Modified Independent             Mobility Comments: IND ADLs Comments: IND     Hand Dominance        Extremity/Trunk Assessment   Upper Extremity Assessment Upper Extremity Assessment: Overall WFL for tasks assessed;RUE deficits/detail;LUE deficits/detail RUE Deficits / Details: MMT ank DF/PF 5/5 RUE Sensation: WNL LUE Deficits / Details: MMT ank DF/PF 5/5, no extensor lag noted LUE Sensation: WNL    Lower Extremity Assessment Lower Extremity Assessment: RLE deficits/detail;LLE deficits/detail RLE Deficits / Details: MMT ank DF/PF 5/5 RLE Sensation: WNL LLE Deficits / Details: MMT ank DF/PF 5/5, no extensor lag noted LLE Sensation: WNL    Cervical / Trunk Assessment Cervical / Trunk Assessment: Normal  Communication   Communication: No difficulties  Cognition Arousal/Alertness: Awake/alert Behavior During Therapy: WFL for tasks assessed/performed Overall Cognitive Status: Within Functional Limits for tasks assessed                                          General Comments      Exercises Total Joint Exercises Ankle Circles/Pumps: AROM, Both, 20 reps Quad Sets: AROM, Left, Other reps (comment) (2) Short Arc Quad: AROM, Right, Other reps (comment) (2) Heel Slides: AROM,  Right, Other reps (comment) (2) Hip ABduction/ADduction: AROM, Right, 5 reps Straight Leg Raises: AROM, Right, 5 reps Goniometric ROM: -5-40deg by gross visual approximation   Assessment/Plan    PT Assessment Patient needs continued PT services  PT Problem List Decreased strength;Decreased range of motion;Decreased activity tolerance;Decreased balance;Decreased mobility;Decreased coordination;Pain       PT Treatment Interventions DME instruction;Gait training;Stair training;Functional mobility training;Therapeutic activities;Therapeutic exercise;Balance training;Neuromuscular re-education;Patient/family education    PT Goals (Current goals can be found in the Care Plan section)  Acute Rehab PT Goals Patient Stated Goal: walking PT Goal Formulation: With patient Time For Goal Achievement: 05/09/22 Potential to Achieve Goals: Good    Frequency 7X/week     Co-evaluation               AM-PAC PT "6 Clicks" Mobility  Outcome Measure Help needed turning from your back to your side while in a flat bed without using bedrails?: None Help needed moving from lying on your back to sitting on the side of a flat bed without using bedrails?: A Little Help needed moving to and from a bed to a chair (including a wheelchair)?: A Little Help needed standing up from a chair using your arms (e.g., wheelchair or bedside chair)?: A Little Help needed to walk in hospital room?: A Little Help needed climbing 3-5 steps with a railing? : A Little 6 Click Score: 19    End of Session Equipment Utilized During Treatment: Gait belt Activity Tolerance: Patient tolerated treatment well;No increased  pain Patient left: in chair;with call bell/phone within reach Nurse Communication: Mobility status PT Visit Diagnosis: Pain;Difficulty in walking, not elsewhere classified (R26.2) Pain - Right/Left: Left Pain - part of body: Knee    Time: 2417-5301 PT Time Calculation (min) (ACUTE ONLY): 26  min   Charges:   PT Evaluation $PT Eval Low Complexity: 1 Low PT Treatments $Gait Training: 8-22 mins        Coolidge Breeze, PT, DPT [facility] Department Office: [phone] Weekend pager: [phone]  Coolidge Breeze 05/02/2022, 3:17 PM

## 2022-05-03 ENCOUNTER — Encounter (HOSPITAL_COMMUNITY): Payer: Self-pay | Admitting: Orthopedic Surgery

## 2022-05-09 DIAGNOSIS — M6281 Muscle weakness (generalized): Secondary | ICD-10-CM | POA: Diagnosis not present

## 2022-05-09 DIAGNOSIS — M25662 Stiffness of left knee, not elsewhere classified: Secondary | ICD-10-CM | POA: Diagnosis not present

## 2022-05-09 DIAGNOSIS — M1712 Unilateral primary osteoarthritis, left knee: Secondary | ICD-10-CM | POA: Diagnosis not present

## 2022-05-09 DIAGNOSIS — R269 Unspecified abnormalities of gait and mobility: Secondary | ICD-10-CM | POA: Diagnosis not present

## 2022-05-15 NOTE — Progress Notes (Signed)
Office Visit Note  Patient: Terry Guerrero             Date of Birth: 02-Jun-1960           MRN: [id number]             PCP: Susy Frizzle, MD Referring: Susy Frizzle, MD Visit Date: 05/29/2022 Occupation: '@GUAROCC'$ @  Subjective:  Joint stiffness  History of Present Illness: Terry Guerrero is a 62 y.o. male with history of seronegative rheumatoid arthritis, osteoarthritis and degenerative disc disease. Patient states that he fell in November 2023 and landed on his left knee.  He states he had multiple injuries to his left knee joint.  As he had ongoing pain and discomfort he was evaluated by orthopedics.  Because he had end-stage osteoarthritis they decided to proceed with left total knee replacement.  Patient had left total knee replacement on May 03, 2021.  He has been going to physical therapy and gradually recovering from it.  He had to miss 2 doses of Humira while he had knee replacement.  Humira injection last Thursday.  He had no flare while he was off Humira.  None of the joints are painful or swollen per patient.  He has been off sulfasalazine.    Activities of Daily Living:  Patient reports morning stiffness for 20 minutes.   Patient Denies nocturnal pain.  Difficulty dressing/grooming: Denies Difficulty climbing stairs: Reports Difficulty getting out of chair: Denies Difficulty using hands for taps, buttons, cutlery, and/or writing: Denies  Review of Systems  Constitutional: Negative.  Negative for fatigue.  HENT: Negative.  Negative for mouth sores and mouth dryness.   Eyes: Negative.  Negative for dryness.  Respiratory: Negative.  Negative for shortness of breath.   Cardiovascular: Negative.  Negative for chest pain and palpitations.  Gastrointestinal: Negative.  Negative for blood in stool, constipation and diarrhea.  Endocrine: Negative.  Negative for increased urination.  Genitourinary: Negative.  Negative for involuntary urination.  Musculoskeletal:   Positive for joint pain, joint pain, morning stiffness and muscle tenderness. Negative for gait problem, joint swelling, myalgias, muscle weakness and myalgias.  Skin: Negative.  Negative for pallor, rash, hair loss and sensitivity to sunlight.  Allergic/Immunologic: Negative.  Negative for susceptible to infections.  Neurological: Negative.  Negative for dizziness and headaches.  Hematological: Negative.  Negative for swollen glands.  Psychiatric/Behavioral: Negative.  Negative for depressed mood and sleep disturbance. The patient is not nervous/anxious.     PMFS History:  Patient Active Problem List   Diagnosis Date Noted   Ulcerative pancolitis without complication ([place]) 98/92/1194   Syncope and collapse 04/12/2021   Rectal bleeding 05/07/2019   Strain of left pectoralis muscle 02/13/2017   Rheumatoid arthritis ([place])     Past Medical History:  Diagnosis Date   Arthritis    Rheumatoid arthritis(714.0)    Seizures ([place]) 12/2020   Ulcerative colitis ([place])     Family History  Problem Relation Age of Onset   Alcoholism Mother    Colon cancer Neg Hx    Esophageal cancer Neg Hx    Stomach cancer Neg Hx    Rectal cancer Neg Hx    Past Surgical History:  Procedure Laterality Date   ANTERIOR CRUCIATE LIGAMENT REPAIR     Left knee   COLONOSCOPY     HERNIA REPAIR  1740   umbilical   TOTAL KNEE ARTHROPLASTY Left 05/02/2022   Procedure: TOTAL KNEE ARTHROPLASTY;  Surgeon: Willaim Sheng, MD;  Location:  [facility];  Service: Orthopedics;  Laterality: Left;   Social History   Social History Narrative   Daily caffeine    Right handed    Lives with girlfriend. One story home   1 son.     There is no immunization history on file for this patient.   Objective: Vital Signs: BP 128/77 (BP Location: Left Arm, Patient Position: Sitting, Cuff Size: Normal)   Pulse 75   Ht '5\' 6"'$  (1.676 m)   Wt 142 lb 12.8 oz (64.8 kg)   BMI 23.05 kg/m    Physical Exam Vitals and nursing note  reviewed.  Constitutional:      Appearance: He is well-developed.  HENT:     Head: Normocephalic and atraumatic.  Eyes:     Conjunctiva/sclera: Conjunctivae normal.     Pupils: Pupils are equal, round, and reactive to light.  Cardiovascular:     Rate and Rhythm: Normal rate and regular rhythm.     Heart sounds: Normal heart sounds.  Pulmonary:     Effort: Pulmonary effort is normal.     Breath sounds: Normal breath sounds.  Abdominal:     General: Bowel sounds are normal.     Palpations: Abdomen is soft.  Musculoskeletal:     Cervical back: Normal range of motion and neck supple.  Skin:    General: Skin is warm and dry.     Capillary Refill: Capillary refill takes less than 2 seconds.  Neurological:     Mental Status: He is alert and oriented to person, place, and time.  Psychiatric:        Behavior: Behavior normal.      Musculoskeletal Exam: Good range of motion of the cervical spine.  Shoulders were in good range of motion.  He had contracture in his right elbow.  He had almost no range of motion in his wrist joints which are fused.  He had bilateral MCP thickening.  He had bilateral PIP and DIP thickening with incomplete fist formation bilaterally.  Hip joints were in good range of motion.  Left knee joint was replaced with limited flexion and warmth on palpation.  Right knee joint was in good range of motion.  There was no tenderness over ankles or MTPs.  CDAI Exam: CDAI Score: -- Patient Global: 3 mm; Provider Global: 3 mm Swollen: --; Tender: -- Joint Exam 05/29/2022   No joint exam has been documented for this visit   There is currently no information documented on the homunculus. Go to the Rheumatology activity and complete the homunculus joint exam.  Investigation: No additional findings.  Imaging: DG Knee Left Port  Result Date: 05/02/2022 CLINICAL DATA:  Postoperative state.  Knee replacement. EXAM: PORTABLE LEFT KNEE - 1-2 VIEW COMPARISON:  Left knee  radiographs 07/02/2021 FINDINGS: Interval total left knee arthroplasty. Unchanged screw within the distal femoral metaphysis, portion of the prior ACL reconstruction surgery. The proximal tibial screw has been removed. No perihardware lucency is seen to indicate hardware failure or loosening. Expected postoperative changes including intra-articular and subcutaneous air. Small joint effusion. No acute fracture or dislocation. IMPRESSION: Interval total left knee arthroplasty without evidence of hardware failure. Electronically Signed   By: Yvonne Kendall M.D.   On: 05/02/2022 11:55    Recent Labs: Lab Results  Component Value Date   WBC 9.1 04/20/2022   HGB 15.0 04/20/2022   PLT 316 04/20/2022   NA 139 04/20/2022   K 4.9 04/20/2022   CL 106 04/20/2022   CO2  27 04/20/2022   GLUCOSE 104 (H) 04/20/2022   BUN 10 04/20/2022   CREATININE 0.79 04/20/2022   BILITOT 0.5 04/20/2022   ALKPHOS 71 04/20/2022   AST 20 04/20/2022   ALT 14 04/20/2022   PROT 8.2 (H) 04/20/2022   ALBUMIN 4.3 04/20/2022   CALCIUM 9.4 04/20/2022   GFRAA 94 05/04/2019   QFTBGOLDPLUS NEGATIVE 09/15/2021    Speciality Comments: Enbrel by Dr. Rockwell [place] (discontinued due to loss of insurance), Humira started 11/01/21  Procedures:  No procedures performed Allergies: Gold-containing drug products, Lamisil [terbinafine], Naprosyn [naproxen], and Oxycodone   Assessment / Plan:     Visit Diagnoses: Rheumatoid arthritis of multiple sites with negative rheumatoid factor ([place]) - RF negative, anti-CCP negative, elevated sedimentation rate, severe erosive RA: dxd in his 30s. Ttd at [facility] later by Dr. Rockwell [place] with Enbrel: He has been doing well on Humira 40 mg subcu every other week.  He had to come off Humira for the left knee replacement.  He denies having any flares while he was off Humira.  He resumed Humira last week.  He has been off sulfasalazine since his last visit.  He had no synovitis on the examination today.  High risk  medication use - Humira 40 mg sq injections every 14 days.  He discontinued sulfasalazine after the last visit.  Labs obtained on April 20, 2022 CBC and CMP were normal.  He was advised to have repeat labs in March and then every 3 months.  TB Gold was negative in May 2023.  He will have repeat TB Gold annually.  Information on immunization was placed in the AVS.  He was advised to hold Humira if he develops an infection resume after the infection resolves.  Annual skin examination to screen for skin cancer was advised.  Use of sunscreen was advised.  Pain in both hands -he has severe rheumatoid arthritis and osteoarthritis overlap with incomplete fist formation.  Clinical and radiographic findings were consistent with osteoarthritis and rheumatoid arthritis.  Status post total left knee replacement - 05/02/22.  Patient had left total knee replacement due to severe osteoarthritis.  He also had a fall in November 2023.  Pain in both feet -he continues to have some discomfort in his feet.  He had no tenderness on the examination today.  X-rays were consistent with osteoarthritis and rheumatoid arthritis overlap.  DDD (degenerative disc disease), lumbar-he has intermittent discomfort in the lumbar spine.  Other medical problems listed as follows:  Temporal lobe epilepsy [facility]) - New onset seizures started October 2022.  He is on Keppra.  Followed by Dr. Delice Lesch.  History of ulcerative colitis - He has been followed by Dr. Fuller Plan.  Syncope and collapse - Cardiology work-up was negative.  Smoker - Half pack per day for 30 years.  Orders: No orders of the defined types were placed in this encounter.  No orders of the defined types were placed in this encounter.    Follow-Up Instructions: Return in about 5 months (around 10/27/2022) for Rheumatoid arthritis, Osteoarthritis.   Bo Merino, MD  Note - This record has been created using Editor, commissioning.  Chart creation errors have been  sought, but may not always  have been located. Such creation errors do not reflect on  the standard of medical care.

## 2022-05-16 NOTE — Progress Notes (Unsigned)
Subjective:   Terry Guerrero is a 62 y.o. male who presents for Medicare Annual/Subsequent preventive examination.  Review of Systems    ***       Objective:    There were no vitals filed for this visit. There is no height or weight on file to calculate BMI.     04/20/2022   11:02 AM 03/02/2022    3:58 PM 12/22/2021    2:50 PM 11/29/2021    8:04 AM 11/06/2021    3:34 PM 07/02/2021   12:01 PM 05/12/2021    9:07 AM  Advanced Directives  Does Patient Have a Medical Advance Directive? No No No No No No No  Would patient like information on creating a medical advance directive?    No - Patient declined   No - Patient declined    Current Medications (verified) Outpatient Encounter Medications as of 05/17/2022  Medication Sig   acetaminophen (TYLENOL) 500 MG tablet Take 2 tablets (1,000 mg total) by mouth every 8 (eight) hours as needed.   Adalimumab (HUMIRA, 2 PEN,) 40 MG/0.4ML PNKT Inject 40 mg into the skin every 14 (fourteen) days. Okay to resume 2 weeks after surgery   aspirin EC 81 MG tablet Take 1 tablet (81 mg total) by mouth 2 (two) times daily for 28 days. Swallow whole.   ondansetron (ZOFRAN) 4 MG tablet Take 1 tablet (4 mg total) by mouth every 8 (eight) hours as needed for up to 14 days for nausea or vomiting.   Oxcarbazepine (TRILEPTAL) 300 MG tablet Take 2 tablets twice a day   No facility-administered encounter medications on file as of 05/17/2022.    Allergies (verified) Gold-containing drug products, Lamisil [terbinafine], Naprosyn [naproxen], and Oxycodone   History: Past Medical History:  Diagnosis Date   Arthritis    Rheumatoid arthritis(714.0)    Seizures ([place]) 12/2020   Ulcerative colitis ([place])    Past Surgical History:  Procedure Laterality Date   ANTERIOR CRUCIATE LIGAMENT REPAIR     Left knee   COLONOSCOPY     HERNIA REPAIR  9563   umbilical   TOTAL KNEE ARTHROPLASTY Left 05/02/2022   Procedure: TOTAL KNEE ARTHROPLASTY;  Surgeon: Willaim Sheng, MD;  Location: [facility];  Service: Orthopedics;  Laterality: Left;   Family History  Problem Relation Age of Onset   Alcoholism Mother    Colon cancer Neg Hx    Esophageal cancer Neg Hx    Stomach cancer Neg Hx    Rectal cancer Neg Hx    Social History   Socioeconomic History   Marital status: Divorced    Spouse name: Not on file   Number of children: 1   Years of education: Not on file   Highest education level: Not on file  Occupational History   Occupation: Disabled   Tobacco Use   Smoking status: Former    Packs/day: 1.00    Years: 25.00    Total pack years: 25.00    Types: Cigarettes    Quit date: 03/23/2022    Years since quitting: 0.1   Smokeless tobacco: Former  Scientific laboratory technician Use: Never used  Substance and Sexual Activity   Alcohol use: Not Currently   Drug use: Yes    Types: Marijuana    Comment: marijuana daily   Sexual activity: Yes    Birth control/protection: None  Other Topics Concern   Not on file  Social History Narrative   Daily caffeine    Right  handed    Lives with girlfriend. One story home   1 son.    Social Determinants of Health   Financial Resource Strain: Low Risk  (05/12/2021)   Overall Financial Resource Strain (CARDIA)    Difficulty of Paying Living Expenses: Not very hard  Food Insecurity: No Food Insecurity (05/12/2021)   Hunger Vital Sign    Worried About Running Out of Food in the Last Year: Never true    Ran Out of Food in the Last Year: Never true  Transportation Needs: No Transportation Needs (05/12/2021)   PRAPARE - Hydrologist (Medical): No    Lack of Transportation (Non-Medical): No  Physical Activity: Sufficiently Active (05/12/2021)   Exercise Vital Sign    Days of Exercise per Week: 5 days    Minutes of Exercise per Session: 30 min  Stress: No Stress Concern Present (05/12/2021)   [place]    Feeling of  Stress : Not at all  Social Connections: Moderately Isolated (05/12/2021)   Social Connection and Isolation Panel [NHANES]    Frequency of Communication with Friends and Family: More than three times a week    Frequency of Social Gatherings with Friends and Family: More than three times a week    Attends Religious Services: Never    Marine scientist or Organizations: No    Attends Archivist Meetings: Never    Marital Status: Living with partner    Tobacco Counseling Counseling given: Not Answered   Clinical Intake:              How often do you need to have someone help you when you read instructions, pamphlets, or other written materials from your doctor or pharmacy?: (P) 1 - Never  Diabetic?No          Activities of Daily Living    05/16/2022    5:38 PM 04/20/2022   11:03 AM  In your present state of health, do you have any difficulty performing the following activities:  Hearing? 0   Vision? 0   Difficulty concentrating or making decisions? 0   Walking or climbing stairs? 1   Dressing or bathing? 0   Doing errands, shopping? 1 0  Preparing Food and eating ? N   Using the Toilet? N   In the past six months, have you accidently leaked urine? N   Do you have problems with loss of bowel control? N   Managing your Medications? N   Managing your Finances? N   Housekeeping or managing your Housekeeping? N     Patient Care Team: Susy Frizzle, MD as PCP - General (Family Medicine) Cameron Sprang, MD as Consulting Physician (Neurology)  Indicate any recent Medical Services you may have received from other than [facility] providers in the past year (date may be approximate).     Assessment:   This is a routine wellness examination for [name].  Hearing/Vision screen No results found.  Dietary issues and exercise activities discussed:     Goals Addressed   None    Depression Screen    05/12/2021    9:03 AM 01/03/2021   12:18 PM  07/19/2017    9:01 AM  PHQ 2/9 Scores  PHQ - 2 Score 0 0 0    Fall Risk    05/16/2022    5:38 PM 03/02/2022    3:58 PM 12/22/2021    2:50 PM 11/06/2021  3:33 PM 05/12/2021    9:07 AM  Fall Risk   Falls in the past year? 1 0 '1 1 1  '$ Number falls in past yr: 0 0 0 1 1  Injury with Fall? 0 0 1 1 0  Risk for fall due to :     History of fall(s);Impaired balance/gait;Impaired mobility  Follow up  Falls evaluation completed   Falls prevention discussed    FALL RISK PREVENTION PERTAINING TO THE HOME:  Any stairs in or around the home? {YES/NO:21197} If so, are there any without handrails? {YES/NO:21197} Home free of loose throw rugs in walkways, pet beds, electrical cords, etc? {YES/NO:21197} Adequate lighting in your home to reduce risk of falls? {YES/NO:21197}  ASSISTIVE DEVICES UTILIZED TO PREVENT FALLS:  Life alert? {YES/NO:21197} Use of a cane, walker or w/c? {YES/NO:21197} Grab bars in the bathroom? {YES/NO:21197} Shower chair or bench in shower? {YES/NO:21197} Elevated toilet seat or a handicapped toilet? {YES/NO:21197}  TIMED UP AND GO:  Was the test performed? No . Telephonic visit   Cognitive Function:        05/12/2021    9:10 AM  6CIT Screen  What Year? 0 points  What month? 0 points  What time? 0 points  Count back from 20 0 points  Months in reverse 2 points  Repeat phrase 0 points  Total Score 2 points    Immunizations  There is no immunization history on file for this patient.  TDAP status: Due, Education has been provided regarding the importance of this vaccine. Advised may receive this vaccine at local pharmacy or Health Dept. Aware to provide a copy of the vaccination record if obtained from local pharmacy or Health Dept. Verbalized acceptance and understanding.  Flu Vaccine status: Declined, Education has been provided regarding the importance of this vaccine but patient still declined. Advised may receive this vaccine at local pharmacy or Health  Dept. Aware to provide a copy of the vaccination record if obtained from local pharmacy or Health Dept. Verbalized acceptance and understanding.  Pneumococcal vaccine status: Declined,  Education has been provided regarding the importance of this vaccine but patient still declined. Advised may receive this vaccine at local pharmacy or Health Dept. Aware to provide a copy of the vaccination record if obtained from local pharmacy or Health Dept. Verbalized acceptance and understanding.   Covid-19 vaccine status: Declined, Education has been provided regarding the importance of this vaccine but patient still declined. Advised may receive this vaccine at local pharmacy or Health Dept.or vaccine clinic. Aware to provide a copy of the vaccination record if obtained from local pharmacy or Health Dept. Verbalized acceptance and understanding.  Qualifies for Shingles Vaccine? Yes   Zostavax completed No   Shingrix Completed?: No.    Education has been provided regarding the importance of this vaccine. Patient has been advised to call insurance company to determine out of pocket expense if they have not yet received this vaccine. Advised may also receive vaccine at local pharmacy or Health Dept. Verbalized acceptance and understanding.  Screening Tests Health Maintenance  Topic Date Due   DTaP/Tdap/Td (1 - Tdap) Never done   Lung Cancer Screening  Never done   Medicare Annual Wellness (AWV)  05/12/2022   COLONOSCOPY (Pts 45-58yr Insurance coverage will need to be confirmed)  05/13/2024   Hepatitis C Screening  Completed   HPV VACCINES  Aged Out   INFLUENZA VACCINE  Discontinued   COVID-19 Vaccine  Discontinued   HIV Screening  Discontinued  Zoster Vaccines- Shingrix  Discontinued    Health Maintenance  Health Maintenance Due  Topic Date Due   DTaP/Tdap/Td (1 - Tdap) Never done   Lung Cancer Screening  Never done   Medicare Annual Wellness (AWV)  05/12/2022    Colorectal cancer screening:  Type of screening: Colonoscopy. Completed 05/14/19. Repeat every 5 years  Lung Cancer Screening: (Low Dose CT Chest recommended if Age 61-80 years, 30 pack-year currently smoking OR have quit w/in 15years.) does qualify.   Lung Cancer Screening Referral: ***  Additional Screening:  Hepatitis C Screening: does qualify; Completed 09/15/21  Vision Screening: Recommended annual ophthalmology exams for early detection of glaucoma and other disorders of the eye. Is the patient up to date with their annual eye exam?  {YES/NO:21197} Who is the provider or what is the name of the office in which the patient attends annual eye exams? *** If pt is not established with a provider, would they like to be referred to a provider to establish care? {YES/NO:21197}.   Dental Screening: Recommended annual dental exams for proper oral hygiene  Community Resource Referral / Chronic Care Management: CRR required this visit?  {YES/NO:21197}  CCM required this visit?  {YES/NO:21197}     Plan:     I have personally reviewed and noted the following in the patient's chart:   Medical and social history Use of alcohol, tobacco or illicit drugs  Current medications and supplements including opioid prescriptions. {Opioid Prescriptions:6010133180} Functional ability and status Nutritional status Physical activity Advanced directives List of other physicians Hospitalizations, surgeries, and ER visits in previous 12 months Vitals Screenings to include cognitive, depression, and falls Referrals and appointments  In addition, I have reviewed and discussed with patient certain preventive protocols, quality metrics, and best practice recommendations. A written personalized care plan for preventive services as well as general preventive health recommendations were provided to patient.     Vanetta Mulders, [place]   12/03/7515   Due to this being a virtual visit, the after visit summary with patients  personalized plan was offered to patient via mail or my-chart. ***Patient declined at this time./ Patient would like to access on my-chart/ per request, patient was mailed a copy of AVS./ Patient preferred to pick up at office at next visit   Nurse Notes: ***

## 2022-05-16 NOTE — Patient Instructions (Signed)
Terry Guerrero , Thank you for taking time to come for your Medicare Wellness Visit. I appreciate your ongoing commitment to your health goals. Please review the following plan we discussed and let me know if I can assist you in the future.   These are the goals we discussed:  Goals   None     This is a list of the screening recommended for you and due dates:  Health Maintenance  Topic Date Due   DTaP/Tdap/Td vaccine (1 - Tdap) Never done   Screening for Lung Cancer  Never done   Medicare Annual Wellness Visit  05/12/2022   Colon Cancer Screening  05/13/2024   Hepatitis C Screening: USPSTF Recommendation to screen - Ages 18-79 yo.  Completed   HPV Vaccine  Aged Out   Flu Shot  Discontinued   COVID-19 Vaccine  Discontinued   HIV Screening  Discontinued   Zoster (Shingles) Vaccine  Discontinued    Advanced directives: ***  Conditions/risks identified: Aim for 30 minutes of exercise or brisk walking, 6-8 glasses of water, and 5 servings of fruits and vegetables each day.   Next appointment: Follow up in one year for your annual wellness visit   Preventive Care 40-64 Years, Male Preventive care refers to lifestyle choices and visits with your health care provider that can promote health and wellness. What does preventive care include? A yearly physical exam. This is also called an annual well check. Dental exams once or twice a year. Routine eye exams. Ask your health care provider how often you should have your eyes checked. Personal lifestyle choices, including: Daily care of your teeth and gums. Regular physical activity. Eating a healthy diet. Avoiding tobacco and drug use. Limiting alcohol use. Practicing safe sex. Taking low-dose aspirin every day starting at age 22. What happens during an annual well check? The services and screenings done by your health care provider during your annual well check will depend on your age, overall health, lifestyle risk factors, and family  history of disease. Counseling  Your health care provider may ask you questions about your: Alcohol use. Tobacco use. Drug use. Emotional well-being. Home and relationship well-being. Sexual activity. Eating habits. Work and work Statistician. Screening  You may have the following tests or measurements: Height, weight, and BMI. Blood pressure. Lipid and cholesterol levels. These may be checked every 5 years, or more frequently if you are over 63 years old. Skin check. Lung cancer screening. You may have this screening every year starting at age 50 if you have a 30-pack-year history of smoking and currently smoke or have quit within the past 15 years. Fecal occult blood test (FOBT) of the stool. You may have this test every year starting at age 32. Flexible sigmoidoscopy or colonoscopy. You may have a sigmoidoscopy every 5 years or a colonoscopy every 10 years starting at age 52. Prostate cancer screening. Recommendations will vary depending on your family history and other risks. Hepatitis C blood test. Hepatitis B blood test. Sexually transmitted disease (STD) testing. Diabetes screening. This is done by checking your blood sugar (glucose) after you have not eaten for a while (fasting). You may have this done every 1-3 years. Discuss your test results, treatment options, and if necessary, the need for more tests with your health care provider. Vaccines  Your health care provider may recommend certain vaccines, such as: Influenza vaccine. This is recommended every year. Tetanus, diphtheria, and acellular pertussis (Tdap, Td) vaccine. You may need a Td booster every  10 years. Zoster vaccine. You may need this after age 49. Pneumococcal 13-valent conjugate (PCV13) vaccine. You may need this if you have certain conditions and have not been vaccinated. Pneumococcal polysaccharide (PPSV23) vaccine. You may need one or two doses if you smoke cigarettes or if you have certain  conditions. Talk to your health care provider about which screenings and vaccines you need and how often you need them. This information is not intended to replace advice given to you by your health care provider. Make sure you discuss any questions you have with your health care provider. Document Released: 05/06/2015 Document Revised: 12/28/2015 Document Reviewed: 02/08/2015 Elsevier Interactive Patient Education  2017 [place] Prevention in the Home Falls can cause injuries. They can happen to people of all ages. There are many things you can do to make your home safe and to help prevent falls. What can I do on the outside of my home? Regularly fix the edges of walkways and driveways and fix any cracks. Remove anything that might make you trip as you walk through a door, such as a raised step or threshold. Trim any bushes or trees on the path to your home. Use bright outdoor lighting. Clear any walking paths of anything that might make someone trip, such as rocks or tools. Regularly check to see if handrails are loose or broken. Make sure that both sides of any steps have handrails. Any raised decks and porches should have guardrails on the edges. Have any leaves, snow, or ice cleared regularly. Use sand or salt on walking paths during winter. Clean up any spills in your garage right away. This includes oil or grease spills. What can I do in the bathroom? Use night lights. Install grab bars by the toilet and in the tub and shower. Do not use towel bars as grab bars. Use non-skid mats or decals in the tub or shower. If you need to sit down in the shower, use a plastic, non-slip stool. Keep the floor dry. Clean up any water that spills on the floor as soon as it happens. Remove soap buildup in the tub or shower regularly. Attach bath mats securely with double-sided non-slip rug tape. Do not have throw rugs and other things on the floor that can make you trip. What can I do in  the bedroom? Use night lights. Make sure that you have a light by your bed that is easy to reach. Do not use any sheets or blankets that are too big for your bed. They should not hang down onto the floor. Have a firm chair that has side arms. You can use this for support while you get dressed. Do not have throw rugs and other things on the floor that can make you trip. What can I do in the kitchen? Clean up any spills right away. Avoid walking on wet floors. Keep items that you use a lot in easy-to-reach places. If you need to reach something above you, use a strong step stool that has a grab bar. Keep electrical cords out of the way. Do not use floor polish or wax that makes floors slippery. If you must use wax, use non-skid floor wax. Do not have throw rugs and other things on the floor that can make you trip. What can I do with my stairs? Do not leave any items on the stairs. Make sure that there are handrails on both sides of the stairs and use them. Fix handrails that are broken  or loose. Make sure that handrails are as long as the stairways. Check any carpeting to make sure that it is firmly attached to the stairs. Fix any carpet that is loose or worn. Avoid having throw rugs at the top or bottom of the stairs. If you do have throw rugs, attach them to the floor with carpet tape. Make sure that you have a light switch at the top of the stairs and the bottom of the stairs. If you do not have them, ask someone to add them for you. What else can I do to help prevent falls? Wear shoes that: Do not have high heels. Have rubber bottoms. Are comfortable and fit you well. Are closed at the toe. Do not wear sandals. If you use a stepladder: Make sure that it is fully opened. Do not climb a closed stepladder. Make sure that both sides of the stepladder are locked into place. Ask someone to hold it for you, if possible. Clearly mark and make sure that you can see: Any grab bars or  handrails. First and last steps. Where the edge of each step is. Use tools that help you move around (mobility aids) if they are needed. These include: Canes. Walkers. Scooters. Crutches. Turn on the lights when you go into a dark area. Replace any light bulbs as soon as they burn out. Set up your furniture so you have a clear path. Avoid moving your furniture around. If any of your floors are uneven, fix them. If there are any pets around you, be aware of where they are. Review your medicines with your doctor. Some medicines can make you feel dizzy. This can increase your chance of falling. Ask your doctor what other things that you can do to help prevent falls. This information is not intended to replace advice given to you by your health care provider. Make sure you discuss any questions you have with your health care provider. Document Released: 02/03/2009 Document Revised: 09/15/2015 Document Reviewed: 05/14/2014 Elsevier Interactive Patient Education  2017 [employer].

## 2022-05-17 ENCOUNTER — Ambulatory Visit: Payer: Medicare HMO

## 2022-05-17 VITALS — Ht 66.0 in | Wt 142.0 lb

## 2022-05-17 DIAGNOSIS — Z0000 Encounter for general adult medical examination without abnormal findings: Secondary | ICD-10-CM

## 2022-05-17 DIAGNOSIS — M1712 Unilateral primary osteoarthritis, left knee: Secondary | ICD-10-CM | POA: Diagnosis not present

## 2022-05-22 DIAGNOSIS — M6281 Muscle weakness (generalized): Secondary | ICD-10-CM | POA: Diagnosis not present

## 2022-05-22 DIAGNOSIS — M25662 Stiffness of left knee, not elsewhere classified: Secondary | ICD-10-CM | POA: Diagnosis not present

## 2022-05-22 DIAGNOSIS — M1712 Unilateral primary osteoarthritis, left knee: Secondary | ICD-10-CM | POA: Diagnosis not present

## 2022-05-22 DIAGNOSIS — R269 Unspecified abnormalities of gait and mobility: Secondary | ICD-10-CM | POA: Diagnosis not present

## 2022-05-29 ENCOUNTER — Ambulatory Visit: Payer: Medicare HMO | Attending: Rheumatology | Admitting: Rheumatology

## 2022-05-29 ENCOUNTER — Encounter: Payer: Self-pay | Admitting: Rheumatology

## 2022-05-29 VITALS — BP 128/77 | HR 75 | Ht 66.0 in | Wt 142.8 lb

## 2022-05-29 DIAGNOSIS — M79671 Pain in right foot: Secondary | ICD-10-CM | POA: Diagnosis not present

## 2022-05-29 DIAGNOSIS — M25511 Pain in right shoulder: Secondary | ICD-10-CM

## 2022-05-29 DIAGNOSIS — M5136 Other intervertebral disc degeneration, lumbar region: Secondary | ICD-10-CM

## 2022-05-29 DIAGNOSIS — M1712 Unilateral primary osteoarthritis, left knee: Secondary | ICD-10-CM

## 2022-05-29 DIAGNOSIS — M79641 Pain in right hand: Secondary | ICD-10-CM | POA: Diagnosis not present

## 2022-05-29 DIAGNOSIS — Z8719 Personal history of other diseases of the digestive system: Secondary | ICD-10-CM | POA: Diagnosis not present

## 2022-05-29 DIAGNOSIS — M79672 Pain in left foot: Secondary | ICD-10-CM

## 2022-05-29 DIAGNOSIS — R55 Syncope and collapse: Secondary | ICD-10-CM | POA: Diagnosis not present

## 2022-05-29 DIAGNOSIS — G40109 Localization-related (focal) (partial) symptomatic epilepsy and epileptic syndromes with simple partial seizures, not intractable, without status epilepticus: Secondary | ICD-10-CM

## 2022-05-29 DIAGNOSIS — R69 Illness, unspecified: Secondary | ICD-10-CM | POA: Diagnosis not present

## 2022-05-29 DIAGNOSIS — M25462 Effusion, left knee: Secondary | ICD-10-CM

## 2022-05-29 DIAGNOSIS — F17200 Nicotine dependence, unspecified, uncomplicated: Secondary | ICD-10-CM

## 2022-05-29 DIAGNOSIS — R21 Rash and other nonspecific skin eruption: Secondary | ICD-10-CM

## 2022-05-29 DIAGNOSIS — M79642 Pain in left hand: Secondary | ICD-10-CM

## 2022-05-29 DIAGNOSIS — Z96652 Presence of left artificial knee joint: Secondary | ICD-10-CM | POA: Diagnosis not present

## 2022-05-29 DIAGNOSIS — M0609 Rheumatoid arthritis without rheumatoid factor, multiple sites: Secondary | ICD-10-CM

## 2022-05-29 DIAGNOSIS — Z79899 Other long term (current) drug therapy: Secondary | ICD-10-CM

## 2022-05-29 NOTE — Patient Instructions (Signed)
Standing Labs We placed an order today for your standing lab work.   Please have your standing labs drawn in March and every 3 months  Please have your labs drawn 2 weeks prior to your appointment so that the provider can discuss your lab results at your appointment.  Please note that you may see your imaging and lab results in [facility] before we have reviewed them. We will contact you once all results are reviewed. Please allow our office up to 72 hours to thoroughly review all of the results before contacting the office for clarification of your results.  Lab hours are:   Monday through Thursday from 8:00 am -12:30 pm and 1:00 pm-5:00 pm and Friday from 8:00 am-12:00 pm.  Please be advised, all patients with office appointments requiring lab work will take precedent over walk-in lab work.   Labs are drawn by [facility]. Please bring your co-pay at the time of your lab draw.  You may receive a bill from [facility] for your lab work.  Please note if you are on Hydroxychloroquine and and an order has been placed for a Hydroxychloroquine level, you will need to have it drawn 4 hours or more after your last dose.  If you wish to have your labs drawn at another location, please call the office 24 hours in advance so we can fax the orders.  The office is located at [address] [place], [place], [place] [address] No appointment is necessary.    If you have any questions regarding directions or hours of operation,  please call [phone].   As a reminder, please drink plenty of water prior to coming for your lab work. Thanks!   Vaccines You are taking a medication(s) that can suppress your immune system.  The following immunizations are recommended: Flu annually Covid-19  RSV Td/Tdap (tetanus, diphtheria, pertussis) every 10 years Pneumonia (Prevnar 15 then Pneumovax 23 at least 1 year apart.  Alternatively, can take Prevnar 20 without needing additional dose) Shingrix: 2 doses from 4  weeks to 6 months apart If you have signs or symptoms of an infection or start antibiotics: First, call your PCP for workup of your infection. Hold your medication through the infection, until you complete your antibiotics, and until symptoms resolve if you take the following: Injectable medication (Actemra, Benlysta, Cimzia, Cosentyx, Enbrel, Humira, Kevzara, Orencia, Remicade, Simponi, Stelara, Taltz, Tremfya) Methotrexate Leflunomide (Arava) Mycophenolate (Cellcept) Morrie Sheldon, Olumiant, or Rinvoq  Please get an annual skin examination to screen for skin cancer while you are on Humira  Please check with your PCP to make sure you are up to date.

## 2022-05-31 ENCOUNTER — Other Ambulatory Visit: Payer: Self-pay | Admitting: Rheumatology

## 2022-05-31 DIAGNOSIS — M6281 Muscle weakness (generalized): Secondary | ICD-10-CM | POA: Diagnosis not present

## 2022-05-31 DIAGNOSIS — R269 Unspecified abnormalities of gait and mobility: Secondary | ICD-10-CM | POA: Diagnosis not present

## 2022-05-31 DIAGNOSIS — M1712 Unilateral primary osteoarthritis, left knee: Secondary | ICD-10-CM | POA: Diagnosis not present

## 2022-05-31 DIAGNOSIS — M25662 Stiffness of left knee, not elsewhere classified: Secondary | ICD-10-CM | POA: Diagnosis not present

## 2022-06-14 ENCOUNTER — Encounter: Payer: Self-pay | Admitting: *Deleted

## 2022-06-14 DIAGNOSIS — M1712 Unilateral primary osteoarthritis, left knee: Secondary | ICD-10-CM | POA: Diagnosis not present

## 2022-06-18 DIAGNOSIS — R269 Unspecified abnormalities of gait and mobility: Secondary | ICD-10-CM | POA: Diagnosis not present

## 2022-06-18 DIAGNOSIS — M1712 Unilateral primary osteoarthritis, left knee: Secondary | ICD-10-CM | POA: Diagnosis not present

## 2022-06-18 DIAGNOSIS — M25662 Stiffness of left knee, not elsewhere classified: Secondary | ICD-10-CM | POA: Diagnosis not present

## 2022-06-18 DIAGNOSIS — M6281 Muscle weakness (generalized): Secondary | ICD-10-CM | POA: Diagnosis not present

## 2022-06-21 ENCOUNTER — Encounter: Payer: Self-pay | Admitting: Radiology

## 2022-06-27 DIAGNOSIS — M47816 Spondylosis without myelopathy or radiculopathy, lumbar region: Secondary | ICD-10-CM | POA: Diagnosis not present

## 2022-07-02 ENCOUNTER — Other Ambulatory Visit: Payer: Self-pay | Admitting: *Deleted

## 2022-07-02 DIAGNOSIS — M0609 Rheumatoid arthritis without rheumatoid factor, multiple sites: Secondary | ICD-10-CM

## 2022-07-02 DIAGNOSIS — Z8719 Personal history of other diseases of the digestive system: Secondary | ICD-10-CM

## 2022-07-02 DIAGNOSIS — Z79899 Other long term (current) drug therapy: Secondary | ICD-10-CM

## 2022-07-02 MED ORDER — HUMIRA (2 PEN) 40 MG/0.4ML ~~LOC~~ AJKT: 40.0000 mg | AUTO-INJECTOR | SUBCUTANEOUS | 0 refills | Status: DC

## 2022-07-02 NOTE — Telephone Encounter (Signed)
Next Visit: 11/07/2022  Last Visit: 05/29/2022  Last Fill: 05/02/2022  DX: Rheumatoid arthritis of multiple sites with negative rheumatoid factor    Current Dose per office note 05/29/2022: Humira 40 mg sq injections every 14 days.   Labs: 04/20/2022 Glucose 104, total protein 8.2, RBC 3.95, Hemoglobin 13.0, HCT 38.1, Eosinophils 0, Absolute Monocytes 1,088  TB Gold: 09/15/2021 negative    Okay to refill Humira?

## 2022-07-06 ENCOUNTER — Encounter: Payer: Self-pay | Admitting: Neurology

## 2022-07-06 ENCOUNTER — Ambulatory Visit: Payer: Medicare HMO | Admitting: Neurology

## 2022-07-06 VITALS — BP 145/79 | HR 71 | Ht 66.0 in | Wt 146.2 lb

## 2022-07-06 DIAGNOSIS — G40009 Localization-related (focal) (partial) idiopathic epilepsy and epileptic syndromes with seizures of localized onset, not intractable, without status epilepticus: Secondary | ICD-10-CM | POA: Diagnosis not present

## 2022-07-06 MED ORDER — OXCARBAZEPINE 300 MG PO TABS: ORAL_TABLET | ORAL | 11 refills | Status: DC
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
## 2022-07-06 NOTE — Patient Instructions (Addendum)
Good to see you doing better. Continue oxcarbazepine 300mg : Take 2 tablets twice a day. Follow-up in 6-7 months, call for any changes.    Seizure Precautions: 1. If medication has been prescribed for you to prevent seizures, take it exactly as directed.  Do not stop taking the medicine without talking to your doctor first, even if you have not had a seizure in a long time.   2. Avoid activities in which a seizure would cause danger to yourself or to others.  Don't operate dangerous machinery, swim alone, or climb in high or dangerous places, such as on ladders, roofs, or girders.  Do not drive unless your doctor says you may.  3. If you have any warning that you may have a seizure, lay down in a safe place where you can't hurt yourself.    4.  No driving for 6 months from last seizure, as per [facility].   Please refer to the following link on the [place] website for more information: [email or web address]   5.  Maintain good sleep hygiene. Avoid alcohol.  6.  Contact your doctor if you have any problems that may be related to the medicine you are taking.  7.  Call 911 and bring the patient back to the ED if:        A.  The seizure lasts longer than 5 minutes.       B.  The patient doesn't awaken shortly after the seizure  C.  The patient has new problems such as difficulty seeing, speaking or moving  D.  The patient was injured during the seizure  E.  The patient has a temperature over 102 F (39C)  F.  The patient vomited and now is having trouble breathing

## 2022-07-26 DIAGNOSIS — M1712 Unilateral primary osteoarthritis, left knee: Secondary | ICD-10-CM | POA: Diagnosis not present

## 2022-07-31 DIAGNOSIS — M545 Low back pain, unspecified: Secondary | ICD-10-CM | POA: Diagnosis not present

## 2022-08-01 DIAGNOSIS — M545 Low back pain, unspecified: Secondary | ICD-10-CM | POA: Diagnosis not present

## 2022-08-19 ENCOUNTER — Other Ambulatory Visit: Payer: Self-pay

## 2022-08-19 ENCOUNTER — Emergency Department (HOSPITAL_COMMUNITY): Payer: Medicare HMO

## 2022-08-19 ENCOUNTER — Observation Stay (HOSPITAL_COMMUNITY)
Admission: EM | Admit: 2022-08-19 | Discharge: 2022-08-22 | Disposition: A | Discharge status: Home / Self Care | Payer: Medicare HMO | Attending: Neurosurgery | Admitting: Neurosurgery

## 2022-08-19 DIAGNOSIS — M4802 Spinal stenosis, cervical region: Secondary | ICD-10-CM | POA: Insufficient documentation

## 2022-08-19 DIAGNOSIS — Z96652 Presence of left artificial knee joint: Secondary | ICD-10-CM | POA: Diagnosis not present

## 2022-08-19 DIAGNOSIS — Z23 Encounter for immunization: Secondary | ICD-10-CM | POA: Diagnosis not present

## 2022-08-19 DIAGNOSIS — S32021A Stable burst fracture of second lumbar vertebra, initial encounter for closed fracture: Secondary | ICD-10-CM | POA: Diagnosis not present

## 2022-08-19 DIAGNOSIS — M4312 Spondylolisthesis, cervical region: Secondary | ICD-10-CM | POA: Insufficient documentation

## 2022-08-19 DIAGNOSIS — S81012A Laceration without foreign body, left knee, initial encounter: Secondary | ICD-10-CM | POA: Diagnosis not present

## 2022-08-19 DIAGNOSIS — S32010A Wedge compression fracture of first lumbar vertebra, initial encounter for closed fracture: Secondary | ICD-10-CM | POA: Diagnosis not present

## 2022-08-19 DIAGNOSIS — K5730 Diverticulosis of large intestine without perforation or abscess without bleeding: Secondary | ICD-10-CM | POA: Diagnosis not present

## 2022-08-19 DIAGNOSIS — F17210 Nicotine dependence, cigarettes, uncomplicated: Secondary | ICD-10-CM | POA: Insufficient documentation

## 2022-08-19 DIAGNOSIS — M545 Low back pain, unspecified: Secondary | ICD-10-CM | POA: Diagnosis not present

## 2022-08-19 DIAGNOSIS — S61210A Laceration without foreign body of right index finger without damage to nail, initial encounter: Secondary | ICD-10-CM | POA: Diagnosis not present

## 2022-08-19 DIAGNOSIS — M2578 Osteophyte, vertebrae: Secondary | ICD-10-CM | POA: Insufficient documentation

## 2022-08-19 DIAGNOSIS — S32000A Wedge compression fracture of unspecified lumbar vertebra, initial encounter for closed fracture: Secondary | ICD-10-CM | POA: Diagnosis present

## 2022-08-19 DIAGNOSIS — I959 Hypotension, unspecified: Secondary | ICD-10-CM | POA: Diagnosis not present

## 2022-08-19 DIAGNOSIS — M79641 Pain in right hand: Secondary | ICD-10-CM | POA: Diagnosis not present

## 2022-08-19 DIAGNOSIS — M549 Dorsalgia, unspecified: Secondary | ICD-10-CM | POA: Diagnosis present

## 2022-08-19 DIAGNOSIS — M25462 Effusion, left knee: Secondary | ICD-10-CM | POA: Diagnosis not present

## 2022-08-19 DIAGNOSIS — S32011A Stable burst fracture of first lumbar vertebra, initial encounter for closed fracture: Secondary | ICD-10-CM | POA: Diagnosis not present

## 2022-08-19 DIAGNOSIS — R58 Hemorrhage, not elsewhere classified: Secondary | ICD-10-CM | POA: Diagnosis not present

## 2022-08-19 DIAGNOSIS — S0990XA Unspecified injury of head, initial encounter: Secondary | ICD-10-CM | POA: Insufficient documentation

## 2022-08-19 DIAGNOSIS — S32001A Stable burst fracture of unspecified lumbar vertebra, initial encounter for closed fracture: Secondary | ICD-10-CM

## 2022-08-19 DIAGNOSIS — S2231XA Fracture of one rib, right side, initial encounter for closed fracture: Secondary | ICD-10-CM | POA: Diagnosis not present

## 2022-08-19 DIAGNOSIS — Z743 Need for continuous supervision: Secondary | ICD-10-CM | POA: Diagnosis not present

## 2022-08-19 LAB — COMPREHENSIVE METABOLIC PANEL
ALT: 27 U/L (ref 0–44)
AST: 37 U/L (ref 15–41)
Albumin: 3.8 g/dL (ref 3.5–5.0)
Alkaline Phosphatase: 67 U/L (ref 38–126)
Anion gap: 11 (ref 5–15)
BUN: 6 mg/dL — ABNORMAL LOW (ref 8–23)
CO2: 22 mmol/L (ref 22–32)
Calcium: 9 mg/dL (ref 8.9–10.3)
Chloride: 101 mmol/L (ref 98–111)
Creatinine, Ser: 0.8 mg/dL (ref 0.61–1.24)
GFR, Estimated: >60 mL/min (ref >60)
Glucose, Bld: 100 mg/dL — ABNORMAL HIGH (ref 70–99)
Potassium: 3.8 mmol/L (ref 3.5–5.1)
Sodium: 134 mmol/L — ABNORMAL LOW (ref 135–145)
Total Bilirubin: 0.7 mg/dL (ref 0.3–1.2)
Total Protein: 6.8 g/dL (ref 6.5–8.1)

## 2022-08-19 LAB — CBC
HCT: 39.5 % (ref 39.0–52.0)
Hemoglobin: 12.7 g/dL — ABNORMAL LOW (ref 13.0–17.0)
MCH: 31.5 pg (ref 26.0–34.0)
MCHC: 32.2 g/dL (ref 30.0–36.0)
MCV: 98 fL (ref 80.0–100.0)
Platelets: 300 10*3/uL (ref 150–400)
RBC: 4.03 MIL/uL — ABNORMAL LOW (ref 4.22–5.81)
RDW: 15.7 % — ABNORMAL HIGH (ref 11.5–15.5)
WBC: 13.4 10*3/uL — ABNORMAL HIGH (ref 4.0–10.5)
nRBC: 0 % (ref 0.0–0.2)

## 2022-08-19 LAB — SAMPLE TO BLOOD BANK

## 2022-08-19 LAB — URINALYSIS, ROUTINE W REFLEX MICROSCOPIC
Bilirubin Urine: NEGATIVE
Glucose, UA: NEGATIVE mg/dL
Hgb urine dipstick: NEGATIVE
Ketones, ur: 5 mg/dL — AB
Leukocytes,Ua: NEGATIVE
Nitrite: NEGATIVE
Protein, ur: NEGATIVE mg/dL
Specific Gravity, Urine: 1.011 (ref 1.005–1.030)
pH: 6 (ref 5.0–8.0)

## 2022-08-19 LAB — PROTIME-INR
INR: 1 (ref 0.8–1.2)
Prothrombin Time: 13.6 seconds (ref 11.4–15.2)

## 2022-08-19 LAB — ETHANOL: Alcohol, Ethyl (B): <10 mg/dL (ref <10)

## 2022-08-19 MED ORDER — LIDOCAINE HCL (PF) 1 % IJ SOLN
10.0000 mL | Freq: Once | INTRAMUSCULAR | Status: AC
  Administered 2022-08-19: 10 mL
  Filled 2022-08-19: qty 10

## 2022-08-19 MED ORDER — IOHEXOL 350 MG/ML SOLN
75.0000 mL | Freq: Once | INTRAVENOUS | Status: AC | PRN
  Administered 2022-08-19: 75 mL via INTRAVENOUS

## 2022-08-19 MED ORDER — MORPHINE SULFATE (PF) 2 MG/ML IV SOLN
2.0000 mg | INTRAVENOUS | Status: DC | PRN
  Administered 2022-08-19: 2 mg via INTRAVENOUS
  Filled 2022-08-19: qty 1

## 2022-08-19 MED ORDER — TETANUS-DIPHTH-ACELL PERTUSSIS 5-2.5-18.5 LF-MCG/0.5 IM SUSY
0.5000 mL | PREFILLED_SYRINGE | Freq: Once | INTRAMUSCULAR | Status: AC
  Administered 2022-08-19: 0.5 mL via INTRAMUSCULAR
  Filled 2022-08-19: qty 0.5

## 2022-08-19 MED ORDER — METHOCARBAMOL 1000 MG/10ML IJ SOLN: 500.0000 mg | Freq: Three times a day (TID) | INTRAMUSCULAR | Status: DC

## 2022-08-19 MED ORDER — FENTANYL CITRATE PF 50 MCG/ML IJ SOSY
50.0000 ug | PREFILLED_SYRINGE | Freq: Once | INTRAMUSCULAR | Status: AC
  Administered 2022-08-19: 50 ug via INTRAVENOUS
  Filled 2022-08-19: qty 1

## 2022-08-19 MED ORDER — GABAPENTIN 100 MG PO CAPS
100.0000 mg | ORAL_CAPSULE | Freq: Three times a day (TID) | ORAL | Status: DC
  Administered 2022-08-20 – 2022-08-22 (×9): 100 mg via ORAL
  Filled 2022-08-19 (×9): qty 1

## 2022-08-19 MED ORDER — METHOCARBAMOL 1000 MG/10ML IJ SOLN
500.0000 mg | Freq: Once | INTRAVENOUS | Status: AC
  Administered 2022-08-20: 500 mg via INTRAVENOUS
  Filled 2022-08-19: qty 500
  Filled 2022-08-19: qty 5
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
## 2022-08-20 ENCOUNTER — Encounter (HOSPITAL_COMMUNITY): Payer: Self-pay | Admitting: Neurosurgery

## 2022-08-20 DIAGNOSIS — S32010A Wedge compression fracture of first lumbar vertebra, initial encounter for closed fracture: Secondary | ICD-10-CM | POA: Diagnosis not present

## 2022-08-20 DIAGNOSIS — S32000A Wedge compression fracture of unspecified lumbar vertebra, initial encounter for closed fracture: Secondary | ICD-10-CM | POA: Diagnosis present

## 2022-08-20 LAB — CBC
HCT: 37.3 % — ABNORMAL LOW (ref 39.0–52.0)
Hemoglobin: 12.4 g/dL — ABNORMAL LOW (ref 13.0–17.0)
MCH: 32 pg (ref 26.0–34.0)
MCHC: 33.2 g/dL (ref 30.0–36.0)
MCV: 96.4 fL (ref 80.0–100.0)
Platelets: 271 10*3/uL (ref 150–400)
RBC: 3.87 MIL/uL — ABNORMAL LOW (ref 4.22–5.81)
RDW: 15.6 % — ABNORMAL HIGH (ref 11.5–15.5)
WBC: 9 10*3/uL (ref 4.0–10.5)
nRBC: 0 % (ref 0.0–0.2)

## 2022-08-20 LAB — CREATININE, SERUM
Creatinine, Ser: 0.76 mg/dL (ref 0.61–1.24)
GFR, Estimated: >60 mL/min (ref >60)

## 2022-08-20 MED ORDER — HEPARIN SODIUM (PORCINE) 5000 UNIT/ML IJ SOLN
5000.0000 [IU] | Freq: Three times a day (TID) | INTRAMUSCULAR | Status: DC
  Administered 2022-08-20 – 2022-08-22 (×8): 5000 [IU] via SUBCUTANEOUS
  Filled 2022-08-20 (×8): qty 1

## 2022-08-20 MED ORDER — DIAZEPAM 5 MG PO TABS: 5.0000 mg | ORAL_TABLET | Freq: Four times a day (QID) | ORAL | Status: DC | PRN

## 2022-08-20 MED ORDER — MAGNESIUM CITRATE PO SOLN: 1.0000 | Freq: Once | ORAL | Status: DC | PRN

## 2022-08-20 MED ORDER — ONDANSETRON HCL 4 MG/2ML IJ SOLN: 4.0000 mg | Freq: Four times a day (QID) | INTRAMUSCULAR | Status: DC | PRN

## 2022-08-20 MED ORDER — SENNOSIDES-DOCUSATE SODIUM 8.6-50 MG PO TABS: 1.0000 | ORAL_TABLET | Freq: Every evening | ORAL | Status: DC | PRN

## 2022-08-20 MED ORDER — BISACODYL 5 MG PO TBEC: 5.0000 mg | DELAYED_RELEASE_TABLET | Freq: Every day | ORAL | Status: DC | PRN

## 2022-08-20 MED ORDER — ACETAMINOPHEN 325 MG PO TABS: 650.0000 mg | ORAL_TABLET | Freq: Four times a day (QID) | ORAL | Status: DC | PRN

## 2022-08-20 MED ORDER — HYDROCODONE-ACETAMINOPHEN 5-325 MG PO TABS
1.0000 | ORAL_TABLET | ORAL | Status: DC | PRN
  Administered 2022-08-20 – 2022-08-21 (×4): 2 via ORAL
  Administered 2022-08-21 – 2022-08-22 (×2): 1 via ORAL
  Administered 2022-08-22 (×2): 2 via ORAL
  Filled 2022-08-20: qty 1
  Filled 2022-08-20: qty 2
  Filled 2022-08-20: qty 1
  Filled 2022-08-20 (×5): qty 2

## 2022-08-20 MED ORDER — OXCARBAZEPINE 300 MG PO TABS
600.0000 mg | ORAL_TABLET | Freq: Two times a day (BID) | ORAL | Status: DC
  Administered 2022-08-20 – 2022-08-22 (×6): 600 mg via ORAL
  Filled 2022-08-20 (×7): qty 2

## 2022-08-20 MED ORDER — ONDANSETRON HCL 4 MG PO TABS: 4.0000 mg | ORAL_TABLET | Freq: Four times a day (QID) | ORAL | Status: DC | PRN

## 2022-08-20 MED ORDER — ACETAMINOPHEN 650 MG RE SUPP: 650.0000 mg | Freq: Four times a day (QID) | RECTAL | Status: DC | PRN

## 2022-08-20 MED ORDER — MORPHINE SULFATE (PF) 2 MG/ML IV SOLN: 2.0000 mg | INTRAVENOUS | Status: DC | PRN

## 2022-08-20 MED ORDER — POTASSIUM CHLORIDE IN NACL 20-0.45 MEQ/L-% IV SOLN
INTRAVENOUS | Status: DC
  Filled 2022-08-20 (×3): qty 1000

## 2022-08-20 MED ORDER — SENNA 8.6 MG PO TABS
1.0000 | ORAL_TABLET | Freq: Two times a day (BID) | ORAL | Status: DC
  Administered 2022-08-20 – 2022-08-22 (×5): 8.6 mg via ORAL
  Filled 2022-08-20 (×5): qty 1
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
## 2022-08-20 NOTE — Progress Notes (Signed)
Patient ID: Terry Guerrero, male   DOB: June 20, 1960, 62 y.o.   MRN: [id number] BP 121/80 (BP Location: Left Arm)   Pulse 68   Temp 98.6 F (37 C) (Oral)   Resp (!) 21   Ht 5\' 6"  (1.676 m)   Wt 64.9 kg   SpO2 97%   BMI 23.08 kg/m  Alert and oriented x 4, speech is clear and fluent. Moving all extremities Did not work with pt today Brace is now available Will wait for PT
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
## 2022-08-20 NOTE — Progress Notes (Signed)
Orthopedic Tech Progress Note Patient Details:  Terry Guerrero March 15, 1961 [id number]  Ortho Devices Type of Ortho Device: Lumbar corsett Ortho Device/Splint Location: BACK Ortho Device/Splint Interventions: Adjustment, Ordered   Post Interventions Patient Tolerated: Well Instructions Provided: Care of device  Donald Pore 08/20/2022, 12:04 PM

## 2022-08-20 NOTE — Progress Notes (Signed)
PT Cancellation Note  Patient Details Name: MEADE HOGELAND MRN: [id number] DOB: 06-09-1960   Cancelled Treatment:    Reason Eval/Treat Not Completed: Medical issues which prohibited therapy.  Awaiting his back brace, retry at another time.   Ivar Drape 08/20/2022, 10:34 AM  Samul Dada, PT PhD Acute Rehab Dept. Number: [facility] [id number] and [facility] [phone]

## 2022-08-20 NOTE — Progress Notes (Signed)
4 eyes with Kathryne Hitch RN. Red Bruise to right 3rd and 4th toe, right lateral side red abrasion, stitches right pointer finger. Tick btie right back thigh red circular area, stitches left knee

## 2022-08-20 NOTE — Plan of Care (Signed)

## 2022-08-21 DIAGNOSIS — S32010A Wedge compression fracture of first lumbar vertebra, initial encounter for closed fracture: Secondary | ICD-10-CM | POA: Diagnosis not present

## 2022-08-21 NOTE — Plan of Care (Signed)
  Problem: Health Behavior/Discharge Planning: Goal: Ability to manage health-related needs will improve Outcome: Progressing   
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
## 2022-08-21 NOTE — Plan of Care (Signed)
  Problem: Activity: Goal: Risk for activity intolerance will decrease Outcome: Progressing   Problem: Pain Managment: Goal: General experience of comfort will improve Outcome: Progressing   

## 2022-08-21 NOTE — Progress Notes (Signed)
Patient ID: Terry Guerrero, male   DOB: [birth year], 61 y.o.   MRN: [id number] Alert and oriented x 4, speech is clear and fluent Moving all extremities well [facility] for discharge tomorrow
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
## 2022-08-21 NOTE — Care Management Obs Status (Signed)
MEDICARE OBSERVATION STATUS NOTIFICATION   Patient Details  Name: Terry Guerrero MRN: [id number] Date of Birth: 03-04-1961   Medicare Observation Status Notification Given:  Yes    Lawerance Sabal, RN 08/21/2022, 8:27 AM

## 2022-08-21 NOTE — Plan of Care (Signed)
  Problem: Health Behavior/Discharge Planning: Goal: Ability to manage health-related needs will improve Outcome: Progressing   Problem: Pain Managment: Goal: General experience of comfort will improve Outcome: Progressing   Problem: Safety: Goal: Ability to remain free from injury will improve Outcome: Progressing   

## 2022-08-22 DIAGNOSIS — S32010A Wedge compression fracture of first lumbar vertebra, initial encounter for closed fracture: Secondary | ICD-10-CM | POA: Diagnosis not present

## 2022-08-22 MED ORDER — TIZANIDINE HCL 4 MG PO TABS: 4.0000 mg | ORAL_TABLET | Freq: Four times a day (QID) | ORAL | 0 refills | Status: DC | PRN

## 2022-08-22 MED ORDER — HYDROCODONE-ACETAMINOPHEN 5-325 MG PO TABS: 1.0000 | ORAL_TABLET | Freq: Four times a day (QID) | ORAL | 0 refills | Status: DC | PRN
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
## 2022-08-22 NOTE — Plan of Care (Signed)

## 2022-08-22 NOTE — Discharge Instructions (Signed)
Wear brace when not in bed.

## 2022-09-03 ENCOUNTER — Other Ambulatory Visit: Payer: Self-pay

## 2022-09-03 ENCOUNTER — Other Ambulatory Visit: Payer: Self-pay | Admitting: Rheumatology
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
## 2022-09-04 NOTE — Telephone Encounter (Signed)
Requested medication (s) are due for refill today:no to either  Requested medication (s) are on the active medication list: yes  Last refill:  both last ordered 08/22/22   Future visit scheduled: yes  Notes to clinic:  Both meds ordered by Dr Franky Macho- NT not delegated to refuse or reorder these medications   Requested Prescriptions  Pending Prescriptions Disp Refills   tiZANidine (ZANAFLEX) 4 MG tablet 60 tablet 0    Sig: Take 1 tablet (4 mg total) by mouth every 6 (six) hours as needed for muscle spasms.     Not Delegated - Cardiovascular:  Alpha-2 Agonists - tizanidine Failed - 09/03/2022 12:04 PM      Failed - This refill cannot be delegated      Failed - Valid encounter within last 6 months    Recent Outpatient Visits           1 year ago Syncope and collapse   [facility] Pickard, Priscille Heidelberg, MD   1 year ago Syncope and collapse   [facility] Pickard, Priscille Heidelberg, MD   1 year ago Bilateral sciatica   [facility] Tanya Nones Priscille Heidelberg, MD   1 year ago Tick bite of thigh, unspecified laterality, initial encounter   [facility] Valentino Nose, NP   3 years ago Plantar fasciitis, left   [facility] Pickard, Priscille Heidelberg, MD       Future Appointments             In 3 days Pickard, Priscille Heidelberg, MD [facility], [facility]   In 2 months Pollyann Savoy, MD [facility]             HYDROcodone-acetaminophen (NORCO/VICODIN) 5-325 MG tablet 70 tablet 0    Sig: Take 1 tablet by mouth every 6 (six) hours as needed for moderate pain.     Not Delegated - Analgesics:  Opioid Agonist Combinations Failed - 09/03/2022 12:04 PM      Failed - This refill cannot be delegated      Failed - Urine Drug Screen completed in last 360 days      Failed - Valid encounter within last 3 months    Recent Outpatient Visits           1 year ago Syncope and collapse   [facility]  Family Medicine Tanya Nones Priscille Heidelberg, MD   1 year ago Syncope and collapse   [facility] Pickard, Priscille Heidelberg, MD   1 year ago Bilateral sciatica   [facility] Tanya Nones Priscille Heidelberg, MD   1 year ago Tick bite of thigh, unspecified laterality, initial encounter   [facility] Valentino Nose, NP   3 years ago Plantar fasciitis, left   [facility] Pickard, Priscille Heidelberg, MD       Future Appointments             In 3 days Pickard, Priscille Heidelberg, MD [facility], [facility]   In 2 months Pollyann Savoy, MD [facility] Rheumatology

## 2022-09-07 ENCOUNTER — Ambulatory Visit (INDEPENDENT_AMBULATORY_CARE_PROVIDER_SITE_OTHER): Payer: Medicare HMO | Admitting: Family Medicine

## 2022-09-07 ENCOUNTER — Encounter: Payer: Self-pay | Admitting: Family Medicine

## 2022-09-07 ENCOUNTER — Telehealth: Payer: Self-pay

## 2022-09-07 VITALS — BP 120/62 | HR 68 | Temp 98.6°F | Ht 66.0 in | Wt 138.0 lb

## 2022-09-07 DIAGNOSIS — S32011D Stable burst fracture of first lumbar vertebra, subsequent encounter for fracture with routine healing: Secondary | ICD-10-CM | POA: Diagnosis not present

## 2022-09-07 MED ORDER — CYCLOBENZAPRINE HCL 10 MG PO TABS: 10.0000 mg | ORAL_TABLET | Freq: Three times a day (TID) | ORAL | 0 refills | Status: DC | PRN

## 2022-09-07 MED ORDER — HYDROCODONE-ACETAMINOPHEN 5-325 MG PO TABS: 1.0000 | ORAL_TABLET | Freq: Four times a day (QID) | ORAL | 0 refills | Status: DC | PRN

## 2022-09-07 NOTE — Telephone Encounter (Signed)
Pt called and states the pharmacy will not refill his pain medication until the 20th. Pt asks if a different strength can be sent in, so he can have pain medication on hand?
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
## 2022-09-19 ENCOUNTER — Other Ambulatory Visit: Payer: Self-pay | Admitting: *Deleted

## 2022-09-19 DIAGNOSIS — M069 Rheumatoid arthritis, unspecified: Secondary | ICD-10-CM

## 2022-09-19 DIAGNOSIS — Z8719 Personal history of other diseases of the digestive system: Secondary | ICD-10-CM

## 2022-09-19 DIAGNOSIS — M0609 Rheumatoid arthritis without rheumatoid factor, multiple sites: Secondary | ICD-10-CM

## 2022-09-19 DIAGNOSIS — Z79899 Other long term (current) drug therapy: Secondary | ICD-10-CM

## 2022-09-19 MED ORDER — HUMIRA (2 PEN) 40 MG/0.4ML ~~LOC~~ AJKT: 40.0000 mg | AUTO-INJECTOR | SUBCUTANEOUS | 0 refills | Status: DC

## 2022-09-19 NOTE — Telephone Encounter (Signed)
Last Fill: 07/02/2022  Labs: 08/20/2022 RBC 3.87, Hemoglobin 12.4, HCT 37.3, RDW 15.6, 08/19/2022   TB Gold: 09/15/2021  NEGATIVE   Next Visit: 11/07/2022  Last Visit: 05/29/2022  ZO:[id number] arthritis of multiple sites with negative rheumatoid factor   Current Dose per office note 05/29/2022: Humira 40 mg sq injections every 14 days.   Okay to refill Humira?

## 2022-09-19 NOTE — Telephone Encounter (Signed)
LMOM labs due

## 2022-09-19 NOTE — Telephone Encounter (Signed)
Due to update TB gold

## 2022-10-03 ENCOUNTER — Other Ambulatory Visit: Payer: Self-pay | Admitting: Family Medicine

## 2022-10-03 ENCOUNTER — Telehealth: Payer: Self-pay

## 2022-10-03 NOTE — Telephone Encounter (Signed)
Prescription Request  10/03/2022  LOV: 09/07/22  What is the name of the medication or equipment? HYDROcodone-acetaminophen (NORCO) 5-325 MG tablet [[id number]]  Have you contacted your pharmacy to request a refill? No   Which pharmacy would you like this sent to?[id number] [place], [place] - [address] [place] [address] Phone: [phone]  Fax: [phone]      Patient notified that their request is being sent to the clinical staff for review and that they should receive a response within 2 business days.   Please advise at [facility] [phone]
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
## 2022-11-07 ENCOUNTER — Encounter: Payer: Self-pay | Admitting: Rheumatology

## 2022-11-07 ENCOUNTER — Ambulatory Visit: Payer: Medicare HMO | Attending: Rheumatology | Admitting: Rheumatology

## 2022-11-07 VITALS — BP 136/77 | HR 71 | Resp 17 | Ht 66.0 in | Wt 135.0 lb

## 2022-11-07 DIAGNOSIS — R55 Syncope and collapse: Secondary | ICD-10-CM | POA: Diagnosis not present

## 2022-11-07 DIAGNOSIS — M5136 Other intervertebral disc degeneration, lumbar region: Secondary | ICD-10-CM | POA: Diagnosis not present

## 2022-11-07 DIAGNOSIS — Z96652 Presence of left artificial knee joint: Secondary | ICD-10-CM | POA: Diagnosis not present

## 2022-11-07 DIAGNOSIS — G40109 Localization-related (focal) (partial) symptomatic epilepsy and epileptic syndromes with simple partial seizures, not intractable, without status epilepticus: Secondary | ICD-10-CM | POA: Diagnosis not present

## 2022-11-07 DIAGNOSIS — S32011D Stable burst fracture of first lumbar vertebra, subsequent encounter for fracture with routine healing: Secondary | ICD-10-CM

## 2022-11-07 DIAGNOSIS — M79642 Pain in left hand: Secondary | ICD-10-CM

## 2022-11-07 DIAGNOSIS — M79641 Pain in right hand: Secondary | ICD-10-CM

## 2022-11-07 DIAGNOSIS — M0609 Rheumatoid arthritis without rheumatoid factor, multiple sites: Secondary | ICD-10-CM | POA: Diagnosis not present

## 2022-11-07 DIAGNOSIS — M79671 Pain in right foot: Secondary | ICD-10-CM | POA: Diagnosis not present

## 2022-11-07 DIAGNOSIS — Z8719 Personal history of other diseases of the digestive system: Secondary | ICD-10-CM

## 2022-11-07 DIAGNOSIS — Z79899 Other long term (current) drug therapy: Secondary | ICD-10-CM | POA: Diagnosis not present

## 2022-11-07 DIAGNOSIS — M51369 Other intervertebral disc degeneration, lumbar region without mention of lumbar back pain or lower extremity pain: Secondary | ICD-10-CM

## 2022-11-07 DIAGNOSIS — M79672 Pain in left foot: Secondary | ICD-10-CM

## 2022-11-07 DIAGNOSIS — F17200 Nicotine dependence, unspecified, uncomplicated: Secondary | ICD-10-CM

## 2022-11-07 NOTE — Patient Instructions (Signed)
Standing Labs We placed an order today for your standing lab work.   Please have your standing labs drawn in September and every 3 months  Please have your labs drawn 2 weeks prior to your appointment so that the provider can discuss your lab results at your appointment, if possible.  Please note that you may see your imaging and lab results in MyChart before we have reviewed them. We will contact you once all results are reviewed. Please allow our office up to 72 hours to thoroughly review all of the results before contacting the office for clarification of your results.  WALK-IN LAB HOURS  Monday through Thursday from 8:00 am -12:30 pm and 1:00 pm-5:00 pm and Friday from 8:00 am-12:00 pm.  Patients with office visits requiring labs will be seen before walk-in labs.  You may encounter longer than normal wait times. Please allow additional time. Wait times may be shorter on  Monday and Thursday afternoons.  We do not book appointments for walk-in labs. We appreciate your patience and understanding with our staff.   Labs are drawn by [facility]. Please bring your co-pay at the time of your lab draw.  You may receive a bill from [facility] for your lab work.  Please note if you are on Hydroxychloroquine and and an order has been placed for a Hydroxychloroquine level,  you will need to have it drawn 4 hours or more after your last dose.  If you wish to have your labs drawn at another location, please call the office 24 hours in advance so we can fax the orders.  The office is located at [address], [place], [place] [address]   If you have any questions regarding directions or hours of operation,  please call [phone].   As a reminder, please drink plenty of water prior to coming for your lab work. Thanks!  Vaccines You are taking a medication(s) that can suppress your immune system.  The following immunizations are recommended: Flu annually Covid-19  RSV Td/Tdap (tetanus,  diphtheria, pertussis) every 10 years Pneumonia (Prevnar 15 then Pneumovax 23 at least 1 year apart.  Alternatively, can take Prevnar 20 without needing additional dose) Shingrix: 2 doses from 4 weeks to 6 months apart  Please check with your PCP to make sure you are up to date.   If you have signs or symptoms of an infection or start antibiotics: First, call your PCP for workup of your infection. Hold your medication through the infection, until you complete your antibiotics, and until symptoms resolve if you take the following: Injectable medication (Actemra, Benlysta, Cimzia, Cosentyx, Enbrel, Humira, Kevzara, Orencia, Remicade, Simponi, Stelara, Taltz, Tremfya) Methotrexate Leflunomide (Arava) Mycophenolate (Cellcept) Osborne [place], or Rinvoq  Get an annual skin examination to screen for skin cancer while you are on Humira.  Please use sunscreen and sun protection.

## 2022-11-08 LAB — COMPLETE METABOLIC PANEL WITH GFR
AG Ratio: 2.1 (calc) (ref 1.0–2.5)
BUN: 9 mg/dL (ref 7–25)
Calcium: 9.3 mg/dL (ref 8.6–10.3)
Chloride: 107 mmol/L (ref 98–110)
Creat: 0.76 mg/dL (ref 0.70–1.35)
Globulin: 2.1 g/dL (calc) (ref 1.9–3.7)
Glucose, Bld: 97 mg/dL (ref 65–99)
Potassium: 4.2 mmol/L (ref 3.5–5.3)
Total Bilirubin: 0.3 mg/dL (ref 0.2–1.2)
Total Protein: 6.6 g/dL (ref 6.1–8.1)
eGFR: 102 mL/min/{1.73_m2} (ref >=60)

## 2022-11-08 LAB — CBC WITH DIFFERENTIAL/PLATELET
Absolute Monocytes: 882 cells/uL (ref 200–950)
Basophils Relative: 1 %
Eosinophils Relative: 6 %
Lymphs Abs: 3510 cells/uL (ref 850–3900)
MCH: 32.5 pg (ref 27.0–33.0)
MCHC: 34 g/dL (ref 32.0–36.0)
MCV: 95.4 fL (ref 80.0–100.0)
MPV: 9.9 fL (ref 7.5–12.5)
RDW: 12.7 % (ref 11.0–15.0)
Total Lymphocyte: 39 %
WBC: 9 10*3/uL (ref 3.8–10.8)

## 2022-11-15 LAB — CBC WITH DIFFERENTIAL/PLATELET
Basophils Absolute: 90 cells/uL (ref 0–200)
Eosinophils Absolute: 540 cells/uL — ABNORMAL HIGH (ref 15–500)
HCT: 39.7 % (ref 38.5–50.0)
Hemoglobin: 13.5 g/dL (ref 13.2–17.1)
Monocytes Relative: 9.8 %
Neutro Abs: 3978 cells/uL (ref 1500–7800)
Neutrophils Relative %: 44.2 %
Platelets: 338 10*3/uL (ref 140–400)
RBC: 4.16 10*6/uL — ABNORMAL LOW (ref 4.20–5.80)

## 2022-11-15 LAB — COMPLETE METABOLIC PANEL WITH GFR
ALT: 11 U/L (ref 9–46)
AST: 15 U/L (ref 10–35)
Albumin: 4.5 g/dL (ref 3.6–5.1)
Alkaline phosphatase (APISO): 78 U/L (ref 35–144)
CO2: 30 mmol/L (ref 20–32)
Sodium: 140 mmol/L (ref 135–146)

## 2022-11-15 LAB — QUANTIFERON-TB GOLD PLUS
NIL: 0.03 IU/mL
QuantiFERON-TB Gold Plus: NEGATIVE
TB1-NIL: 0 IU/mL
TB2-NIL: 0 IU/mL

## 2022-12-03 ENCOUNTER — Other Ambulatory Visit: Payer: Self-pay | Admitting: *Deleted

## 2022-12-03 DIAGNOSIS — Z8719 Personal history of other diseases of the digestive system: Secondary | ICD-10-CM

## 2022-12-03 DIAGNOSIS — M0609 Rheumatoid arthritis without rheumatoid factor, multiple sites: Secondary | ICD-10-CM

## 2022-12-03 DIAGNOSIS — Z79899 Other long term (current) drug therapy: Secondary | ICD-10-CM

## 2022-12-03 MED ORDER — HUMIRA (2 PEN) 40 MG/0.4ML ~~LOC~~ AJKT: 40.0000 mg | AUTO-INJECTOR | SUBCUTANEOUS | 0 refills | Status: DC

## 2022-12-03 NOTE — Telephone Encounter (Signed)
Refill request received via fax from [facility] for Humira.  Last Fill: 09/19/2022  Labs: 11/07/2022 CMP WNL CBC stable  TB Gold: 11/07/2022 Neg   Next Visit: 04/25/2023  Last Visit: 11/07/2022  [id number] arthritis of multiple sites with negative rheumatoid factor   Current Dose per office note 11/07/2022: Humira 40 mg sq injections every 14 days   Okay to refill Humira?

## 2022-12-11 ENCOUNTER — Ambulatory Visit (INDEPENDENT_AMBULATORY_CARE_PROVIDER_SITE_OTHER): Payer: Medicare HMO | Admitting: Family Medicine

## 2022-12-11 ENCOUNTER — Encounter: Payer: Self-pay | Admitting: Family Medicine

## 2022-12-11 VITALS — BP 142/98 | HR 78 | Temp 98.4°F | Ht 66.0 in | Wt 132.0 lb

## 2022-12-11 DIAGNOSIS — B369 Superficial mycosis, unspecified: Secondary | ICD-10-CM | POA: Insufficient documentation

## 2022-12-11 DIAGNOSIS — H6241 Otitis externa in other diseases classified elsewhere, right ear: Secondary | ICD-10-CM

## 2022-12-11 MED ORDER — CLOTRIMAZOLE 1 % EX SOLN: CUTANEOUS | 0 refills | Status: DC

## 2022-12-11 NOTE — Progress Notes (Signed)
Subjective:  HPI: Terry Guerrero is a 62 y.o. male presenting on 12/11/2022 for Follow-up (crust inside of right ear for about 2 mos - JBG\\\)   HPI Patient is in today for several months of ear itching, draining "white chunks" in AM, and ear canal tenderness Denies fever, chills, body aches, diminished hearing, swimming or submersing.   Review of Systems  All other systems reviewed and are negative.   Relevant past medical history reviewed and updated as indicated.   Past Medical History:  Diagnosis Date   Arthritis    Rheumatoid arthritis(714.0)    Seizures (HCC) 12/2020   Ulcerative colitis (HCC)      Past Surgical History:  Procedure Laterality Date   ANTERIOR CRUCIATE LIGAMENT REPAIR     Left knee   COLONOSCOPY     HERNIA REPAIR  2020   umbilical   TOTAL KNEE ARTHROPLASTY Left 05/02/2022   Procedure: TOTAL KNEE ARTHROPLASTY;  Surgeon: Joen Laura, MD;  Location: [facility];  Service: Orthopedics;  Laterality: Left;    Allergies and medications reviewed and updated.   Current Outpatient Medications:    adalimumab (HUMIRA, 2 PEN,) 40 MG/0.4ML pen, Inject 0.4 mLs (40 mg total) into the skin every 14 (fourteen) days., Disp: 6 each, Rfl: 0   clotrimazole (LOTRIMIN) 1 % external solution, 4 drops in the affected ear 4 times a day for 7 days, Disp: 60 mL, Rfl: 0   IBUPROFEN PO, Take 4 tablets by mouth 2 (two) times daily., Disp: , Rfl:    Oxcarbazepine (TRILEPTAL) 300 MG tablet, Take 2 tablets twice a day (Patient taking differently: Take 600 mg by mouth 2 (two) times daily.), Disp: 120 tablet, Rfl: 11   HYDROcodone-acetaminophen (NORCO) 5-325 MG tablet, Take 1 tablet by mouth every 6 (six) hours as needed for moderate pain. (Patient not taking: Reported on 11/07/2022), Disp: 45 tablet, Rfl: 0  Allergies  Allergen Reactions   Gold-Containing Drug Products Shortness Of Breath and Rash   Lamisil [Terbinafine] Other (See Comments)    The generic made his skin flaky  and red itchy rash   Naprosyn [Naproxen]     Unknown reaction    Oxycodone Nausea And Vomiting    Pt states that he cannot take oxycodone because it causes upset stomach. He is able to tolerate hydrocodone with no problem    Objective:   BP (!) 142/98   Pulse 78   Temp 98.4 F (36.9 C) (Oral)   Ht 5\' 6"  (1.676 m)   Wt 132 lb (59.9 kg)   SpO2 97%   BMI 21.31 kg/m      12/11/2022   11:55 AM 12/11/2022   11:49 AM 11/07/2022    2:31 PM  Vitals with BMI  Height  5\' 6"  5\' 6"   Weight  132 lbs 135 lbs  BMI  21.32 21.8  Systolic 142 140 161  Diastolic 98 98 77  Pulse  78 71     Physical Exam Vitals and nursing note reviewed.  Constitutional:      Appearance: Normal appearance. He is normal weight.  HENT:     Head: Normocephalic and atraumatic.     Right Ear: Drainage and tenderness present.     Ears:     Comments: White, dry debris throughout canal as well as TM Skin:    General: Skin is warm and dry.     Capillary Refill: Capillary refill takes less than 2 seconds.  Neurological:     General: No  focal deficit present.     Mental Status: He is alert and oriented to person, place, and time. Mental status is at baseline.  Psychiatric:        Mood and Affect: Mood normal.        Behavior: Behavior normal.        Thought Content: Thought content normal.        Judgment: Judgment normal.     Assessment & Plan:  Otomycosis of right ear Assessment & Plan: White debris present along ear canal and also present on TM with associated itching, no erythema or edema, consistent with fungal infection. Will treat with Lotrimin ear drops 4 times daily for 7 days. Instructed to keep ear dry. Return to office if symptoms not improved in 7 days.   Other orders -     Clotrimazole; 4 drops in the affected ear 4 times a day for 7 days  Dispense: 60 mL; Refill: 0     Follow up plan: Return if symptoms worsen or fail to improve.  Park Meo, FNP

## 2022-12-11 NOTE — Assessment & Plan Note (Signed)
White debris present along ear canal and also present on TM with associated itching, no erythema or edema, consistent with fungal infection. Will treat with Lotrimin ear drops 4 times daily for 7 days. Instructed to keep ear dry. Return to office if symptoms not improved in 7 days.

## 2023-01-07 ENCOUNTER — Ambulatory Visit: Payer: Medicare HMO | Admitting: Neurology

## 2023-01-07 ENCOUNTER — Encounter: Payer: Self-pay | Admitting: Neurology

## 2023-01-07 VITALS — BP 143/84 | HR 70 | Ht 66.0 in | Wt 137.0 lb

## 2023-01-07 DIAGNOSIS — G40009 Localization-related (focal) (partial) idiopathic epilepsy and epileptic syndromes with seizures of localized onset, not intractable, without status epilepticus: Secondary | ICD-10-CM | POA: Diagnosis not present

## 2023-01-07 MED ORDER — OXCARBAZEPINE 600 MG PO TABS: ORAL_TABLET | ORAL | 3 refills | Status: DC
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
## 2023-01-07 NOTE — Patient Instructions (Signed)
Good to see you.  Increase Oxcarbazepine (Trileptal) to 900mg  twice a day. With your current bottle of Oxcarbazepine 300mg : Take 3 tablets twice a day. Once done, your new bottle will be for Oxcarbazepine 600mg : take 1 and 1/2 tablets twice a day  2. Keep a calendar of the seizures  3. Follow-up in 4 months, call for any changes   Seizure Precautions: 1. If medication has been prescribed for you to prevent seizures, take it exactly as directed.  Do not stop taking the medicine without talking to your doctor first, even if you have not had a seizure in a long time.   2. Avoid activities in which a seizure would cause danger to yourself or to others.  Don't operate dangerous machinery, swim alone, or climb in high or dangerous places, such as on ladders, roofs, or girders.  Do not drive unless your doctor says you may.  3. If you have any warning that you may have a seizure, lay down in a safe place where you can't hurt yourself.    4.  No driving for 6 months from last seizure, as per [facility]   Please refer to the following link on the Epile[employer] of [employer]'s website for more information: http://www.epilepsyfoundation.org/answerplace/Social/driving/drivingu.cfm   5.  Maintain good sleep hygiene. Avoid alcohol.  6.  Contact your doctor if you have any problems that may be related to the medicine you are taking.  7.  Call 911 and bring the patient back to the ED if:        A.  The seizure lasts longer than 5 minutes.       B.  The patient doesn't awaken shortly after the seizure  C.  The patient has new problems such as difficulty seeing, speaking or moving  D.  The patient was injured during the seizure  E.  The patient has a temperature over 102 F (39C)  F.  The patient vomited and now is having trouble breathing

## 2023-02-01 DIAGNOSIS — Z886 Allergy status to analgesic agent status: Secondary | ICD-10-CM | POA: Diagnosis not present

## 2023-02-01 DIAGNOSIS — R269 Unspecified abnormalities of gait and mobility: Secondary | ICD-10-CM | POA: Diagnosis not present

## 2023-02-01 DIAGNOSIS — Z79620 Long term (current) use of immunosuppressive biologic: Secondary | ICD-10-CM | POA: Diagnosis not present

## 2023-02-01 DIAGNOSIS — M1990 Unspecified osteoarthritis, unspecified site: Secondary | ICD-10-CM | POA: Diagnosis not present

## 2023-02-01 DIAGNOSIS — K5190 Ulcerative colitis, unspecified, without complications: Secondary | ICD-10-CM | POA: Diagnosis not present

## 2023-02-01 DIAGNOSIS — Z791 Long term (current) use of non-steroidal anti-inflammatories (NSAID): Secondary | ICD-10-CM | POA: Diagnosis not present

## 2023-02-01 DIAGNOSIS — G40909 Epilepsy, unspecified, not intractable, without status epilepticus: Secondary | ICD-10-CM | POA: Diagnosis not present

## 2023-02-01 DIAGNOSIS — M069 Rheumatoid arthritis, unspecified: Secondary | ICD-10-CM | POA: Diagnosis not present

## 2023-02-01 DIAGNOSIS — M545 Low back pain, unspecified: Secondary | ICD-10-CM | POA: Diagnosis not present

## 2023-02-01 DIAGNOSIS — D84821 Immunodeficiency due to drugs: Secondary | ICD-10-CM | POA: Diagnosis not present

## 2023-02-01 DIAGNOSIS — F17210 Nicotine dependence, cigarettes, uncomplicated: Secondary | ICD-10-CM | POA: Diagnosis not present

## 2023-02-01 DIAGNOSIS — Z91199 Patient's noncompliance with other medical treatment and regimen due to unspecified reason: Secondary | ICD-10-CM | POA: Diagnosis not present

## 2023-02-18 ENCOUNTER — Other Ambulatory Visit: Payer: Self-pay | Admitting: *Deleted

## 2023-02-18 DIAGNOSIS — M0609 Rheumatoid arthritis without rheumatoid factor, multiple sites: Secondary | ICD-10-CM

## 2023-02-18 DIAGNOSIS — Z8719 Personal history of other diseases of the digestive system: Secondary | ICD-10-CM

## 2023-02-18 DIAGNOSIS — Z79899 Other long term (current) drug therapy: Secondary | ICD-10-CM

## 2023-02-18 MED ORDER — HUMIRA (2 PEN) 40 MG/0.4ML ~~LOC~~ AJKT
40.0000 mg | AUTO-INJECTOR | SUBCUTANEOUS | 0 refills | Status: DC
Start: 2023-02-18 — End: 2023-07-11

## 2023-02-18 NOTE — Telephone Encounter (Signed)
Refill request received via fax from [facility] for Humira  Last Fill: 12/03/2022  Labs: 11/07/2022 CMP WNL  CBC stable  TB Gold: 11/07/2022 Neg    Next Visit: 04/25/2023  Last Visit: 11/07/2022  DX: Rheumatoid arthritis of multiple sites with negative rheumatoid factor   Current Dose per office note 11/07/2022: Humira 40 mg sq injections every 14 days.   Left message to advise patient he is due to update labs.   Okay to refill Humira?

## 2023-03-21 ENCOUNTER — Other Ambulatory Visit: Payer: Self-pay | Admitting: Neurology
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
## 2023-04-25 ENCOUNTER — Encounter: Payer: Self-pay | Admitting: Rheumatology

## 2023-04-25 ENCOUNTER — Ambulatory Visit: Payer: Medicare HMO | Attending: Rheumatology | Admitting: Rheumatology

## 2023-04-25 VITALS — BP 109/72 | HR 65 | Resp 14 | Ht 66.0 in | Wt 139.0 lb

## 2023-04-25 DIAGNOSIS — M0609 Rheumatoid arthritis without rheumatoid factor, multiple sites: Secondary | ICD-10-CM

## 2023-04-25 DIAGNOSIS — M79642 Pain in left hand: Secondary | ICD-10-CM

## 2023-04-25 DIAGNOSIS — R55 Syncope and collapse: Secondary | ICD-10-CM

## 2023-04-25 DIAGNOSIS — G40109 Localization-related (focal) (partial) symptomatic epilepsy and epileptic syndromes with simple partial seizures, not intractable, without status epilepticus: Secondary | ICD-10-CM | POA: Diagnosis not present

## 2023-04-25 DIAGNOSIS — S32011D Stable burst fracture of first lumbar vertebra, subsequent encounter for fracture with routine healing: Secondary | ICD-10-CM

## 2023-04-25 DIAGNOSIS — M47816 Spondylosis without myelopathy or radiculopathy, lumbar region: Secondary | ICD-10-CM

## 2023-04-25 DIAGNOSIS — F17200 Nicotine dependence, unspecified, uncomplicated: Secondary | ICD-10-CM

## 2023-04-25 DIAGNOSIS — M79641 Pain in right hand: Secondary | ICD-10-CM

## 2023-04-25 DIAGNOSIS — Z13220 Encounter for screening for lipoid disorders: Secondary | ICD-10-CM

## 2023-04-25 DIAGNOSIS — Z8719 Personal history of other diseases of the digestive system: Secondary | ICD-10-CM

## 2023-04-25 DIAGNOSIS — M79671 Pain in right foot: Secondary | ICD-10-CM

## 2023-04-25 DIAGNOSIS — M79672 Pain in left foot: Secondary | ICD-10-CM

## 2023-04-25 DIAGNOSIS — Z96652 Presence of left artificial knee joint: Secondary | ICD-10-CM

## 2023-04-25 DIAGNOSIS — Z79899 Other long term (current) drug therapy: Secondary | ICD-10-CM | POA: Diagnosis not present

## 2023-04-25 NOTE — Patient Instructions (Addendum)
 Standing Labs We placed an order today for your standing lab work.   Please have your standing labs drawn in April and every 3 months  Please have your labs drawn 2 weeks prior to your appointment so that the provider can discuss your lab results at your appointment, if possible.  Please note that you may see your imaging and lab results in MyChart before we have reviewed them. We will contact you once all results are reviewed. Please allow our office up to 72 hours to thoroughly review all of the results before contacting the office for clarification of your results.  WALK-IN LAB HOURS  Monday through Thursday from 8:00 am -12:30 pm and 1:00 pm-5:00 pm and Friday from 8:00 am-12:00 pm.  Patients with office visits requiring labs will be seen before walk-in labs.  You may encounter longer than normal wait times. Please allow additional time. Wait times may be shorter on  Monday and Thursday afternoons.  We do not book appointments for walk-in labs. We appreciate your patience and understanding with our staff.   Labs are drawn by [facility]. Please bring your co-pay at the time of your lab draw.  You may receive a bill from [facility] for your lab work.  Please note if you are on Hydroxychloroquine and and an order has been placed for a Hydroxychloroquine level,  you will need to have it drawn 4 hours or more after your last dose.  If you wish to have your labs drawn at another location, please call the office 24 hours in advance so we can fax the orders.  The office is located at [address], [place], [place] [address]   If you have any questions regarding directions or hours of operation,  please call [phone].   As a reminder, please drink plenty of water  prior to coming for your lab work. Thanks!   Vaccines You are taking a medication(s) that can suppress your immune system.  The following immunizations are recommended: Flu annually Covid-19  RSV Td/Tdap (tetanus,  diphtheria, pertussis) every 10 years Pneumonia (Prevnar 15 then Pneumovax 23 at least 1 year apart.  Alternatively, can take Prevnar 20 without needing additional dose) Shingrix: 2 doses from 4 weeks to 6 months apart  Please check with your PCP to make sure you are up to date.   If you have signs or symptoms of an infection or start antibiotics: First, call your PCP for workup of your infection. Hold your medication through the infection, until you complete your antibiotics, and until symptoms resolve if you take the following: Injectable medication (Actemra, Benlysta, Cimzia, Cosentyx, Enbrel, Humira , Kevzara, Orencia, Remicade, Simponi, Stelara, Taltz, Tremfya) Methotrexate Leflunomide (Arava) Mycophenolate (Cellcept) Earma, Olumiant, or Rinvoq   Please get an annual skin examination to screen for skin cancer while you are on Humira .  Please use sunscreen and sun protection.  Managing the Challenge of Quitting Smoking Quitting smoking is a physical and mental challenge. You may have cravings, withdrawal symptoms, and temptation to smoke. Before quitting, work with your health care provider to make a plan that can help you manage quitting. Making a plan before you quit may keep you from smoking when you have the urge to smoke while trying to quit. How to manage lifestyle changes Managing stress Stress can make you want to smoke, and wanting to smoke may cause stress. It is important to find ways to manage your stress. You could try some of the following: Practice relaxation techniques. Breathe slowly  and deeply, in through your nose and out through your mouth. Listen to music. Soak in a bath or take a shower. Imagine a peaceful place or vacation. Get some support. Talk with family or friends about your stress. Join a support group. Talk with a counselor or therapist. Get some physical activity. Go for a walk, run, or bike ride. Play a favorite sport. Practice  yoga.  Medicines Talk with your health care provider about medicines that might help you deal with cravings and make quitting easier for you. Relationships Social situations can be difficult when you are quitting smoking. To manage this, you can: Avoid parties and other social situations where people might be smoking. Avoid alcohol . Leave right away if you have the urge to smoke. Explain to your family and friends that you are quitting smoking. Ask for support and let them know you might be a bit grumpy. Plan activities where smoking is not an option. General instructions Be aware that many people gain weight after they quit smoking. However, not everyone does. To keep from gaining weight, have a plan in place before you quit, and stick to the plan after you quit. Your plan should include: Eating healthy snacks. When you have a craving, it may help to: Eat popcorn, or try carrots, celery, or other cut vegetables. Chew sugar-free gum. Changing how you eat. Eat small portion sizes at meals. Eat 4-6 small meals throughout the day instead of 1-2 large meals a day. Be mindful when you eat. You should avoid watching television or doing other things that might distract you as you eat. Exercising regularly. Make time to exercise each day. If you do not have time for a long workout, do short bouts of exercise for 5-10 minutes several times a day. Do some form of strengthening exercise, such as weight lifting. Do some exercise that gets your heart beating and causes you to breathe deeply, such as walking fast, running, swimming, or biking. This is very important. Drinking plenty of water  or other low-calorie or no-calorie drinks. Drink enough fluid to keep your urine pale yellow.  How to recognize withdrawal symptoms Your body and mind may experience discomfort as you try to get used to not having nicotine in your system. These effects are called withdrawal symptoms. They may include: Feeling  hungrier than normal. Having trouble concentrating. Feeling irritable or restless. Having trouble sleeping. Feeling depressed. Craving a cigarette. These symptoms may surprise you, but they are normal to have when quitting smoking. To manage withdrawal symptoms: Avoid places, people, and activities that trigger your cravings. Remember why you want to quit. Get plenty of sleep. Avoid coffee and other drinks that contain caffeine. These may worsen some of your symptoms. How to manage cravings Come up with a plan for how to deal with your cravings. The plan should include the following: A definition of the specific situation you want to deal with. An activity or action you will take to replace smoking. A clear idea for how this action will help. The name of someone who could help you with this. Cravings usually last for 5-10 minutes. Consider taking the following actions to help you with your plan to deal with cravings: Keep your mouth busy. Chew sugar-free gum. Suck on hard candies or a straw. Brush your teeth. Keep your hands and body busy. Change to a different activity right away. Squeeze or play with a ball. Do an activity or a hobby, such as making bead jewelry, practicing needlepoint, or working  with wood. Mix up your normal routine. Take a short exercise break. Go for a quick walk, or run up and down stairs. Focus on doing something kind or helpful for someone else. Call a friend or family member to talk during a craving. Join a support group. Contact a quitline. Where to find support To get help or find a support group: Call the National Cancer Institute's Smoking Quitline: 1-800-QUIT-NOW 256-836-3703) Text QUIT to SmokefreeTXT: 521151 Where to find more information Visit these websites to find more information on quitting smoking: U.S. Department of Health and Human Services: www.smokefree.gov American Lung Association: www.freedomfromsmoking.org Centers for Disease  Control and Prevention (CDC): footballexhibition.com.br American Heart Association: www.heart.org Contact a health care provider if: You want to change your plan for quitting. The medicines you are taking are not helping. Your eating feels out of control or you cannot sleep. You feel depressed or become very anxious. Summary Quitting smoking is a physical and mental challenge. You will face cravings, withdrawal symptoms, and temptation to smoke again. Preparation can help you as you go through these challenges. Try different techniques to manage stress, handle social situations, and prevent weight gain. You can deal with cravings by keeping your mouth busy (such as by chewing gum), keeping your hands and body busy, calling family or friends, or contacting a quitline for people who want to quit smoking. You can deal with withdrawal symptoms by avoiding places where people smoke, getting plenty of rest, and avoiding drinks that contain caffeine. This information is not intended to replace advice given to you by your health care provider. Make sure you discuss any questions you have with your health care provider. Document Revised: 03/31/2021 Document Reviewed: 03/31/2021 Elsevier Patient Education  2024 [employer].

## 2023-04-26 LAB — CBC WITH DIFFERENTIAL/PLATELET
Absolute Lymphocytes: 3465 {cells}/uL (ref 850–3900)
Absolute Monocytes: 735 {cells}/uL (ref 200–950)
Basophils Absolute: 84 {cells}/uL (ref 0–200)
Basophils Relative: 0.8 %
Eosinophils Absolute: 462 {cells}/uL (ref 15–500)
Eosinophils Relative: 4.4 %
HCT: 38.7 % (ref 38.5–50.0)
Hemoglobin: 12.9 g/dL — ABNORMAL LOW (ref 13.2–17.1)
MCH: 32.3 pg (ref 27.0–33.0)
MCHC: 33.3 g/dL (ref 32.0–36.0)
MCV: 97 fL (ref 80.0–100.0)
MPV: 9.6 fL (ref 7.5–12.5)
Monocytes Relative: 7 %
Neutro Abs: 5754 {cells}/uL (ref 1500–7800)
Neutrophils Relative %: 54.8 %
Platelets: 329 10*3/uL (ref 140–400)
RBC: 3.99 10*6/uL — ABNORMAL LOW (ref 4.20–5.80)
RDW: 12.1 % (ref 11.0–15.0)
Total Lymphocyte: 33 %
WBC: 10.5 10*3/uL (ref 3.8–10.8)

## 2023-04-26 LAB — COMPLETE METABOLIC PANEL WITH GFR
AG Ratio: 2 (calc) (ref 1.0–2.5)
ALT: 8 U/L — ABNORMAL LOW (ref 9–46)
AST: 16 U/L (ref 10–35)
Albumin: 4.4 g/dL (ref 3.6–5.1)
Alkaline phosphatase (APISO): 88 U/L (ref 35–144)
BUN/Creatinine Ratio: 13 (calc) (ref 6–22)
BUN: 9 mg/dL (ref 7–25)
CO2: 27 mmol/L (ref 20–32)
Calcium: 9.2 mg/dL (ref 8.6–10.3)
Chloride: 102 mmol/L (ref 98–110)
Creat: 0.67 mg/dL — ABNORMAL LOW (ref 0.70–1.35)
Globulin: 2.2 g/dL (ref 1.9–3.7)
Glucose, Bld: 87 mg/dL (ref 65–99)
Potassium: 5.2 mmol/L (ref 3.5–5.3)
Sodium: 135 mmol/L (ref 135–146)
Total Bilirubin: 0.4 mg/dL (ref 0.2–1.2)
Total Protein: 6.6 g/dL (ref 6.1–8.1)
eGFR: 106 mL/min/{1.73_m2} (ref >=60)

## 2023-04-26 LAB — LIPID PANEL
Cholesterol: 158 mg/dL (ref <200)
HDL: 52 mg/dL (ref >=40)
LDL Cholesterol (Calc): 82 mg/dL
Non-HDL Cholesterol (Calc): 106 mg/dL (ref <130)
Total CHOL/HDL Ratio: 3 (calc) (ref <5.0)
Triglycerides: 140 mg/dL (ref <150)

## 2023-04-26 NOTE — Progress Notes (Signed)
 Hemoglobin is low at 12.9 and stable.  CMP is normal.  Lipid panel is within normal limits.  Please forward results to his PCP.

## 2023-05-10 ENCOUNTER — Ambulatory Visit: Payer: Medicare HMO | Admitting: Neurology

## 2023-05-10 ENCOUNTER — Encounter: Payer: Self-pay | Admitting: Neurology

## 2023-05-10 VITALS — BP 121/77 | HR 90 | Ht 66.5 in | Wt 138.4 lb

## 2023-05-10 DIAGNOSIS — G40009 Localization-related (focal) (partial) idiopathic epilepsy and epileptic syndromes with seizures of localized onset, not intractable, without status epilepticus: Secondary | ICD-10-CM | POA: Diagnosis not present

## 2023-05-10 MED ORDER — OXCARBAZEPINE 600 MG PO TABS: ORAL_TABLET | ORAL | 3 refills | Status: DC

## 2023-05-10 MED ORDER — VALTOCO 15 MG DOSE 7.5 MG/0.1ML NA LQPK: NASAL | 5 refills | Status: DC
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
## 2023-05-10 NOTE — Patient Instructions (Addendum)
Good to see you.  Increase Oxcarbazepine 600mg : Take 2 tablets twice a day  2. A prescription for Valtoco nasal spray has been sent to [facility].   3. Contact your heart doctor Dr. Ladona Ridgel about the passing out [phone])  4. Continue seizure calendar, follow-up in 3-4 months, call for any changes   Seizure Precautions: 1. If medication has been prescribed for you to prevent seizures, take it exactly as directed.  Do not stop taking the medicine without talking to your doctor first, even if you have not had a seizure in a long time.   2. Avoid activities in which a seizure would cause danger to yourself or to others.  Don't operate dangerous machinery, swim alone, or climb in high or dangerous places, such as on ladders, roofs, or girders.  Do not drive unless your doctor says you may.  3. If you have any warning that you may have a seizure, lay down in a safe place where you can't hurt yourself.    4.  No driving for 6 months from last seizure, as per [facility].   Please refer to the following link on the Epilepsy Foundation of [employer]'s website for more information: http://www.epilepsyfoundation.org/answerplace/Social/driving/drivingu.cfm   5.  Maintain good sleep hygiene. Avoid alcohol  6.  Contact your doctor if you have any problems that may be related to the medicine you are taking.  7.  Call 911 and bring the patient back to the ED if:        A.  The seizure lasts longer than 5 minutes.       B.  The patient doesn't awaken shortly after the seizure  C.  The patient has new problems such as difficulty seeing, speaking or moving  D.  The patient was injured during the seizure  E.  The patient has a temperature over 102 F (39C)  F.  The patient vomited and now is having trouble breathing

## 2023-05-13 ENCOUNTER — Telehealth: Payer: Self-pay | Admitting: Cardiology

## 2023-05-13 ENCOUNTER — Encounter: Payer: Self-pay | Admitting: Internal Medicine

## 2023-05-13 ENCOUNTER — Ambulatory Visit: Payer: Medicare HMO | Attending: Internal Medicine | Admitting: Internal Medicine

## 2023-05-13 VITALS — BP 132/80 | HR 68 | Ht 66.0 in | Wt 140.6 lb

## 2023-05-13 DIAGNOSIS — R55 Syncope and collapse: Secondary | ICD-10-CM | POA: Diagnosis not present

## 2023-05-13 NOTE — Telephone Encounter (Signed)
Patient is coming today to see Dr.taylor

## 2023-05-13 NOTE — Progress Notes (Signed)
HPI Mr. Rovner returns today for followup of syncope. He is a pleasant 63 yo man with sinus node dysfunction who has had daytime pauses of almost 5 seconds. He initially refused PPM insertion. Since his last visit over a year ago he notes spells which his SO has filmed which shows what appears to be partial seizure activity. But he also has episodes where he suddenly goes unconscious for a few seconds. He feels immediately well after her awakens.  Allergies  Allergen Reactions   Gold-Containing Drug Products Shortness Of Breath and Rash   Lamisil [Terbinafine] Other (See Comments)    The generic made his skin flaky and red itchy rash   Naprosyn [Naproxen]     Unknown reaction    Oxycodone Nausea And Vomiting    Pt states that he cannot take oxycodone because it causes upset stomach. He is able to tolerate hydrocodone with no problem     Current Outpatient Medications  Medication Sig Dispense Refill   adalimumab (HUMIRA, 2 PEN,) 40 MG/0.4ML pen Inject 0.4 mLs (40 mg total) into the skin every 14 (fourteen) days. 6 each 0   diazePAM, 15 MG Dose, (VALTOCO 15 MG DOSE) 2 x 7.5 MG/0.1ML LQPK Administer 1 spray in 1 nostril, second spray in other nostril (one dose) for seizure longer than 5 minutes. May use second dose after 4 hours if needed 5 each 5   IBUPROFEN PO Take 4 tablets by mouth 2 (two) times daily.     oxcarbazepine (TRILEPTAL) 600 MG tablet Take 2 tablets (1200mg ) twice a day 360 tablet 3   No current facility-administered medications for this visit.     Past Medical History:  Diagnosis Date   Arthritis    Rheumatoid arthritis(714.0)    Seizures (HCC) 12/2020   Ulcerative colitis (HCC)     ROS:   All systems reviewed and negative except as noted in the HPI.   Past Surgical History:  Procedure Laterality Date   ANTERIOR CRUCIATE LIGAMENT REPAIR     Left knee   COLONOSCOPY     HERNIA REPAIR  2020   umbilical   MOUTH SURGERY     Dentures   TOTAL KNEE  ARTHROPLASTY Left 05/02/2022   Procedure: TOTAL KNEE ARTHROPLASTY;  Surgeon: Joen Laura, MD;  Location: [facility];  Service: Orthopedics;  Laterality: Left;     Family History  Problem Relation Age of Onset   Alcoholism Mother    Colon cancer Neg Hx    Esophageal cancer Neg Hx    Stomach cancer Neg Hx    Rectal cancer Neg Hx      Social History   Socioeconomic History   Marital status: Divorced    Spouse name: Not on file   Number of children: 1   Years of education: Not on file   Highest education level: 12th grade  Occupational History   Occupation: Disabled   Tobacco Use   Smoking status: Every Day    Current packs/day: 0.75    Average packs/day: 0.8 packs/day for 25.5 years (19.1 ttl pk-yrs)    Types: Cigarettes    Start date: 05/20/2022    Passive exposure: Never   Smokeless tobacco: Former  Building services engineer status: Never Used  Substance and Sexual Activity   Alcohol use: Yes    Comment: occ   Drug use: Yes    Types: Marijuana    Comment: marijuana daily   Sexual activity: Yes  Birth control/protection: None  Other Topics Concern   Not on file  Social History Narrative   Daily caffeine    Right handed    Lives with girlfriend. One story home   1 son.    Social Drivers of Health   Financial Resource Strain: Medium Risk (09/03/2022)   Overall Financial Resource Strain (CARDIA)    Difficulty of Paying Living Expenses: Somewhat hard  Food Insecurity: Food Insecurity Present (09/03/2022)   Hunger Vital Sign    Worried About Running Out of Food in the Last Year: Sometimes true    Ran Out of Food in the Last Year: Sometimes true  Transportation Needs: No Transportation Needs (09/03/2022)   PRAPARE - Administrator, Civil Service (Medical): No    Lack of Transportation (Non-Medical): No  Physical Activity: Sufficiently Active (09/03/2022)   Exercise Vital Sign    Days of Exercise per Week: 7 days    Minutes of Exercise per Session: 90  min  Stress: No Stress Concern Present (09/03/2022)   [employer] of Occupational Health - Occupational Stress Questionnaire    Feeling of Stress : Only a little  Social Connections: Unknown (09/03/2022)   Social Connection and Isolation Panel [NHANES]    Frequency of Communication with Friends and Family: More than three times a week    Frequency of Social Gatherings with Friends and Family: Once a week    Attends Religious Services: Patient declined    Database administrator or Organizations: No    Attends Banker Meetings: Never    Marital Status: Divorced  Catering manager Violence: Not At Risk (05/17/2022)   Humiliation, Afraid, Rape, and Kick questionnaire    Fear of Current or Ex-Partner: No    Emotionally Abused: No    Physically Abused: No    Sexually Abused: No     BP 132/80   Ht 5\' 6"  (1.676 m)   Wt 140 lb 9.6 oz (63.8 kg)   SpO2 98%   BMI 22.69 kg/m   Physical Exam:  Well appearing NAD HEENT: Unremarkable Neck:  No JVD, no thyromegally Lymphatics:  No adenopathy Back:  No CVA tenderness Lungs:  Clear with no wheezes HEART:  Regular rate rhythm, no murmurs, no rubs, no clicks Abd:  soft, positive bowel sounds, no organomegally, no rebound, no guarding Ext:  2 plus pulses, no edema, no cyanosis, no clubbing Skin:  No rashes no nodules Neuro:  CN II through XII intact, motor grossly intact   Assess/Plan: Sinus node dysfunction - he has had symptomatic pauses of almost 5 seconds and these appear to be worsening and I have recommended he undergo PPM insertion.  Partial seizure - He is on keppra. I will defer to his neurologist.  Dorathy Daft

## 2023-05-13 NOTE — Telephone Encounter (Signed)
Patient c/o Palpitations:  STAT if patient reporting lightheadedness, shortness of breath, or chest pain  How long have you had palpitations/irregular HR/ Afib? Are you having the symptoms now? Episode he said where his heart is stopping , also episodes of heart racing  Are you currently experiencing lightheadedness, SOB or CP? no  Do you have a history of afib (atrial fibrillation) or irregular heart rhythm?   Have you checked your BP or HR? (document readings if available):   Are you experiencing any other symptoms?  He blacks out when  these episodes, he does not black out all the time, when this happen- he has an appointment for tomorrow -

## 2023-05-13 NOTE — Patient Instructions (Signed)
Medication Instructions:  Your physician recommends that you continue on your current medications as directed. Please refer to the Current Medication list given to you today.  Dates for Pacemaker 1/28, 2/3 and 2/5   *If you need a refill on your cardiac medications before your next appointment, please call your pharmacy*   Lab Work:  If you have labs (blood work) drawn today and your tests are completely normal, you will receive your results only by: MyChart Message (if you have MyChart) OR A paper copy in the mail If you have any lab test that is abnormal or we need to change your treatment, we will call you to review the results.   Testing/Procedures: Your physician has recommended that you have a pacemaker inserted. A pacemaker is a small device that is placed under the skin of your chest or abdomen to help control abnormal heart rhythms. This device uses electrical pulses to prompt the heart to beat at a normal rate. Pacemakers are used to treat heart rhythms that are too slow. Wire (leads) are attached to the pacemaker that goes into the chambers of you heart. This is done in the hospital and usually requires and overnight stay. Please see the instruction sheet given to you today for more information.    Follow-Up: At [facility], you and your health needs are our priority.  As part of our continuing mission to provide you with exceptional heart care, we have created designated Provider Care Teams.  These Care Teams include your primary Cardiologist (physician) and Advanced Practice Providers (APPs -  Physician Assistants and Nurse Practitioners) who all work together to provide you with the care you need, when you need it.  We recommend signing up for the patient portal called "MyChart".  Sign up information is provided on this After Visit Summary.  MyChart is used to connect with patients for Virtual Visits (Telemedicine).  Patients are able to view lab/test results,  encounter notes, upcoming appointments, etc.  Non-urgent messages can be sent to your provider as well.   To learn more about what you can do with MyChart, go to [email or web address].    Your next appointment:    Pending   Provider:   Lewayne Bunting, MD    Other Instructions Thank you for choosing [facility]!

## 2023-05-14 ENCOUNTER — Ambulatory Visit: Payer: Medicare HMO | Admitting: Cardiology

## 2023-05-16 ENCOUNTER — Telehealth: Payer: Self-pay | Admitting: Neurology

## 2023-05-16 ENCOUNTER — Ambulatory Visit: Payer: Self-pay | Admitting: Family Medicine

## 2023-05-16 NOTE — Telephone Encounter (Unsigned)
Copied from CRM [id number]. Topic: Clinical - Red Word Triage >> May 16, 2023  2:57 PM Terry Guerrero wrote: Red Word that prompted transfer to Nurse Triage: Blood pressure 144/88 no dizziness right now but was earlier. It happens after taking his seizure medication oxcarbazepine (TRILEPTAL) 600 MG tablet

## 2023-05-16 NOTE — Telephone Encounter (Signed)
Pt called he stated that Tuesday he had high blood pressure, dizzy spells with eyes jerking back and forth and had to go lay down, yesterday he was fine, today his BP is 144/88 high blood pressure he said for him, dizzy spells with eyes jerking back and forth and had to go lay down, he said it didn't feel like a seizure.

## 2023-05-16 NOTE — Telephone Encounter (Signed)
Pls have him go back down to 900mg  twice a day. Once he is feeling better, we will plan to add a different medication since he cannot tolerate higher dose of oxcarbazepine. Thanks

## 2023-05-16 NOTE — Telephone Encounter (Signed)
Chief Complaint: dizziness Symptoms: high BP readings with no hx of HTN, intermittent dizziness spells for the last couple of weeks that has been continuous for two days Frequency: dizzy spells for a couple of weeks. Continuous dizziness for two days Pertinent Negatives: Patient denies fever, CP, SOB Disposition: [x] ED /[] Urgent Care (no appt availability in office) / [] Appointment(In office/virtual)/ []  [place] [facility]/ [] Home Care/ [] Refused Recommended Disposition /[] [facility]/ []  Follow-up with PCP Additional Notes: pt with hx of seizures called with concerns for high blood pressure. Patient states BP is 144/87-concerned because BP is never that high. Patient states his oxcarbazepine was recently increased from 900 mg to 1200 mg-patient states he has noticed he becomes dizzy 30 mins after taking his medications. Patient endorses having dizzy spells for the last couple of weeks. Patient states that he has had two days of continuous dizziness to the point that patient has to lay down in his bed for a long period of time. Per protocol, recommendation is to be seen in the ED for evaluation. Patient verbalized understanding of plan and all questions answered.   Reason for Disposition  Patient sounds very sick or weak to the triager  [1] Caller has NON-URGENT medicine question about med that PCP prescribed AND [2] triager unable to answer question  [1] Systolic BP  >= 130 OR Diastolic >= 80 AND [2] not taking BP medications  Answer Assessment - Initial Assessment Questions 1. BLOOD PRESSURE: "What is the blood pressure?" "Did you take at least two measurements 5 minutes apart?"     144/87 2. ONSET: "When did you take your blood pressure?"     In the morning prior to taking seizure medication 3. HOW: "How did you take your blood pressure?" (e.g., automatic home BP monitor, visiting nurse)     Automatic BP monitor 4. HISTORY: "Do you have a history of high blood pressure?"      No 5. MEDICINES: "Are you taking any medicines for blood pressure?" "Have you missed any doses recently?"     No 6. OTHER SYMPTOMS: "Do you have any symptoms?" (e.g., blurred vision, chest pain, difficulty breathing, headache, weakness)     Dizziness about 30 minutes after taking seizure medication. Currently no dizziness  Answer Assessment - Initial Assessment Questions 1. NAME of MEDICINE: "What medicine(s) are you calling about?"     Oxcarbazepine-pt was recently increased to 1200 mg. Increased from 900 mg to 1200 mg on 05/10/2023 3. PRESCRIBER: "Who prescribed the medicine?" Reason: if prescribed by specialist, call should be referred to that group.     Neurologist 4. SYMPTOMS: "Do you have any symptoms?" If Yes, ask: "What symptoms are you having?"  "How bad are the symptoms (e.g., mild, moderate, severe)     Dizziness that started 30 mins after took seizure medication. Dizziness has been intermittent over the last two days. Patient endorses dizziness spells that have been going on for a couple of weeks.  Answer Assessment - Initial Assessment Questions 1. DESCRIPTION: "Describe your dizziness."     Starts 30 mins after taking seizure medication-I have to go lay down 2. LIGHTHEADED: "Do you feel lightheaded?" (e.g., somewhat faint, woozy, weak upon standing)     yes 3. VERTIGO: "Do you feel like either you or the room is spinning or tilting?" (i.e. vertigo)     Room is spinning 4. SEVERITY: "How bad is it?"  "Do you feel like you are going to faint?" "Can you stand and walk?"   -  MILD: Feels slightly dizzy, but walking normally.   - MODERATE: Feels unsteady when walking, but not falling; interferes with normal activities (e.g., school, work).   - SEVERE: Unable to walk without falling, or requires assistance to walk without falling; feels like passing out now.      Moderate 5. ONSET:  "When did the dizziness begin?"     Dizzy spells have been going on for a couple of weeks but have had  two solid days of dizziness in the last couple of days 6. AGGRAVATING FACTORS: "Does anything make it worse?" (e.g., standing, change in head position)     standing 7. HEART RATE: "Can you tell me your heart rate?" "How many beats in 15 seconds?"  (Note: not all patients can do this)       Unable to  8. CAUSE: "What do you think is causing the dizziness?"     unsure 9. RECURRENT SYMPTOM: "Have you had dizziness before?" If Yes, ask: "When was the last time?" "What happened that time?"     Dizzy spells have been going on for a couple of weeks which the spells haven't lasted long 10. OTHER SYMPTOMS: "Do you have any other symptoms?" (e.g., fever, chest pain, vomiting, diarrhea, bleeding)       No  Protocols used: Blood Pressure - High-A-AH, Medication Question Call-A-AH, Dizziness - Lightheadedness-A-AH

## 2023-05-16 NOTE — Telephone Encounter (Signed)
Back up phone number is [phone]

## 2023-05-16 NOTE — Telephone Encounter (Signed)
Pt called in stating since Dr. Karel Jarvis raised his oxcarbazepine he has been having dizzy spells and his blood pressure has been elevated. He called his PCP and their triage nurse told him to go to the emergency room.

## 2023-05-16 NOTE — Telephone Encounter (Unsigned)
Copied from CRM [id number]. Topic: Clinical - Red Word Triage >> May 16, 2023  3:03 PM Terry Guerrero wrote: Red Word that prompted transfer to Nurse Triage: Blood pressure 144/88 no dizziness right now but was earlier. It happens after taking his seizure medication oxcarbazepine (TRILEPTAL) 600 MG tablet

## 2023-05-17 ENCOUNTER — Telehealth: Payer: Self-pay | Admitting: Internal Medicine

## 2023-05-17 DIAGNOSIS — I495 Sick sinus syndrome: Secondary | ICD-10-CM

## 2023-05-17 NOTE — Telephone Encounter (Signed)
Pt called back no answer voice mail full

## 2023-05-17 NOTE — Telephone Encounter (Signed)
  Pt is calling to schedule PPM implant procedure. He saw Dr. Ladona Ridgel 05/13/23 and he was told to call us to set up the appointment

## 2023-05-17 NOTE — Telephone Encounter (Addendum)
Called spoke w/pt re: high bp msg. Relay pcp's msg to pt,"That blood pressure is slightly high, but not emergent.  Needs a regular office visit.  Does not need to go to er ."      Pt is aware and voiced understanding. However, per pt stated that last night his bp did come down and he is better today. Per pt stated that he believes that the high bp reading was all due to stress/or stress related, he has a lot going on currently. Much better today. Pt stated does not need appt as of right now. Told pt that should he needs anything further, to contact our office. Pt stated,"ok."

## 2023-05-20 NOTE — Telephone Encounter (Signed)
Patient is calling back to see if he could have the PPM implant procedure done on 05/29/23. Please call back to confirm

## 2023-05-20 NOTE — Telephone Encounter (Signed)
Returned call to pt to notify that PPM implant is scheduled for 05/29/23. No answer, left msg to call back.

## 2023-05-20 NOTE — Telephone Encounter (Signed)
Patients is returning call, he states if he doesn't answer just please leave date and time. Please advise

## 2023-05-21 ENCOUNTER — Encounter: Payer: Self-pay | Admitting: *Deleted

## 2023-05-21 ENCOUNTER — Other Ambulatory Visit (HOSPITAL_COMMUNITY)
Admission: RE | Admit: 2023-05-21 | Discharge: 2023-05-21 | Disposition: A | Discharge status: Home / Self Care | Payer: Medicare HMO | Source: Ambulatory Visit | Attending: Internal Medicine | Admitting: Internal Medicine

## 2023-05-21 DIAGNOSIS — I495 Sick sinus syndrome: Secondary | ICD-10-CM | POA: Diagnosis not present

## 2023-05-21 LAB — CBC
HCT: 39.8 % (ref 39.0–52.0)
Hemoglobin: 13.4 g/dL (ref 13.0–17.0)
MCH: 32.2 pg (ref 26.0–34.0)
MCHC: 33.7 g/dL (ref 30.0–36.0)
MCV: 95.7 fL (ref 80.0–100.0)
Platelets: 323 10*3/uL (ref 150–400)
RBC: 4.16 MIL/uL — ABNORMAL LOW (ref 4.22–5.81)
RDW: 14.2 % (ref 11.5–15.5)
WBC: 9.6 10*3/uL (ref 4.0–10.5)
nRBC: 0 % (ref 0.0–0.2)

## 2023-05-21 LAB — BASIC METABOLIC PANEL
Anion gap: 9 (ref 5–15)
BUN: 10 mg/dL (ref 8–23)
CO2: 22 mmol/L (ref 22–32)
Calcium: 9 mg/dL (ref 8.9–10.3)
Chloride: 103 mmol/L (ref 98–111)
Creatinine, Ser: 0.71 mg/dL (ref 0.61–1.24)
GFR, Estimated: >60 mL/min (ref >60)
Glucose, Bld: 91 mg/dL (ref 70–99)
Potassium: 4.1 mmol/L (ref 3.5–5.1)
Sodium: 134 mmol/L — ABNORMAL LOW (ref 135–145)

## 2023-05-21 NOTE — Telephone Encounter (Signed)
Returned call to pt. No answer. Left msg to call back.

## 2023-05-21 NOTE — Telephone Encounter (Signed)
Patient is returning call.

## 2023-05-22 NOTE — Telephone Encounter (Signed)
Noted, thanks!

## 2023-05-22 NOTE — Telephone Encounter (Signed)
Pt called an informed go back down to 900mg  twice a day. Once he is feeling better, we will plan to add a different medication since he cannot tolerate higher dose of oxcarbazepine next week pt is going to have surgery having a pacemaker placed

## 2023-05-23 ENCOUNTER — Ambulatory Visit: Payer: Medicare HMO | Admitting: *Deleted

## 2023-05-23 DIAGNOSIS — Z0000 Encounter for general adult medical examination without abnormal findings: Secondary | ICD-10-CM

## 2023-05-23 NOTE — Progress Notes (Signed)
Subjective:   Terry Guerrero is a 63 y.o. male who presents for Medicare Annual/Subsequent preventive examination.  Visit Complete: Virtual I connected with  Orlene Och on 05/23/23 by a audio enabled telemedicine application and verified that I am speaking with the correct person using two identifiers.  Patient Location: Home  Provider Location: Home Office  I discussed the limitations of evaluation and management by telemedicine. The patient expressed understanding and agreed to proceed.  Vital Signs: Because this visit was a virtual/telehealth visit, some criteria may be missing or patient reported. Any vitals not documented were not able to be obtained and vitals that have been documented are patient reported.   Cardiac Risk Factors include: advanced age (>70men, >16 women);male gender;smoking/ tobacco exposure     Objective:    There were no vitals filed for this visit. There is no height or weight on file to calculate BMI.     05/23/2023    9:05 AM 05/10/2023    3:35 PM 01/07/2023    3:40 PM 08/20/2022    7:41 AM 07/06/2022    3:15 PM 05/17/2022    9:16 AM 04/20/2022   11:02 AM  Advanced Directives  Does Patient Have a Medical Advance Directive? No No No No No No No  Would patient like information on creating a medical advance directive? No - Patient declined   No - Patient declined  No - Patient declined     Current Medications (verified) Outpatient Encounter Medications as of 05/23/2023  Medication Sig   adalimumab (HUMIRA, 2 PEN,) 40 MG/0.4ML pen Inject 0.4 mLs (40 mg total) into the skin every 14 (fourteen) days.   diazePAM, 15 MG Dose, (VALTOCO 15 MG DOSE) 2 x 7.5 MG/0.1ML LQPK Administer 1 spray in 1 nostril, second spray in other nostril (one dose) for seizure longer than 5 minutes. May use second dose after 4 hours if needed   IBUPROFEN PO Take 4 tablets by mouth 2 (two) times daily.   oxcarbazepine (TRILEPTAL) 600 MG tablet Take 2 tablets (1200mg ) twice a day    No facility-administered encounter medications on file as of 05/23/2023.    Allergies (verified) Gold-containing drug products, Lamisil [terbinafine], Naprosyn [naproxen], and Oxycodone   History: Past Medical History:  Diagnosis Date   Arthritis    Rheumatoid arthritis(714.0)    Seizures (HCC) 12/2020   Ulcerative colitis (HCC)    Past Surgical History:  Procedure Laterality Date   ANTERIOR CRUCIATE LIGAMENT REPAIR     Left knee   COLONOSCOPY     HERNIA REPAIR  2020   umbilical   MOUTH SURGERY     Dentures   TOTAL KNEE ARTHROPLASTY Left 05/02/2022   Procedure: TOTAL KNEE ARTHROPLASTY;  Surgeon: Joen Laura, MD;  Location: [facility];  Service: Orthopedics;  Laterality: Left;   Family History  Problem Relation Age of Onset   Alcoholism Mother    Colon cancer Neg Hx    Esophageal cancer Neg Hx    Stomach cancer Neg Hx    Rectal cancer Neg Hx    Social History   Socioeconomic History   Marital status: Divorced    Spouse name: Not on file   Number of children: 1   Years of education: Not on file   Highest education level: 12th grade  Occupational History   Occupation: Disabled   Tobacco Use   Smoking status: Every Day    Current packs/day: 0.75    Average packs/day: 0.8 packs/day for 25.5 years (  19.1 ttl pk-yrs)    Types: Cigarettes    Start date: 05/20/2022    Passive exposure: Never   Smokeless tobacco: Former  Building services engineer status: Never Used  Substance and Sexual Activity   Alcohol use: Yes    Comment: occ   Drug use: Yes    Types: Marijuana    Comment: marijuana daily   Sexual activity: Yes    Birth control/protection: None  Other Topics Concern   Not on file  Social History Narrative   Daily caffeine    Right handed    Lives with girlfriend. One story home   1 son.    Social Drivers of Health   Financial Resource Strain: Medium Risk (05/23/2023)   Overall Financial Resource Strain (CARDIA)    Difficulty of Paying Living  Expenses: Somewhat hard  Food Insecurity: Food Insecurity Present (05/23/2023)   Hunger Vital Sign    Worried About Running Out of Food in the Last Year: Sometimes true    Ran Out of Food in the Last Year: Sometimes true  Transportation Needs: No Transportation Needs (05/23/2023)   PRAPARE - Administrator, Civil Service (Medical): No    Lack of Transportation (Non-Medical): No  Physical Activity: Sufficiently Active (05/23/2023)   Exercise Vital Sign    Days of Exercise per Week: 7 days    Minutes of Exercise per Session: 90 min  Stress: No Stress Concern Present (05/23/2023)   [employer] of Occupational Health - Occupational Stress Questionnaire    Feeling of Stress : Only a little  Social Connections: Unknown (05/23/2023)   Social Connection and Isolation Panel [NHANES]    Frequency of Communication with Friends and Family: More than three times a week    Frequency of Social Gatherings with Friends and Family: Once a week    Attends Religious Services: Patient declined    Database administrator or Organizations: No    Attends Engineer, structural: Never    Marital Status: Divorced    Tobacco Counseling Ready to quit: Not Answered Counseling given: Not Answered   Clinical Intake:  Pre-visit preparation completed: Yes  Pain : No/denies pain     Diabetes: No  How often do you need to have someone help you when you read instructions, pamphlets, or other written materials from your doctor or pharmacy?: 1 - Never  Interpreter Needed?: No  Information entered by :: Remi Haggard LPN   Activities of Daily Living    05/23/2023    9:05 AM 05/16/2023    9:07 AM  In your present state of health, do you have any difficulty performing the following activities:  Hearing? 0 0  Vision? 0 0  Difficulty concentrating or making decisions? 0 0  Walking or climbing stairs? 0 0  Dressing or bathing? 0 0  Doing errands, shopping? 1 1  Preparing Food and eating  ? N N  Using the Toilet? N N  In the past six months, have you accidently leaked urine? N N  Do you have problems with loss of bowel control? N N  Managing your Medications? N N  Managing your Finances? N N  Housekeeping or managing your Housekeeping? N N    Patient Care Team: Donita Brooks, MD as PCP - General (Family Medicine) Van Clines, MD as Consulting Physician (Neurology) Pollyann Savoy, MD as Consulting Physician (Rheumatology) Joen Laura, MD as Consulting Physician (Orthopedic Surgery) Olegario Shearer, MD as Referring Physician (  Dermatology)  Indicate any recent Medical Services you may have received from other than [facility] providers in the past year (date may be approximate).     Assessment:   This is a routine wellness examination for Baker.  Hearing/Vision screen Hearing Screening - Comments:: No trouble hearing Vision Screening - Comments:: Not up to date    Goals Addressed             This Visit's Progress    Increase physical activity   Not on track    Patient Stated       Quit smoking        Depression Screen    05/23/2023    9:08 AM 05/17/2022    9:18 AM 05/12/2021    9:03 AM 01/03/2021   12:18 PM 07/19/2017    9:01 AM  PHQ 2/9 Scores  PHQ - 2 Score 2 0 0 0 0  PHQ- 9 Score 4        Fall Risk    05/23/2023    9:02 AM 05/16/2023    9:07 AM 05/10/2023    3:34 PM 01/07/2023    3:40 PM 07/06/2022    3:15 PM  Fall Risk   Falls in the past year? 1 1 1 1  0  Number falls in past yr: 1 1 1 1  0  Injury with Fall? 0 0 0 0 0  Risk for fall due to : Other (Comment)      Risk for fall due to: Comment having pacemaker put in was due to blacking out      Follow up Falls evaluation completed;Education provided;Falls prevention discussed  Falls evaluation completed Falls evaluation completed Falls evaluation completed    MEDICARE RISK AT HOME: Medicare Risk at Home Any stairs in or around the home?: Yes If so, are there any without  handrails?: No Home free of loose throw rugs in walkways, pet beds, electrical cords, etc?: Yes Adequate lighting in your home to reduce risk of falls?: Yes Life alert?: No Use of a cane, walker or w/c?: No Grab bars in the bathroom?: No Shower chair or bench in shower?: No Elevated toilet seat or a handicapped toilet?: No  TIMED UP AND GO:  Was the test performed?  No    Cognitive Function:        05/23/2023    9:07 AM 05/17/2022    9:17 AM 05/12/2021    9:10 AM  6CIT Screen  What Year? 0 points 0 points 0 points  What month? 0 points 0 points 0 points  What time? 0 points 0 points 0 points  Count back from 20 0 points 0 points 0 points  Months in reverse 0 points 0 points 2 points  Repeat phrase 6 points 0 points 0 points  Total Score 6 points 0 points 2 points    Immunizations Immunization History  Administered Date(s) Administered   Tdap 08/19/2022    TDAP status: Up to date  Flu Vaccine status: Due, Education has been provided regarding the importance of this vaccine. Advised may receive this vaccine at local pharmacy or Health Dept. Aware to provide a copy of the vaccination record if obtained from local pharmacy or Health Dept. Verbalized acceptance and understanding.  Pneumococcal vaccine status: Due, Education has been provided regarding the importance of this vaccine. Advised may receive this vaccine at local pharmacy or Health Dept. Aware to provide a copy of the vaccination record if obtained from local pharmacy or Health Dept. Verbalized acceptance  and understanding.  Covid-19 vaccine status: Information provided on how to obtain vaccines.   Qualifies for Shingles Vaccine? Yes   Zostavax completed No   Shingrix Completed?: No.    Education has been provided regarding the importance of this vaccine. Patient has been advised to call insurance company to determine out of pocket expense if they have not yet received this vaccine. Advised may also receive vaccine  at local pharmacy or Health Dept. Verbalized acceptance and understanding.  Screening Tests Health Maintenance  Topic Date Due   Pneumococcal Vaccine 76-18 Years old (1 of 2 - PCV) Never done   Colonoscopy  05/13/2024   Medicare Annual Wellness (AWV)  05/22/2024   DTaP/Tdap/Td (2 - Td or Tdap) 08/18/2032   Hepatitis C Screening  Completed   HPV VACCINES  Aged Out   Lung Cancer Screening  Discontinued   INFLUENZA VACCINE  Discontinued   COVID-19 Vaccine  Discontinued   HIV Screening  Discontinued   Zoster Vaccines- Shingrix  Discontinued    Health Maintenance  Health Maintenance Due  Topic Date Due   Pneumococcal Vaccine 68-65 Years old (1 of 2 - PCV) Never done    Colorectal cancer screening: Type of screening: Colonoscopy. Completed 2021. Repeat every 5 years  Lung Cancer Screening: (Low Dose CT Chest recommended if Age 92-80 years, 20 pack-year currently smoking OR have quit w/in 15years.)     Lung Cancer Screening Referral:   Additional Screening:  Hepatitis C Screening:  Completed 2023  Vision Screening: Recommended annual ophthalmology exams for early detection of glaucoma and other disorders of the eye. Is the patient up to date with their annual eye exam?  No Who is the provider or what is the name of the office in which the patient attends annual eye exams? Education provided If pt is not established with a provider, would they like to be referred to a provider to establish care? No .   Dental Screening: Recommended annual dental exams for proper oral hygiene    Community Resource Referral / Chronic Care Management: CRR required this visit?  No   CCM required this visit?  No     Plan:     I have personally reviewed and noted the following in the patient's chart:   Medical and social history Use of alcohol, tobacco or illicit drugs  Current medications and supplements including opioid prescriptions. Patient is not currently taking opioid  prescriptions. Functional ability and status Nutritional status Physical activity Advanced directives List of other physicians Hospitalizations, surgeries, and ER visits in previous 12 months Vitals Screenings to include cognitive, depression, and falls Referrals and appointments  In addition, I have reviewed and discussed with patient certain preventive protocols, quality metrics, and best practice recommendations. A written personalized care plan for preventive services as well as general preventive health recommendations were provided to patient.     Remi Haggard, LPN   9/60/4540   After Visit Summary: (MyChart) Due to this being a telephonic visit, the after visit summary with patients personalized plan was offered to patient via MyChart   Nurse Notes:

## 2023-05-28 NOTE — Pre-Procedure Instructions (Signed)
Attempted to call patient regarding procedure instructions for tomorrow.  Left voicemail on the following items: Arrival time 0630 Nothing to eat or drink after midnight No meds AM of procedure Responsible person to drive you home and stay with you for 24 hrs Wash with special soap night before and morning of procedure  

## 2023-05-29 ENCOUNTER — Other Ambulatory Visit: Payer: Self-pay

## 2023-05-29 ENCOUNTER — Encounter (HOSPITAL_COMMUNITY): Payer: Self-pay | Admitting: Internal Medicine

## 2023-05-29 ENCOUNTER — Ambulatory Visit (HOSPITAL_COMMUNITY): Payer: Medicare HMO

## 2023-05-29 ENCOUNTER — Ambulatory Visit (HOSPITAL_COMMUNITY)
Admission: RE | Admit: 2023-05-29 | Discharge: 2023-05-29 | Disposition: A | Discharge status: Home / Self Care | Payer: Medicare HMO | Attending: Internal Medicine | Admitting: Internal Medicine

## 2023-05-29 ENCOUNTER — Encounter (HOSPITAL_COMMUNITY)
Admission: RE | Disposition: A | Discharge status: Home / Self Care | Payer: Medicare HMO | Source: Home / Self Care | Attending: Internal Medicine

## 2023-05-29 DIAGNOSIS — Z79899 Other long term (current) drug therapy: Secondary | ICD-10-CM | POA: Diagnosis not present

## 2023-05-29 DIAGNOSIS — I495 Sick sinus syndrome: Secondary | ICD-10-CM | POA: Diagnosis not present

## 2023-05-29 DIAGNOSIS — R569 Unspecified convulsions: Secondary | ICD-10-CM | POA: Diagnosis not present

## 2023-05-29 DIAGNOSIS — Z950 Presence of cardiac pacemaker: Secondary | ICD-10-CM | POA: Diagnosis not present

## 2023-05-29 HISTORY — PX: PACEMAKER INSERTION: SHX728

## 2023-05-29 HISTORY — PX: PACEMAKER IMPLANT: EP1218

## 2023-05-29 SURGERY — PACEMAKER IMPLANT

## 2023-05-29 MED ORDER — SODIUM CHLORIDE 0.9 % IV SOLN
INTRAVENOUS | Status: AC
  Filled 2023-05-29: qty 2

## 2023-05-29 MED ORDER — ONDANSETRON HCL 4 MG/2ML IJ SOLN: 4.0000 mg | Freq: Four times a day (QID) | INTRAMUSCULAR | Status: DC | PRN

## 2023-05-29 MED ORDER — CEFAZOLIN SODIUM-DEXTROSE 2-4 GM/100ML-% IV SOLN
2.0000 g | INTRAVENOUS | Status: AC
  Administered 2023-05-29: 2 g via INTRAVENOUS

## 2023-05-29 MED ORDER — ACETAMINOPHEN 325 MG PO TABS: 325.0000 mg | ORAL_TABLET | ORAL | Status: DC | PRN

## 2023-05-29 MED ORDER — CEFAZOLIN SODIUM-DEXTROSE 2-4 GM/100ML-% IV SOLN
INTRAVENOUS | Status: AC
  Filled 2023-05-29: qty 100

## 2023-05-29 MED ORDER — SODIUM CHLORIDE 0.9 % IV SOLN: INTRAVENOUS | Status: DC

## 2023-05-29 MED ORDER — LIDOCAINE HCL (PF) 1 % IJ SOLN
INTRAMUSCULAR | Status: DC | PRN
  Administered 2023-05-29: 60 mL

## 2023-05-29 MED ORDER — SODIUM CHLORIDE 0.9 % IV SOLN
80.0000 mg | INTRAVENOUS | Status: AC
  Administered 2023-05-29: 80 mg

## 2023-05-29 MED ORDER — HEPARIN (PORCINE) IN NACL 1000-0.9 UT/500ML-% IV SOLN
INTRAVENOUS | Status: DC | PRN
  Administered 2023-05-29: 500 mL

## 2023-05-29 MED ORDER — MIDAZOLAM HCL 5 MG/5ML IJ SOLN
INTRAMUSCULAR | Status: AC
  Filled 2023-05-29: qty 5

## 2023-05-29 MED ORDER — CEFAZOLIN SODIUM-DEXTROSE 1-4 GM/50ML-% IV SOLN
1.0000 g | Freq: Once | INTRAVENOUS | Status: AC
  Administered 2023-05-29: 1 g via INTRAVENOUS
  Filled 2023-05-29: qty 50

## 2023-05-29 MED ORDER — POVIDONE-IODINE 10 % EX SWAB
2.0000 | Freq: Once | CUTANEOUS | Status: AC
  Administered 2023-05-29: 2 via TOPICAL

## 2023-05-29 MED ORDER — CHLORHEXIDINE GLUCONATE 4 % EX SOLN
4.0000 | Freq: Once | CUTANEOUS | Status: AC
  Administered 2023-05-29: 4 via TOPICAL
  Filled 2023-05-29: qty 60

## 2023-05-29 MED ORDER — SODIUM CHLORIDE 0.9 % IV SOLN
INTRAVENOUS | Status: DC | PRN
  Administered 2023-05-29: 80 mg

## 2023-05-29 MED ORDER — LIDOCAINE HCL (PF) 1 % IJ SOLN
INTRAMUSCULAR | Status: AC
  Filled 2023-05-29: qty 60

## 2023-05-29 MED ORDER — FENTANYL CITRATE (PF) 100 MCG/2ML IJ SOLN
INTRAMUSCULAR | Status: DC | PRN
  Administered 2023-05-29: 12.5 ug via INTRAVENOUS

## 2023-05-29 MED ORDER — FENTANYL CITRATE (PF) 100 MCG/2ML IJ SOLN
25.0000 ug | INTRAMUSCULAR | Status: DC | PRN
  Administered 2023-05-29: 25 ug via INTRAVENOUS
  Filled 2023-05-29: qty 2

## 2023-05-29 MED ORDER — MIDAZOLAM HCL 5 MG/5ML IJ SOLN
INTRAMUSCULAR | Status: DC | PRN
  Administered 2023-05-29: 1 mg via INTRAVENOUS

## 2023-05-29 MED ORDER — FENTANYL CITRATE (PF) 100 MCG/2ML IJ SOLN
INTRAMUSCULAR | Status: AC
  Filled 2023-05-29: qty 2

## 2023-05-29 SURGICAL SUPPLY — 9 items
CABLE SURGICAL S-101-97-12 (CABLE) ×1 IMPLANT
IPG PACE AZUR XT DR MRI W1DR01 (Pacemaker) IMPLANT
LEAD CAPSURE NOVUS 45CM (Lead) IMPLANT
LEAD CAPSURE NOVUS 5076-52CM (Lead) IMPLANT
PACE AZURE XT DR MRI W1DR01 (Pacemaker) ×1 IMPLANT
PAD DEFIB RADIO PHYSIO CONN (PAD) ×1 IMPLANT
SHEATH 7FR PRELUDE SNAP 13 (SHEATH) IMPLANT
TRAY PACEMAKER INSERTION (PACKS) ×1 IMPLANT
WIRE HI TORQ VERSACORE-J 145CM (WIRE) IMPLANT

## 2023-05-29 NOTE — Progress Notes (Signed)
Report to Vivian, RN.

## 2023-05-29 NOTE — Progress Notes (Signed)
 Patient and wife was given discharge instructions. Both verbalized understanding.

## 2023-05-29 NOTE — Progress Notes (Signed)
 Dr. Carolynne Citron came and U/S patient and looked at CXR stated he was okay to be discharged home.

## 2023-05-29 NOTE — Progress Notes (Signed)
 On arrival from cath lab, c/o 5/10 substernal chest discomfort and states it is worse when takes a deep breath; call in to Dr Carolynne Citron

## 2023-05-29 NOTE — Discharge Instructions (Addendum)
 After Your Pacemaker   You have a Medtronic Pacemaker  ACTIVITY Do not lift your arm above shoulder height for 1 week after your procedure. After 7 days, you may progress as below.  You should remove your sling 24 hours after your procedure, unless otherwise instructed by your provider.     Wednesday June 05, 2023  Thursday June 06, 2023 Friday June 07, 2023 Saturday June 08, 2023   Do not lift, push, pull, or carry anything over 10 pounds with the affected arm until 6 weeks (Wednesday July 10, 2023 ) after your procedure.   You may drive AFTER your wound check, unless you have been told otherwise by your provider.   Ask your healthcare provider when you can go back to work   INCISION/Dressing   If large square, outer bandage is left in place, this can be removed after 24 hours from your procedure. Do not remove steri-strips or glue as below.   If a PRESSURE DRESSING (a bulky dressing that usually goes up over your shoulder) was applied or left in place, please follow instructions given by your provider on when to return to have this removed.   Monitor your Pacemaker site for redness, swelling, and drainage. Call the device clinic at [phone] if you experience these symptoms or fever/chills.  If your incision is sealed with Steri-strips or staples, you may shower 7 days after your procedure or when told by your provider. Do not remove the steri-strips or let the shower hit directly on your site. You may wash around your site with soap and water .    If you were discharged in a sling, please do not wear this during the day more than 48 hours after your surgery unless otherwise instructed. This may increase the risk of stiffness and soreness in your shoulder.   Avoid lotions, ointments, or perfumes over your incision until it is well-healed.  You may use a hot tub or a pool AFTER your wound check appointment if the incision is completely closed.  Pacemaker  Alerts:  Some alerts are vibratory and others beep. These are NOT emergencies. Please call our office to let us  know. If this occurs at night or on weekends, it can wait until the next business day. Send a remote transmission.  If your device is capable of reading fluid status (for heart failure), you will be offered monthly monitoring to review this with you.   DEVICE MANAGEMENT Remote monitoring is used to monitor your pacemaker from home. This monitoring is scheduled every 91 days by our office. It allows us  to keep an eye on the functioning of your device to ensure it is working properly. You will routinely see your Electrophysiologist annually (more often if necessary).   You should receive your ID card for your new device in 4-8 weeks. Keep this card with you at all times once received. Consider wearing a medical alert bracelet or necklace.  Your Pacemaker may be MRI compatible. This will be discussed at your next office visit/wound check.  You should avoid contact with strong electric or magnetic fields.   Do not use amateur (ham) radio equipment or electric (arc) welding torches. MP3 player headphones with magnets should not be used. Some devices are safe to use if held at least 12 inches (30 cm) from your Pacemaker. These include power tools, lawn mowers, and speakers. If you are unsure if something is safe to use, ask your health care provider.  When using your cell phone, hold  it to the ear that is on the opposite side from the Pacemaker. Do not leave your cell phone in a pocket over the Pacemaker.  You may safely use electric blankets, heating pads, computers, and microwave ovens.  Call the office right away if: You have chest pain. You feel more short of breath than you have felt before. You feel more light-headed than you have felt before. Your incision starts to open up.  This information is not intended to replace advice given to you by your health care provider. Make sure you  discuss any questions you have with your health care provider.

## 2023-05-29 NOTE — Interval H&P Note (Signed)
 History and Physical Interval Note:  05/29/2023 7:33 AM  Terry Guerrero  has presented today for surgery, with the diagnosis of snd.  The various methods of treatment have been discussed with the patient and family. After consideration of risks, benefits and other options for treatment, the patient has consented to  Procedure(s): PACEMAKER IMPLANT (N/A) as a surgical intervention.  The patient's history has been reviewed, patient examined, no change in status, stable for surgery.  I have reviewed the patient's chart and labs.  Questions were answered to the patient's satisfaction.     Danelle Birmingham

## 2023-05-29 NOTE — Progress Notes (Signed)
Dr Taylor in to see client 

## 2023-05-30 ENCOUNTER — Encounter (HOSPITAL_COMMUNITY): Payer: Self-pay

## 2023-05-30 ENCOUNTER — Telehealth (HOSPITAL_COMMUNITY): Payer: Self-pay

## 2023-05-30 NOTE — Telephone Encounter (Signed)
 Spoke with patient to complete post procedure follow up call.   You have a Medtronic Pacemaker   Patient reports no complications with incision site. He denies experiencing any further chest pressure since discharged home.   Instructions reviewed with patient:   Remove the large square bandage on the chest after 24 hours from procedure.   If your incision is sealed with Steri-strips or staples, you may shower 7 days after your procedure or when told by your provider. Do not remove the steri-strips or let the shower hit directly on your site. You may wash around your site with soap and water  It is common to have bruising AND soreness around incision site. Call the device clinic at [phone] if you notice any signs of infection: fever, redness, swelling, pus drainage, foul odor or increased pain at incision. Reviewed activity restrictions with patient. Patient inquired if there are any contraindications to using a microwave or a TENS unit as needed for his lower back. Advised patient that it OK to use the microwave safely with device and will confirm with provider if OK to use the TENS unit and send a MyChart with provider response. He voiced understanding.   You will follow up with the [facility] Device clinic on 06/06/2023 your procedure. Patient verbalized understanding to all information provided.

## 2023-05-30 NOTE — Telephone Encounter (Signed)
 Attempted to reach patient to follow up with procedure completed on 05/29/2023, no answer. Left VM for patient to return call.

## 2023-06-06 ENCOUNTER — Ambulatory Visit: Payer: Medicare HMO | Attending: Cardiology

## 2023-06-06 DIAGNOSIS — I495 Sick sinus syndrome: Secondary | ICD-10-CM | POA: Diagnosis not present

## 2023-06-06 LAB — CUP PACEART INCLINIC DEVICE CHECK
Brady Statistic RA Percent Paced: 0.8 %
Brady Statistic RV Percent Paced: 0.1 %
Date Time Interrogation Session: 20250213123006
Implantable Lead Connection Status: 753985
Implantable Lead Connection Status: 753985
Implantable Lead Implant Date: 20250205
Implantable Lead Implant Date: 20250205
Implantable Lead Location: 753859
Implantable Lead Location: 753860
Implantable Lead Model: 5076
Implantable Lead Model: 5076
Implantable Pulse Generator Implant Date: 20250205

## 2023-06-06 NOTE — Progress Notes (Signed)
Normal Pacemaker wound check. Wound well healed. Thresholds, sensing, and impedances consistent with implant measurements and at 3.5V safety margin/auto capture until 3 month visit. No episodes. Reviewed arm restrictions to continue for 6 weeks total post op.  Pt enrolled in remote follow-up.

## 2023-06-06 NOTE — Patient Instructions (Addendum)

## 2023-07-11 ENCOUNTER — Other Ambulatory Visit: Payer: Self-pay | Admitting: *Deleted

## 2023-07-11 DIAGNOSIS — Z79899 Other long term (current) drug therapy: Secondary | ICD-10-CM

## 2023-07-11 DIAGNOSIS — Z8719 Personal history of other diseases of the digestive system: Secondary | ICD-10-CM

## 2023-07-11 DIAGNOSIS — M0609 Rheumatoid arthritis without rheumatoid factor, multiple sites: Secondary | ICD-10-CM

## 2023-07-11 MED ORDER — HUMIRA (2 PEN) 40 MG/0.4ML ~~LOC~~ AJKT: 40.0000 mg | AUTO-INJECTOR | SUBCUTANEOUS | 0 refills | Status: DC

## 2023-07-11 NOTE — Telephone Encounter (Signed)
,  Last Fill: 02/18/2023  Labs: 05/21/2023 RBC 4.16, Sodium 134   TB Gold: 11/07/2022   TB gold negative   Next Visit: 09/23/2023  Last Visit: 04/25/2023  GN:[id number] arthritis of multiple sites with negative rheumatoid factor   Current Dose per office note 04/25/2023: Humira 40 mg sq injections every 14 days   Okay to refill Humira?

## 2023-07-12 ENCOUNTER — Ambulatory Visit: Payer: Medicare HMO

## 2023-07-12 DIAGNOSIS — I495 Sick sinus syndrome: Secondary | ICD-10-CM | POA: Diagnosis not present

## 2023-07-12 LAB — CUP PACEART REMOTE DEVICE CHECK
Battery Remaining Longevity: 183 mo
Battery Voltage: 3.22 V
Brady Statistic AP VP Percent: 0.01 %
Brady Statistic AP VS Percent: 4.12 %
Brady Statistic AS VP Percent: 0.06 %
Brady Statistic AS VS Percent: 95.81 %
Brady Statistic RA Percent Paced: 4.14 %
Brady Statistic RV Percent Paced: 0.07 %
Date Time Interrogation Session: 20250321011943
Implantable Lead Connection Status: 753985
Implantable Lead Connection Status: 753985
Implantable Lead Implant Date: 20250205
Implantable Lead Implant Date: 20250205
Implantable Lead Location: 753859
Implantable Lead Location: 753860
Implantable Lead Model: 5076
Implantable Lead Model: 5076
Implantable Pulse Generator Implant Date: 20250205
Lead Channel Impedance Value: 304 Ohm
Lead Channel Impedance Value: 456 Ohm
Lead Channel Impedance Value: 551 Ohm
Lead Channel Impedance Value: 551 Ohm
Lead Channel Pacing Threshold Amplitude: 0.75 V
Lead Channel Pacing Threshold Amplitude: 0.875 V
Lead Channel Pacing Threshold Pulse Width: 0.4 ms
Lead Channel Pacing Threshold Pulse Width: 0.4 ms
Lead Channel Sensing Intrinsic Amplitude: 22 mV
Lead Channel Sensing Intrinsic Amplitude: 22 mV
Lead Channel Sensing Intrinsic Amplitude: 3.125 mV
Lead Channel Sensing Intrinsic Amplitude: 3.125 mV
Lead Channel Setting Pacing Amplitude: 1.75 V
Lead Channel Setting Pacing Amplitude: 2 V
Lead Channel Setting Pacing Pulse Width: 0.4 ms
Lead Channel Setting Sensing Sensitivity: 1.2 mV
Zone Setting Status: 755011

## 2023-07-15 ENCOUNTER — Encounter: Payer: Self-pay | Admitting: Internal Medicine

## 2023-07-24 ENCOUNTER — Encounter: Payer: Self-pay | Admitting: *Deleted

## 2023-07-25 ENCOUNTER — Telehealth: Payer: Self-pay | Admitting: Pharmacist

## 2023-07-25 ENCOUNTER — Other Ambulatory Visit (HOSPITAL_COMMUNITY): Payer: Self-pay

## 2023-07-25 DIAGNOSIS — Z79899 Other long term (current) drug therapy: Secondary | ICD-10-CM

## 2023-07-25 DIAGNOSIS — Z8719 Personal history of other diseases of the digestive system: Secondary | ICD-10-CM

## 2023-07-25 DIAGNOSIS — M0609 Rheumatoid arthritis without rheumatoid factor, multiple sites: Secondary | ICD-10-CM

## 2023-07-25 MED ORDER — HUMIRA (2 PEN) 40 MG/0.4ML ~~LOC~~ AJKT
40.0000 mg | AUTO-INJECTOR | SUBCUTANEOUS | 1 refills | Status: DC
  Filled 2023-07-26: qty 2, 28d supply, fill #0
  Filled 2023-08-13 – 2023-08-14 (×2): qty 2, 28d supply, fill #1

## 2023-07-25 NOTE — Telephone Encounter (Signed)
 Patient contacted the office stating he has changed his insurance in January and can no longer fill his Humira through [place]. Patient states he still has [employer] just more coverage rather than an assistance program. Advised the patient I spoke with the pharmacist and they will run a new PA today and call with updates.   Patient states he would like to know how long it may take because it is going to mess his Humira schedule up. Patient states he was expecting his shot today in the mail. Please advise.

## 2023-07-25 NOTE — Telephone Encounter (Signed)
 Patient has had change in insurance to [facility]  Submitted a Prior Authorization request to [facility] for HUMIRA via CoverMyMeds. Will update once we receive a response.  Key: Catalina Antigua, PharmD, MPH, BCPS, CPP Clinical Pharmacist (Rheumatology and Pulmonology)

## 2023-07-25 NOTE — Telephone Encounter (Signed)
 Received notification from [facility] regarding a prior authorization for HUMIRA. Authorization has been APPROVED from 04/24/2023 to 04/22/2024. Approval letter sent to scan center.  Per test claim, copay for 28 days supply is $0  Patient can fill through [facility]: [phone]   Rx sent to [facility]. Patient advised that Mills Koller is OOO but will reach out to schedule shipment. Patient is due for Humira today, but we unfortunately do not have samples of Humira   Chesley Mires, PharmD, MPH, BCPS, CPP Clinical Pharmacist (Rheumatology and Pulmonology)

## 2023-07-26 ENCOUNTER — Telehealth: Payer: Self-pay | Admitting: Internal Medicine

## 2023-07-26 ENCOUNTER — Other Ambulatory Visit: Payer: Self-pay

## 2023-07-26 ENCOUNTER — Ambulatory Visit: Payer: Self-pay

## 2023-07-26 NOTE — Progress Notes (Signed)
 Patient no longer eligible for Humira PAP through [employer]. No copay through insurance. Patient will fill with [facility]. Missed dose that was due yesterday but otherwise doing well on treatment  Chesley Mires, PharmD, MPH, BCPS, CPP Clinical Pharmacist (Rheumatology and Pulmonology)

## 2023-07-26 NOTE — Telephone Encounter (Signed)
 The patient would like to know if he can use a TENS unit or a muscle relaxer device to help with his muscles. He just had his PPM implanted in February 2025.

## 2023-07-26 NOTE — Progress Notes (Signed)
 Specialty Pharmacy Initial Fill Coordination Note  Terry Guerrero is a 63 y.o. male contacted today regarding initial fill of specialty medication(s) Adalimumab (Humira (2 Pen))   Patient requested Delivery   Delivery date: 07/30/23   Verified address: [address]   [place] [address]   Medication will be filled on 07/29/23.   Patient is aware of $0 copayment.

## 2023-07-26 NOTE — Telephone Encounter (Signed)
 Patient called back within a few minutes of leaving message. Patient was instructed to call his cardiologist to get a definitive answer about using TENS unit. Patient states he has attempted to call him multiple times without any success. Information from [facility] was shared with patient including that a TENS unit has the potential to interfere with the pacemaker function. Patient verbalized understanding and states he is going to try his cardiologist again.   Reason for Disposition  General information question, no triage required and triager able to answer question  Answer Assessment - Initial Assessment Questions 1. REASON FOR CALL or QUESTION: "What is your reason for calling today?" or "How can I best help you?" or "What question do you have that I can help answer?"     Patient calling to see if he can use a TENS unit for pain after strenuous activity over the last couple of days. Patient states he has attempted to get a hold of his cardiologist but hasn't been able to get a definitive answer.  Protocols used: Information Only Call - No Triage-A-AH 1st attempt to call patient-left a voice mail to call back. Patient is asking if he would be able to use a TENS unit to help with soreness after doing strenuous activity in the past couple of days.   Per [facility]-patients with cardiac pacemakers should not be excluded from the use of TENS unit but have careful evaluation and extended cardiac monitoring. There is a potential for electromagnetic interference that could affect pacemaker function.   Patient placed into callbacks.  Copied From CRM [id number]. Reason for Triage:   Patient has had pacemaker placed in 05/2023. He had done some strenuous activity in the past couple of days and would like to know if he can use a TENS unit to help with soreness. He was not sure due to the pacemaker.   CB# [phone]  -if patient does not answer, asks that a detailed message be left with a call back number. He does  not typically answer unknown numbers.

## 2023-07-29 ENCOUNTER — Other Ambulatory Visit: Payer: Self-pay

## 2023-07-29 ENCOUNTER — Ambulatory Visit: Payer: Self-pay

## 2023-07-29 ENCOUNTER — Ambulatory Visit: Admitting: Family Medicine

## 2023-07-29 ENCOUNTER — Encounter: Payer: Self-pay | Admitting: Family Medicine

## 2023-07-29 ENCOUNTER — Other Ambulatory Visit: Payer: Self-pay | Admitting: Family Medicine

## 2023-07-29 VITALS — BP 130/72 | HR 71 | Temp 97.9°F | Ht 66.0 in | Wt 140.4 lb

## 2023-07-29 DIAGNOSIS — S39012A Strain of muscle, fascia and tendon of lower back, initial encounter: Secondary | ICD-10-CM

## 2023-07-29 MED ORDER — METHOCARBAMOL 750 MG PO TABS: 750.0000 mg | ORAL_TABLET | Freq: Four times a day (QID) | ORAL | 0 refills | Status: DC
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
## 2023-07-29 NOTE — Assessment & Plan Note (Signed)
 Continue NSAIDs PRN. Start Robaxin 750mg  4 times daily PRN for low back pain. Continue rest and heat application. Seek medical care for saddle numbness, lower extremity sensory changes or weakness, incontinence of urine or stool. Return to PCP if symptoms persist or worsen.

## 2023-07-30 ENCOUNTER — Other Ambulatory Visit: Payer: Self-pay | Admitting: Family Medicine

## 2023-07-30 MED ORDER — TIZANIDINE HCL 4 MG PO TABS: 4.0000 mg | ORAL_TABLET | Freq: Three times a day (TID) | ORAL | 0 refills | Status: DC | PRN

## 2023-08-06 ENCOUNTER — Encounter: Payer: Self-pay | Admitting: Neurology

## 2023-08-13 ENCOUNTER — Other Ambulatory Visit (HOSPITAL_COMMUNITY): Payer: Self-pay

## 2023-08-13 ENCOUNTER — Other Ambulatory Visit: Payer: Self-pay

## 2023-08-14 ENCOUNTER — Other Ambulatory Visit: Payer: Self-pay

## 2023-08-14 ENCOUNTER — Other Ambulatory Visit: Payer: Self-pay | Admitting: Pharmacy Technician

## 2023-08-14 NOTE — Progress Notes (Signed)
 Specialty Pharmacy Refill Coordination Note  Terry Guerrero is a 63 y.o. male contacted today regarding refills of specialty medication(s) Adalimumab  (Humira  (2 Pen))   Patient requested (Patient-Rptd) Delivery   Delivery date: 08/23/23 (Unable to fullfill delivery request. No shipping on Fridays.)   Verified address: (Patient-Rptd) [address] [place] [address]   Medication will be filled on 08/22/23.   Left voicemail for patient to fill med on 05/1 for delivery by 5/2. Unable to fullfill requested date due to no shipping on Fridays.

## 2023-08-15 ENCOUNTER — Other Ambulatory Visit: Payer: Self-pay | Admitting: Family Medicine

## 2023-08-16 ENCOUNTER — Other Ambulatory Visit (HOSPITAL_COMMUNITY): Payer: Self-pay

## 2023-08-16 MED ORDER — TIZANIDINE HCL 4 MG PO TABS
4.0000 mg | ORAL_TABLET | Freq: Three times a day (TID) | ORAL | 0 refills | Status: DC | PRN
  Filled 2023-08-16 – 2023-08-27 (×2): qty 30, 10d supply, fill #0

## 2023-08-20 NOTE — Progress Notes (Signed)
 Remote pacemaker transmission.

## 2023-08-22 ENCOUNTER — Other Ambulatory Visit: Payer: Self-pay

## 2023-08-22 DIAGNOSIS — G40909 Epilepsy, unspecified, not intractable, without status epilepticus: Secondary | ICD-10-CM | POA: Diagnosis not present

## 2023-08-22 DIAGNOSIS — I129 Hypertensive chronic kidney disease with stage 1 through stage 4 chronic kidney disease, or unspecified chronic kidney disease: Secondary | ICD-10-CM | POA: Diagnosis not present

## 2023-08-22 DIAGNOSIS — F325 Major depressive disorder, single episode, in full remission: Secondary | ICD-10-CM | POA: Diagnosis not present

## 2023-08-22 DIAGNOSIS — K5900 Constipation, unspecified: Secondary | ICD-10-CM | POA: Diagnosis not present

## 2023-08-22 DIAGNOSIS — M069 Rheumatoid arthritis, unspecified: Secondary | ICD-10-CM | POA: Diagnosis not present

## 2023-08-22 DIAGNOSIS — R269 Unspecified abnormalities of gait and mobility: Secondary | ICD-10-CM | POA: Diagnosis not present

## 2023-08-22 DIAGNOSIS — K5190 Ulcerative colitis, unspecified, without complications: Secondary | ICD-10-CM | POA: Diagnosis not present

## 2023-08-22 DIAGNOSIS — I4891 Unspecified atrial fibrillation: Secondary | ICD-10-CM | POA: Diagnosis not present

## 2023-08-22 DIAGNOSIS — I495 Sick sinus syndrome: Secondary | ICD-10-CM | POA: Diagnosis not present

## 2023-08-22 DIAGNOSIS — G709 Myoneural disorder, unspecified: Secondary | ICD-10-CM | POA: Diagnosis not present

## 2023-08-22 DIAGNOSIS — D84821 Immunodeficiency due to drugs: Secondary | ICD-10-CM | POA: Diagnosis not present

## 2023-08-22 DIAGNOSIS — K219 Gastro-esophageal reflux disease without esophagitis: Secondary | ICD-10-CM | POA: Diagnosis not present

## 2023-08-26 ENCOUNTER — Encounter: Payer: Self-pay | Admitting: Internal Medicine

## 2023-08-26 ENCOUNTER — Ambulatory Visit: Payer: Medicare HMO | Attending: Internal Medicine | Admitting: Internal Medicine

## 2023-08-26 VITALS — BP 138/92 | HR 71 | Ht 66.0 in | Wt 139.0 lb

## 2023-08-26 DIAGNOSIS — I495 Sick sinus syndrome: Secondary | ICD-10-CM | POA: Diagnosis not present

## 2023-08-26 LAB — CUP PACEART INCLINIC DEVICE CHECK
Brady Statistic RA Percent Paced: 3.7 %
Brady Statistic RV Percent Paced: 0.1 % — CL
Date Time Interrogation Session: 20250505144735
Implantable Lead Connection Status: 753985
Implantable Lead Connection Status: 753985
Implantable Lead Implant Date: 20250205
Implantable Lead Implant Date: 20250205
Implantable Lead Location: 753859
Implantable Lead Location: 753860
Implantable Lead Model: 5076
Implantable Lead Model: 5076
Implantable Pulse Generator Implant Date: 20250205

## 2023-08-26 NOTE — Patient Instructions (Signed)
 Medication Instructions:  Your physician recommends that you continue on your current medications as directed. Please refer to the Current Medication list given to you today.  *If you need a refill on your cardiac medications before your next appointment, please call your pharmacy*  Lab Work: None If you have labs (blood work) drawn today and your tests are completely normal, you will receive your results only by: MyChart Message (if you have MyChart) OR A paper copy in the mail If you have any lab test that is abnormal or we need to change your treatment, we will call you to review the results.  Testing/Procedures: None  Follow-Up: At [facility], you and your health needs are our priority.  As part of our continuing mission to provide you with exceptional heart care, our providers are all part of one team.  This team includes your primary Cardiologist (physician) and Advanced Practice Providers or APPs (Physician Assistants and Nurse Practitioners) who all work together to provide you with the care you need, when you need it.  Your next appointment:   1 year(s)  Provider:   Manya Sells, MD  We recommend signing up for the patient portal called "MyChart".  Sign up information is provided on this After Visit Summary.  MyChart is used to connect with patients for Virtual Visits (Telemedicine).  Patients are able to view lab/test results, encounter notes, upcoming appointments, etc.  Non-urgent messages can be sent to your provider as well.   To learn more about what you can do with MyChart, go to [email or web address].   Other Instructions

## 2023-08-26 NOTE — Progress Notes (Signed)
 HPI Mr. Terry Guerrero returns today for followup of syncope. He is a pleasant 63 yo man with sinus node dysfunction who has had daytime pauses of almost 5 seconds. He initially refused PPM insertion. Since his last visit over a year ago he notes spells which his SO has filmed which shows what appears to be partial seizure activity. But he also has episodes where he suddenly goes unconscious for a few seconds. He feels immediately well after her awakens. He underwent insertion of a DDD PPM insertion about 3 months ago.  Allergies  Allergen Reactions   Gold-Containing Drug Products Shortness Of Breath and Rash   Lamisil [Terbinafine] Other (See Comments)    The generic made his skin flaky and red itchy rash   Naprosyn [Naproxen]     Unknown reaction    Oxycodone  Nausea And Vomiting    Pt states that he cannot take oxycodone  because it causes upset stomach. He is able to tolerate hydrocodone  with no problem     Current Outpatient Medications  Medication Sig Dispense Refill   adalimumab  (HUMIRA , 2 PEN,) 40 MG/0.4ML pen Inject 0.4 mLs (40 mg total) into the skin every 14 (fourteen) days. 2 each 1   diazePAM , 15 MG Dose, (VALTOCO  15 MG DOSE) 2 x 7.5 MG/0.1ML LQPK Administer 1 spray in 1 nostril, second spray in other nostril (one dose) for seizure longer than 5 minutes. May use second dose after 4 hours if needed 5 each 5   oxcarbazepine  (TRILEPTAL ) 600 MG tablet Take 2 tablets (1200mg ) twice a day (Patient taking differently: Take 900 mg by mouth 2 (two) times daily.) 360 tablet 3   tiZANidine  (ZANAFLEX ) 4 MG tablet Take 1 tablet (4 mg total) by mouth every 8 (eight) hours as needed for muscle spasms. 30 tablet 0   No current facility-administered medications for this visit.     Past Medical History:  Diagnosis Date   Arthritis    Rheumatoid arthritis(714.0)    Seizures (HCC) 12/2020   Ulcerative colitis (HCC)     ROS:   All systems reviewed and negative except as noted in the  HPI.   Past Surgical History:  Procedure Laterality Date   ANTERIOR CRUCIATE LIGAMENT REPAIR     Left knee   COLONOSCOPY     HERNIA REPAIR  2020   umbilical   MOUTH SURGERY     Dentures   PACEMAKER IMPLANT N/A 05/29/2023   Procedure: PACEMAKER IMPLANT;  Surgeon: Tammie Fall, MD;  Location: [facility];  Service: Cardiovascular;  Laterality: N/A;   TOTAL KNEE ARTHROPLASTY Left 05/02/2022   Procedure: TOTAL KNEE ARTHROPLASTY;  Surgeon: Murleen Arms, MD;  Location: [facility];  Service: Orthopedics;  Laterality: Left;     Family History  Problem Relation Age of Onset   Alcoholism Mother    Colon cancer Neg Hx    Esophageal cancer Neg Hx    Stomach cancer Neg Hx    Rectal cancer Neg Hx      Social History   Socioeconomic History   Marital status: Divorced    Spouse name: Not on file   Number of children: 1   Years of education: Not on file   Highest education level: 12th grade  Occupational History   Occupation: Disabled   Tobacco Use   Smoking status: Every Day    Current packs/day: 0.75    Average packs/day: 0.8 packs/day for 25.8 years (19.3 ttl pk-yrs)    Types: Cigarettes  Start date: 05/20/2022    Passive exposure: Never   Smokeless tobacco: Former  Building services engineer status: Never Used  Substance and Sexual Activity   Alcohol  use: Yes    Comment: occ   Drug use: Yes    Types: Marijuana    Comment: marijuana daily   Sexual activity: Yes    Birth control/protection: None  Other Topics Concern   Not on file  Social History Narrative   Daily caffeine    Right handed    Lives with girlfriend. One story home   1 son.    Social Drivers of Corporate investment banker Strain: Low Risk  (07/29/2023)   Overall Financial Resource Strain (CARDIA)    Difficulty of Paying Living Expenses: Not very hard  Recent Concern: Financial Resource Strain - Medium Risk (05/23/2023)   Overall Financial Resource Strain (CARDIA)    Difficulty of Paying Living  Expenses: Somewhat hard  Food Insecurity: No Food Insecurity (07/29/2023)   Hunger Vital Sign    Worried About Running Out of Food in the Last Year: Never true    Ran Out of Food in the Last Year: Never true  Recent Concern: Food Insecurity - Food Insecurity Present (05/23/2023)   Hunger Vital Sign    Worried About Running Out of Food in the Last Year: Sometimes true    Ran Out of Food in the Last Year: Sometimes true  Transportation Needs: Unmet Transportation Needs (07/29/2023)   PRAPARE - Administrator, Civil Service (Medical): No    Lack of Transportation (Non-Medical): Yes  Physical Activity: Sufficiently Active (07/29/2023)   Exercise Vital Sign    Days of Exercise per Week: 3 days    Minutes of Exercise per Session: 60 min  Stress: No Stress Concern Present (07/29/2023)   [employer] of Occupational Health - Occupational Stress Questionnaire    Feeling of Stress : Not at all  Social Connections: Moderately Isolated (07/29/2023)   Social Connection and Isolation Panel [NHANES]    Frequency of Communication with Friends and Family: More than three times a week    Frequency of Social Gatherings with Friends and Family: Twice a week    Attends Religious Services: Never    Database administrator or Organizations: No    Attends Banker Meetings: Never    Marital Status: Living with partner  Intimate Partner Violence: Not At Risk (05/23/2023)   Humiliation, Afraid, Rape, and Kick questionnaire    Fear of Current or Ex-Partner: No    Emotionally Abused: No    Physically Abused: No    Sexually Abused: No     BP (!) 138/92   Pulse 71   Ht 5\' 6"  (1.676 m)   Wt 139 lb (63 kg)   SpO2 99%   BMI 22.44 kg/m   Physical Exam:  Well appearing NAD HEENT: Unremarkable Neck:  No JVD, no thyromegally Lymphatics:  No adenopathy Back:  No CVA tenderness Lungs:  Clear HEART:  Regular rate rhythm, no murmurs, no rubs, no clicks Abd:  soft, positive bowel sounds,  no organomegally, no rebound, no guarding Ext:  2 plus pulses, no edema, no cyanosis, no clubbing Skin:  No rashes no nodules Neuro:  CN II through XII intact, motor grossly intact  DEVICE  Normal device function.  See PaceArt for details.   Assess/Plan: Sinus node dysfunction - he is stable s/p PPM insertion for  5 second daytime pauses. PPM -his Medtronic DDD  PM is working normally. Tobacco abuse - he is still smoking a ppd. I encouraged him to stop.  Pete Brand Hanish Laraia,MD

## 2023-08-27 ENCOUNTER — Other Ambulatory Visit (HOSPITAL_COMMUNITY): Payer: Self-pay

## 2023-08-30 ENCOUNTER — Telehealth: Payer: Self-pay | Admitting: Neurology

## 2023-08-30 ENCOUNTER — Other Ambulatory Visit: Payer: Self-pay

## 2023-08-30 MED ORDER — VALTOCO 15 MG DOSE 2 X 7.5 MG/0.1ML NA LQPK: NASAL | 5 refills | Status: DC

## 2023-08-30 NOTE — Telephone Encounter (Signed)
 1. Which medications need refilled? (List name and dosage, if known) [facility]   2. Which pharmacy/location is medication to be sent to? (include street and city if local pharmacy) [facility]  He had a situation yesterday. He had to pick up his medicine in [place], because that was the only place that had any and they only had 1 box. Now [facility] [address] [facility], but they told him that it's against the law for them to transfer it back to them now and would need a new prescription sent in.

## 2023-08-30 NOTE — Telephone Encounter (Signed)
 Rx sent, thanks

## 2023-09-09 ENCOUNTER — Ambulatory Visit: Payer: Medicare HMO | Admitting: Neurology

## 2023-09-09 ENCOUNTER — Encounter: Payer: Self-pay | Admitting: Neurology

## 2023-09-09 VITALS — BP 99/65 | HR 77 | Ht 65.0 in | Wt 137.8 lb

## 2023-09-09 DIAGNOSIS — G40009 Localization-related (focal) (partial) idiopathic epilepsy and epileptic syndromes with seizures of localized onset, not intractable, without status epilepticus: Secondary | ICD-10-CM

## 2023-09-09 DIAGNOSIS — R0681 Apnea, not elsewhere classified: Secondary | ICD-10-CM | POA: Diagnosis not present

## 2023-09-09 MED ORDER — VALTOCO 15 MG DOSE 2 X 7.5 MG/0.1ML NA LQPK: NASAL | 5 refills | Status: DC

## 2023-09-09 MED ORDER — OXCARBAZEPINE 600 MG PO TABS: ORAL_TABLET | ORAL | 3 refills | Status: DC

## 2023-09-09 MED ORDER — ZONISAMIDE 100 MG PO CAPS: ORAL_CAPSULE | ORAL | 6 refills | Status: DC
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
## 2023-09-09 NOTE — Patient Instructions (Addendum)
 Good to see you.  Start Zonisamide  100mg : Take 1 capsule every night for 2 weeks, then increase to 2 capsules every night for 2 weeks, then increase to 3 capsules every night and continue  2. Continue Oxcarbazepine  600mg : take 1 and 1/2 tablets twice a day for now. Please update me in 6 weeks (once you are taking 300mg  of Zonisamide ), if no issues, we will plan to reduce Oxcarbazepine   3. Schedule 1-hour EEG  4. Schedule home sleep study  5. Refills sent for Valt[facility] .  6. Continue seizure calendar, follow-up in 3 months, call for any changes   Seizure Precautions: 1. If medication has been prescribed for you to prevent seizures, take it exactly as directed.  Do not stop taking the medicine without talking to your doctor first, even if you have not had a seizure in a long time.   2. Avoid activities in which a seizure would cause danger to yourself or to others.  Don't operate dangerous machinery, swim alone, or climb in high or dangerous places, such as on ladders, roofs, or girders.  Do not drive unless your doctor says you may.  3. If you have any warning that you may have a seizure, lay down in a safe place where you can't hurt yourself.    4.  No driving for 6 months from last seizure, as per [place]  state law.   Please refer to the following link on the [employer]ile[employer] of [employer]'s website for more information: http://www.epilepsyfoundation.org/answerplace/Social/driving/drivingu.cfm   5.  Maintain good sleep hygiene. Avoid alcohol .  6.  Contact your doctor if you have any problems that may be related to the medicine you are taking.  7.  Call 911 and bring the patient back to the ED if:        A.  The seizure lasts longer than 5 minutes.       B.  The patient doesn't awaken shortly after the seizure  C.  The patient has new problems such as difficulty seeing, speaking or moving  D.  The patient was injured during the seizure  E.  The patient has a temperature over 102  F (39C)  F.  The patient vomited and now is having trouble breathing

## 2023-09-09 NOTE — Progress Notes (Signed)
 Office Visit Note  Patient: Terry Guerrero             Date of Birth: [birth year]           MRN: [id number]             PCP: Austine Lefort, MD Referring: Austine Lefort, MD Visit Date: 09/23/2023 Occupation: @GUAROCC @  Subjective:  Medication monitoring   History of Present Illness: Terry Guerrero is a 63 y.o. male with history of seronegative rheumatoid arthritis and ulcerative colitis.  Patient remains on  Humira  40 mg sq injections every 14 days.  Terry Guerrero is tolerating Humira  without any side effects.  Terry Guerrero recently had a small gap in therapy due to his shipment being delayed.  Terry Guerrero has not had any recent or recurrent infections.  Terry Guerrero continues to find Humira  to be effective at managing his symptoms.  Terry Guerrero denies any signs or symptoms of an ulcerative colitis flare or rheumatoid arthritis flare.  Terry Guerrero denies any joint swelling at this time. Patient states that Terry Guerrero got a pacemaker in February 2025 and has not had any complications.  His energy level remains stable.    Activities of Daily Living:  Patient reports morning stiffness for 20-30 minutes.   Patient Denies nocturnal pain.  Difficulty dressing/grooming: Denies Difficulty climbing stairs: Denies Difficulty getting out of chair: Denies Difficulty using hands for taps, buttons, cutlery, and/or writing: Reports  Review of Systems  Constitutional:  Negative for fatigue.  HENT:  Negative for mouth sores and mouth dryness.   Eyes:  Negative for dryness.  Respiratory:  Negative for shortness of breath.   Cardiovascular:  Negative for chest pain and palpitations.  Gastrointestinal:  Negative for blood in stool, constipation and diarrhea.  Endocrine: Negative for increased urination.  Genitourinary:  Negative for involuntary urination.  Musculoskeletal:  Positive for morning stiffness. Negative for joint pain, gait problem, joint pain, joint swelling, myalgias, muscle weakness, muscle tenderness and myalgias.  Skin:  Negative for rash,  hair loss and sensitivity to sunlight.  Allergic/Immunologic: Negative for susceptible to infections.  Neurological:  Negative for dizziness and headaches.  Hematological:  Negative for swollen glands.  Psychiatric/Behavioral:  Negative for depressed mood and sleep disturbance. The patient is not nervous/anxious.     PMFS History:  Patient Active Problem List   Diagnosis Date Noted   Strain of lumbar region 07/29/2023   Spondylosis of lumbar spine 04/25/2023   Otomycosis of right ear 12/11/2022   Lumbar compression fracture, closed, initial encounter (HCC) 08/20/2022   Ulcerative pancolitis without complication (HCC) 02/14/2022   Syncope and collapse 04/12/2021   Degenerative scoliosis 03/15/2021   Rectal bleeding 05/07/2019   Strain of left pectoralis muscle 02/13/2017   Rheumatoid arthritis (HCC)     Past Medical History:  Diagnosis Date   Arthritis    Rheumatoid arthritis(714.0)    Seizures (HCC) 12/2020   Ulcerative colitis (HCC)     Family History  Problem Relation Age of Onset   Alcoholism Mother    Colon cancer Neg Hx    Esophageal cancer Neg Hx    Stomach cancer Neg Hx    Rectal cancer Neg Hx    Past Surgical History:  Procedure Laterality Date   ANTERIOR CRUCIATE LIGAMENT REPAIR     Left knee   COLONOSCOPY     HERNIA REPAIR  2020   umbilical   MOUTH SURGERY     Dentures   PACEMAKER IMPLANT N/A 05/29/2023   Procedure: PACEMAKER IMPLANT;  Surgeon: Tammie Fall, MD;  Location: [facility] INVASIVE CV LAB;  Service: Cardiovascular;  Laterality: N/A;   PACEMAKER INSERTION  05/29/2023   TOTAL KNEE ARTHROPLASTY Left 05/02/2022   Procedure: TOTAL KNEE ARTHROPLASTY;  Surgeon: Murleen Arms, MD;  Location: [facility];  Service: Orthopedics;  Laterality: Left;   Social History   Social History Narrative   Daily caffeine    Right handed    Lives with girlfriend. One story home   1 son.    Immunization History  Administered Date(s) Administered   Tdap 08/19/2022      Objective: Vital Signs: BP 125/76 (BP Location: Left Arm, Patient Position: Sitting, Cuff Size: Normal)   Pulse 70   Resp 17   Ht 5\' 6"  (1.676 m)   Wt 135 lb (61.2 kg)   BMI 21.79 kg/m    Physical Exam Vitals and nursing note reviewed.  Constitutional:      Appearance: Terry Guerrero is well-developed.  HENT:     Head: Normocephalic and atraumatic.  Eyes:     Conjunctiva/sclera: Conjunctivae normal.     Pupils: Pupils are equal, round, and reactive to light.  Cardiovascular:     Rate and Rhythm: Normal rate and regular rhythm.     Heart sounds: Normal heart sounds.     Comments: Pacemaker  Pulmonary:     Effort: Pulmonary effort is normal.     Breath sounds: Normal breath sounds.  Abdominal:     General: Bowel sounds are normal.     Palpations: Abdomen is soft.  Musculoskeletal:     Cervical back: Normal range of motion and neck supple.  Skin:    General: Skin is warm and dry.     Capillary Refill: Capillary refill takes less than 2 seconds.  Neurological:     Mental Status: Terry Guerrero is alert and oriented to person, place, and time.  Psychiatric:        Behavior: Behavior normal.      Musculoskeletal Exam: C-spine has good range of motion.  Slightly limited mobility with lumbar range of motion.  Shoulder joints have good range of motion.  Limited extension and flexion of both wrist joints.  Synovial thickening of both wrist joints, right more severe than left.  Synovial thickening of all MCP joints but no synovitis noted.  PIP and DIP thickening noted.  Subluxation of several DIP joints.  Incomplete fist formation noted bilaterally.  Hip joints have good range of motion with no groin pain.  Left knee replacement has good range of motion with mild warmth but no effusion.  Right knee joint has good range of motion with no warmth or effusion.  Ankle joints have good range of motion with no tenderness or joint swelling.  CDAI Exam: CDAI Score: -- Patient Global: --; Provider Global:  -- Swollen: --; Tender: -- Joint Exam 09/23/2023   No joint exam has been documented for this visit   There is currently no information documented on the homunculus. Go to the Rheumatology activity and complete the homunculus joint exam.  Investigation: No additional findings.  Imaging: CUP PACEART INCLINIC DEVICE CHECK Result Date: 08/26/2023 Normal in-clinic dual chamber pacemaker check. Presenting Rhythm: AS/VS. Routine testing of thresholds, sensing, and impedance demonstrate stable parameters and no programming changes needed at this time. 2 ST events logged, <1 minute in duration. Estimated longevity 15.2 years. Pt enrolled in remote follow-up.Gemma Kelp, BSN, RN   Recent Labs: Lab Results  Component Value Date   WBC 9.6 05/21/2023  HGB 13.4 05/21/2023   PLT 323 05/21/2023   NA 134 (L) 05/21/2023   K 4.1 05/21/2023   CL 103 05/21/2023   CO2 22 05/21/2023   GLUCOSE 91 05/21/2023   BUN 10 05/21/2023   CREATININE 0.71 05/21/2023   BILITOT 0.4 04/25/2023   ALKPHOS 67 08/19/2022   AST 16 04/25/2023   ALT 8 (L) 04/25/2023   PROT 6.6 04/25/2023   ALBUMIN 3.8 08/19/2022   CALCIUM 9.0 05/21/2023   GFRAA 94 05/04/2019   QFTBGOLDPLUS NEGATIVE 11/07/2022    Speciality Comments: Enbrel by Dr. Levitin (discontinued due to loss of insurance), Humira  started 11/01/21  Procedures:  No procedures performed Allergies: Gold-containing drug products, Lamisil [terbinafine], Naprosyn [naproxen], and Oxycodone    Assessment / Plan:     Visit Diagnoses: Rheumatoid arthritis of multiple sites with negative rheumatoid factor (HCC) - RF negative, anti-CCP negative, elevated sedimentation rate, severe erosive RA: dxd in his 84s. Ttd at [facility] later by Dr. Levitin with Enbrel: Terry Guerrero has severe end-stage rheumatoid arthritis and osteoarthritis overlap.  Terry Guerrero has no joint tenderness or synovitis on examination today.  Terry Guerrero has not had any signs or symptoms of a rheumatoid arthritis flare.  Terry Guerrero has  clinically been doing well on Humira  40 mg subcutaneous injections every 14 days.  Terry Guerrero is tolerating Humira  without any side effects.  No recent or recurrent infections.  Terry Guerrero continues to find Humira  to be effective at managing his symptoms.  Terry Guerrero was advised to notify us  if Terry Guerrero develops any new or worsening symptoms.  No medication changes will be made at this time.  Terry Guerrero will follow-up in the office in 5 months or sooner if needed.  High risk medication use - Humira  40 mg sq injections every 14 days.  Terry Guerrero discontinued sulfasalazine .  BMP and CBC updated on 05/21/23. Orders for CBC and CMP released today.  His next lab work will be due in September and every 3 months to monitor for drug toxicity. TB gold negative on 11/07/22.  Future order for TB gold placed today. No recent or recurrent infections.  Discussed the importance of holding humira  if Terry Guerrero develops signs or symptoms of an infection and to resume once the infection has completely cleared.   - Plan: CBC with Differential/Platelet, Comprehensive metabolic panel with GFR, QuantiFERON-TB Gold Plus  Screening for tuberculosis -Future order for TB gold placed today.  plan: QuantiFERON-TB Gold Plus  History of ulcerative colitis - Terry Guerrero has been followed by Dr. Sandrea Cruel.  No signs or symptoms of a flare.  Clinically doing well on Humira  40 mg sq injections every 14 days.   Pain in both hands - Clinical and radiographic findings were consistent with osteoarthritis and rheumatoid arthritis.  Synovial thickening of all MCP joints but no synovitis or tenderness noted.  PIP and DIP thickening consistent with osteoarthritis of both hands.  Incomplete fist formation noted.  Synovial thickening of both wrist joints with limited flexion and extension noted.   Status post total left knee replacement - January 2024--doing well.  Mild warmth but no effusion noted.  Pain in both feet: Previous x-rays were consistent with osteoarthritis and rheumatoid arthritis overlap.  Terry Guerrero is  not experiencing any increased discomfort in his feet at this time.  No tenderness or synovitis over ankle joints.  Spondylosis of lumbar spine: Slightly limited mobility of the lumbar spine with range of motion.  No symptoms of radiculopathy at this time.  Other medical conditions are listed as follows:   Closed stable burst  fracture of first lumbar vertebra with routine healing, subsequent encounter  Pacemaker   Temporal lobe epilepsy [facility]) - New onset seizures started October 2022.  Terry Guerrero is on Keppra .  Followed by Dr. Ty Gales.  Smoker  Orders: Orders Placed This Encounter  Procedures   CBC with Differential/Platelet   Comprehensive metabolic panel with GFR   QuantiFERON-TB Gold Plus   No orders of the defined types were placed in this encounter.   Follow-Up Instructions: Return in about 5 months (around 02/23/2024).   Romayne Clubs, PA-C  Note - This record has been created using Dragon software.  Chart creation errors have been sought, but may not always  have been located. Such creation errors do not reflect on  the standard of medical care.

## 2023-09-10 ENCOUNTER — Other Ambulatory Visit: Payer: Self-pay | Admitting: Rheumatology

## 2023-09-10 ENCOUNTER — Other Ambulatory Visit: Payer: Self-pay

## 2023-09-10 ENCOUNTER — Other Ambulatory Visit: Payer: Self-pay | Admitting: Neurology

## 2023-09-10 ENCOUNTER — Other Ambulatory Visit: Payer: Self-pay | Admitting: Pharmacy Technician

## 2023-09-10 DIAGNOSIS — Z8719 Personal history of other diseases of the digestive system: Secondary | ICD-10-CM

## 2023-09-10 DIAGNOSIS — M0609 Rheumatoid arthritis without rheumatoid factor, multiple sites: Secondary | ICD-10-CM

## 2023-09-10 DIAGNOSIS — Z79899 Other long term (current) drug therapy: Secondary | ICD-10-CM

## 2023-09-10 NOTE — Progress Notes (Signed)
 Specialty Pharmacy Refill Coordination Note  Terry Guerrero is a 63 y.o. male contacted today regarding refills of specialty medication(s) Adalimumab  (Humira  (2 Pen))   Patient requested Delivery   Delivery date: 09/18/23   Verified address: [address] [place] [address]   Medication will be filled on 09/17/23.  This fill date is pending response to refill request from provider. Sent Patient MyChart if they have not received fill by intended date they must follow up with pharmacy.

## 2023-09-10 NOTE — Telephone Encounter (Signed)
 Last Fill: 07/25/2023  Labs: 05/21/2023 Erythrocytes 4.16 BMP - Sodium 134 ([facility] tab)  TB Gold: 10/28/2022 TB Gold Negative   Next Visit: 09/23/2023  Last Visit: 04/25/2023  [id number] arthritis of multiple sites with negative rheumatoid factor   Current Dose per office note 04/25/2023: Humira  40 mg sq injections every 14 days   Okay to refill Humira ?

## 2023-09-11 ENCOUNTER — Other Ambulatory Visit: Payer: Self-pay

## 2023-09-11 MED ORDER — HUMIRA (2 PEN) 40 MG/0.4ML ~~LOC~~ AJKT
40.0000 mg | AUTO-INJECTOR | SUBCUTANEOUS | 0 refills | Status: DC
  Filled 2023-09-11: qty 2, 28d supply, fill #0

## 2023-09-12 ENCOUNTER — Telehealth: Payer: Self-pay | Admitting: Neurology

## 2023-09-12 ENCOUNTER — Other Ambulatory Visit: Payer: Self-pay | Admitting: Neurology

## 2023-09-12 MED ORDER — VALTOCO 15 MG DOSE 2 X 7.5 MG/0.1ML NA LQPK: NASAL | 5 refills | Status: DC

## 2023-09-12 NOTE — Telephone Encounter (Signed)
 Left message with the after hour service on 09-11-23 at 5:42 pm  Caller states that he is needing a new prescription sent in for his Valtoco  because his insurance stopped covering the 2 dose box caller needs a new RX for the 5 dose box   Caller states that his medication recently changed and he is needing a new RX sent to a different pharmacy states he is asymptomatic  wants to know what he should do

## 2023-09-12 NOTE — Telephone Encounter (Signed)
 Pt is calling because Rx is out of stock at pharmacy and would like a call back ASAP

## 2023-09-12 NOTE — Telephone Encounter (Signed)
 Pt called informed that I called the pharmacy on [address] they have one box of the valtoco  they are going to do a partial fill for him he can pick it up in a hour and he can get the rest of his RX when he returns from vacation ,

## 2023-09-17 ENCOUNTER — Other Ambulatory Visit: Payer: Self-pay

## 2023-09-19 ENCOUNTER — Other Ambulatory Visit: Payer: Self-pay | Admitting: Family Medicine

## 2023-09-20 ENCOUNTER — Other Ambulatory Visit (HOSPITAL_COMMUNITY): Payer: Self-pay

## 2023-09-20 ENCOUNTER — Telehealth: Payer: Self-pay | Admitting: Neurology

## 2023-09-20 MED ORDER — TIZANIDINE HCL 4 MG PO TABS
4.0000 mg | ORAL_TABLET | Freq: Three times a day (TID) | ORAL | 0 refills | Status: DC | PRN
  Filled 2023-09-20: qty 30, 10d supply, fill #0

## 2023-09-20 NOTE — Telephone Encounter (Signed)
 Pt needs to speak to someone about him having trouble getting the medication  Valtoco . The Pharmacy can only get 1 box every 15 days. Please call

## 2023-09-22 ENCOUNTER — Encounter: Payer: Self-pay | Admitting: Neurology

## 2023-09-23 ENCOUNTER — Encounter: Payer: Self-pay | Admitting: Physician Assistant

## 2023-09-23 ENCOUNTER — Ambulatory Visit: Payer: Medicare HMO | Attending: Physician Assistant | Admitting: Physician Assistant

## 2023-09-23 VITALS — BP 125/76 | HR 70 | Resp 17 | Ht 66.0 in | Wt 135.0 lb

## 2023-09-23 DIAGNOSIS — Z79899 Other long term (current) drug therapy: Secondary | ICD-10-CM | POA: Diagnosis not present

## 2023-09-23 DIAGNOSIS — R55 Syncope and collapse: Secondary | ICD-10-CM

## 2023-09-23 DIAGNOSIS — G40109 Localization-related (focal) (partial) symptomatic epilepsy and epileptic syndromes with simple partial seizures, not intractable, without status epilepticus: Secondary | ICD-10-CM | POA: Diagnosis not present

## 2023-09-23 DIAGNOSIS — M79641 Pain in right hand: Secondary | ICD-10-CM | POA: Diagnosis not present

## 2023-09-23 DIAGNOSIS — M79642 Pain in left hand: Secondary | ICD-10-CM

## 2023-09-23 DIAGNOSIS — F17200 Nicotine dependence, unspecified, uncomplicated: Secondary | ICD-10-CM | POA: Diagnosis not present

## 2023-09-23 DIAGNOSIS — M79672 Pain in left foot: Secondary | ICD-10-CM

## 2023-09-23 DIAGNOSIS — M47816 Spondylosis without myelopathy or radiculopathy, lumbar region: Secondary | ICD-10-CM | POA: Diagnosis not present

## 2023-09-23 DIAGNOSIS — Z111 Encounter for screening for respiratory tuberculosis: Secondary | ICD-10-CM | POA: Diagnosis not present

## 2023-09-23 DIAGNOSIS — S32011D Stable burst fracture of first lumbar vertebra, subsequent encounter for fracture with routine healing: Secondary | ICD-10-CM | POA: Diagnosis not present

## 2023-09-23 DIAGNOSIS — Z96652 Presence of left artificial knee joint: Secondary | ICD-10-CM | POA: Diagnosis not present

## 2023-09-23 DIAGNOSIS — M79671 Pain in right foot: Secondary | ICD-10-CM

## 2023-09-23 DIAGNOSIS — M0609 Rheumatoid arthritis without rheumatoid factor, multiple sites: Secondary | ICD-10-CM | POA: Diagnosis not present

## 2023-09-23 DIAGNOSIS — Z8719 Personal history of other diseases of the digestive system: Secondary | ICD-10-CM

## 2023-09-23 DIAGNOSIS — Z950 Presence of cardiac pacemaker: Secondary | ICD-10-CM

## 2023-09-23 NOTE — Telephone Encounter (Signed)
 Yes pls let Juluis Ok know so she can contact pharmacy

## 2023-09-24 ENCOUNTER — Ambulatory Visit: Payer: Self-pay | Admitting: Physician Assistant

## 2023-09-24 LAB — CBC WITH DIFFERENTIAL/PLATELET
Absolute Lymphocytes: 2986 {cells}/uL (ref 850–3900)
Absolute Monocytes: 822 {cells}/uL (ref 200–950)
Basophils Absolute: 79 {cells}/uL (ref 0–200)
Basophils Relative: 1 %
Eosinophils Absolute: 356 {cells}/uL (ref 15–500)
Eosinophils Relative: 4.5 %
HCT: 40.2 % (ref 38.5–50.0)
Hemoglobin: 13.4 g/dL (ref 13.2–17.1)
MCH: 31.8 pg (ref 27.0–33.0)
MCHC: 33.3 g/dL (ref 32.0–36.0)
MCV: 95.3 fL (ref 80.0–100.0)
MPV: 9.3 fL (ref 7.5–12.5)
Monocytes Relative: 10.4 %
Neutro Abs: 3658 {cells}/uL (ref 1500–7800)
Neutrophils Relative %: 46.3 %
Platelets: 339 10*3/uL (ref 140–400)
RBC: 4.22 10*6/uL (ref 4.20–5.80)
RDW: 12.8 % (ref 11.0–15.0)
Total Lymphocyte: 37.8 %
WBC: 7.9 10*3/uL (ref 3.8–10.8)

## 2023-09-24 LAB — COMPREHENSIVE METABOLIC PANEL WITH GFR
AG Ratio: 1.8 (calc) (ref 1.0–2.5)
ALT: 9 U/L (ref 9–46)
AST: 12 U/L (ref 10–35)
Albumin: 4.4 g/dL (ref 3.6–5.1)
Alkaline phosphatase (APISO): 86 U/L (ref 35–144)
BUN: 8 mg/dL (ref 7–25)
CO2: 25 mmol/L (ref 20–32)
Calcium: 9.2 mg/dL (ref 8.6–10.3)
Chloride: 99 mmol/L (ref 98–110)
Creat: 0.74 mg/dL (ref 0.70–1.35)
Globulin: 2.4 g/dL (ref 1.9–3.7)
Glucose, Bld: 90 mg/dL (ref 65–99)
Potassium: 4.6 mmol/L (ref 3.5–5.3)
Sodium: 132 mmol/L — ABNORMAL LOW (ref 135–146)
Total Bilirubin: 0.2 mg/dL (ref 0.2–1.2)
Total Protein: 6.8 g/dL (ref 6.1–8.1)
eGFR: 102 mL/min/{1.73_m2} (ref >=60)

## 2023-09-24 NOTE — Telephone Encounter (Signed)
 Rx needs to say 2 boxes for 10 doses for 30 days per sims

## 2023-09-24 NOTE — Progress Notes (Signed)
 CBC WNL Sodium is low-132. Rest of CMP WNL.  We will continue to monitor.

## 2023-09-25 ENCOUNTER — Telehealth: Payer: Self-pay

## 2023-09-25 MED ORDER — VALTOCO 15 MG DOSE 2 X 7.5 MG/0.1ML NA LQPK: NASAL | 5 refills | Status: DC

## 2023-09-25 NOTE — Telephone Encounter (Signed)
 Pt left a VM stating he was returning a call to [employer]

## 2023-09-25 NOTE — Telephone Encounter (Signed)
 See other phone note, Rx sent

## 2023-09-25 NOTE — Telephone Encounter (Signed)
 Pt called no answer left a voice mail to call the office back when he calls back we need to see what drug store told him that they could only get one box of the valtoco ?

## 2023-09-25 NOTE — Telephone Encounter (Signed)
 Pt would RX sent to [id number], [place] - [address]

## 2023-09-25 NOTE — Telephone Encounter (Signed)
 Rx sent.

## 2023-09-27 ENCOUNTER — Ambulatory Visit: Admitting: Neurology

## 2023-09-27 DIAGNOSIS — G40009 Localization-related (focal) (partial) idiopathic epilepsy and epileptic syndromes with seizures of localized onset, not intractable, without status epilepticus: Secondary | ICD-10-CM

## 2023-09-27 NOTE — Progress Notes (Signed)
 EEG complete and ready for review.

## 2023-10-06 NOTE — Procedures (Signed)
 ELECTROENCEPHALOGRAM REPORT  Date of Study: 09/27/2023  Patient's Name: Terry Guerrero MRN: [id number] Date of Birth: 1960/11/21  Referring Provider: Dr. Rayfield Cairo  Clinical History: This is a 63 year old man with recurrent episodes of loss of awareness. EEG for classification.  Medications: Oxcarbazepine , Zonisamide   Technical Summary: A multichannel digital EEG recording measured by the international 10-20 system with electrodes applied with paste and impedances below 5000 ohms performed in our laboratory with EKG monitoring in an awake and asleep patient.  Hyperventilation was not performed. Photic stimulation was performed.  The digital EEG was referentially recorded, reformatted, and digitally filtered in a variety of bipolar and referential montages for optimal display.    Description: The patient is awake and asleep during the recording.  During maximal wakefulness, there is a symmetric, medium voltage 8-9 Hz posterior dominant rhythm that attenuates with eye opening.  The record is symmetric.  During drowsiness and sleep, there is an increase in theta slowing of the background.  Vertex waves and symmetric sleep spindles were seen.Photic stimulation did not elicit any abnormalities.  There were no epileptiform discharges or electrographic seizures seen.    EKG lead was unremarkable.  Impression: This awake and asleep EEG is normal.    Clinical Correlation: A normal EEG does not exclude a clinical diagnosis of epilepsy.  If further clinical questions remain, prolonged EEG may be helpful.  Clinical correlation is advised.   Rayfield Cairo, M.D.

## 2023-10-08 ENCOUNTER — Other Ambulatory Visit: Payer: Self-pay

## 2023-10-08 ENCOUNTER — Encounter (HOSPITAL_COMMUNITY): Payer: Self-pay

## 2023-10-08 ENCOUNTER — Emergency Department (HOSPITAL_COMMUNITY)
Admission: EM | Admit: 2023-10-08 | Discharge: 2023-10-08 | Disposition: A | Discharge status: Home / Self Care | Attending: Emergency Medicine | Admitting: Emergency Medicine

## 2023-10-08 DIAGNOSIS — E871 Hypo-osmolality and hyponatremia: Secondary | ICD-10-CM

## 2023-10-08 DIAGNOSIS — Z950 Presence of cardiac pacemaker: Secondary | ICD-10-CM | POA: Diagnosis not present

## 2023-10-08 DIAGNOSIS — I959 Hypotension, unspecified: Secondary | ICD-10-CM | POA: Diagnosis not present

## 2023-10-08 DIAGNOSIS — G40909 Epilepsy, unspecified, not intractable, without status epilepticus: Secondary | ICD-10-CM

## 2023-10-08 DIAGNOSIS — R569 Unspecified convulsions: Secondary | ICD-10-CM | POA: Diagnosis not present

## 2023-10-08 LAB — CBC WITH DIFFERENTIAL/PLATELET
Abs Immature Granulocytes: 0.02 10*3/uL (ref 0.00–0.07)
Basophils Absolute: 0.1 10*3/uL (ref 0.0–0.1)
Basophils Relative: 1 %
Eosinophils Absolute: 0.2 10*3/uL (ref 0.0–0.5)
Eosinophils Relative: 3 %
HCT: 37.4 % — ABNORMAL LOW (ref 39.0–52.0)
Hemoglobin: 13 g/dL (ref 13.0–17.0)
Immature Granulocytes: 0 %
Lymphocytes Relative: 23 %
Lymphs Abs: 2.1 10*3/uL (ref 0.7–4.0)
MCH: 32.2 pg (ref 26.0–34.0)
MCHC: 34.8 g/dL (ref 30.0–36.0)
MCV: 92.6 fL (ref 80.0–100.0)
Monocytes Absolute: 0.7 10*3/uL (ref 0.1–1.0)
Monocytes Relative: 8 %
Neutro Abs: 6.1 10*3/uL (ref 1.7–7.7)
Neutrophils Relative %: 65 %
Platelets: 243 10*3/uL (ref 150–400)
RBC: 4.04 MIL/uL — ABNORMAL LOW (ref 4.22–5.81)
RDW: 13.7 % (ref 11.5–15.5)
WBC: 9.2 10*3/uL (ref 4.0–10.5)
nRBC: 0 % (ref 0.0–0.2)

## 2023-10-08 LAB — COMPREHENSIVE METABOLIC PANEL WITH GFR
ALT: 10 U/L (ref 0–44)
AST: 16 U/L (ref 15–41)
Albumin: 3.8 g/dL (ref 3.5–5.0)
Alkaline Phosphatase: 72 U/L (ref 38–126)
Anion gap: 8 (ref 5–15)
BUN: 6 mg/dL — ABNORMAL LOW (ref 8–23)
CO2: 21 mmol/L — ABNORMAL LOW (ref 22–32)
Calcium: 8.7 mg/dL — ABNORMAL LOW (ref 8.9–10.3)
Chloride: 96 mmol/L — ABNORMAL LOW (ref 98–111)
Creatinine, Ser: 0.8 mg/dL (ref 0.61–1.24)
GFR, Estimated: >60 mL/min (ref >60)
Glucose, Bld: 94 mg/dL (ref 70–99)
Potassium: 4.3 mmol/L (ref 3.5–5.1)
Sodium: 125 mmol/L — ABNORMAL LOW (ref 135–145)
Total Bilirubin: 0.6 mg/dL (ref 0.0–1.2)
Total Protein: 6.6 g/dL (ref 6.5–8.1)

## 2023-10-08 LAB — MAGNESIUM: Magnesium: 2.1 mg/dL (ref 1.7–2.4)

## 2023-10-08 MED ORDER — SODIUM CHLORIDE 0.9 % IV BOLUS
1000.0000 mL | Freq: Once | INTRAVENOUS | Status: AC
  Administered 2023-10-08: 1000 mL via INTRAVENOUS

## 2023-10-08 MED ORDER — LACOSAMIDE 100 MG PO TABS: 100.0000 mg | ORAL_TABLET | Freq: Two times a day (BID) | ORAL | 2 refills | Status: DC

## 2023-10-08 MED ORDER — LACOSAMIDE 50 MG PO TABS
200.0000 mg | ORAL_TABLET | Freq: Once | ORAL | Status: AC
  Administered 2023-10-08: 200 mg via ORAL
  Filled 2023-10-08: qty 4

## 2023-10-08 MED ORDER — VALTOCO 15 MG DOSE 2 X 7.5 MG/0.1ML NA LQPK: NASAL | 5 refills | Status: AC

## 2023-10-08 NOTE — Discharge Instructions (Addendum)
 Play start taking Vimpat 100 mg twice a day, your next dose will be tomorrow morning.  Please stop taking Trileptal .  You can continue taking the zonisamide  as prescribed by your neurologist.  Additionally please have your family doctor recheck your sodium level within 1 week  Please see your neurologist within 1 to 2 weeks.  Return to the ER for severe worsening seizures or symptoms

## 2023-10-08 NOTE — ED Provider Notes (Signed)
 Terry Guerrero Provider Note   CSN: [id number] Arrival date & time: 10/08/23  1833     Patient presents with: Seizures   Terry Guerrero is a 63 y.o. male.    Seizures  This patient is a 63 year old male with a known history of seizure disorder on Trileptal  and recently started on zonisamide  about oh month ago, the patient is followed by his gastroenterologist and takes Humira  for ulcerative colitis and arthritis and is followed by neurology, Dr. Ty Gales for the seizures.  The patient has been diagnosed with idiopathic epilepsy, he had an EEG that was performed 10 days ago, he has been having recurrent episodes of loss of awareness.  The EEG awake and asleep was classified as normal.  His last visit with the neurologist was on Sep 09, 2023, it was thought that he had some temporal lobe epilepsy, he had a pacemaker placement in February 2025 because of sinus node dysfunction and bradycardia with syncopal episodes, there was however 2-3 focal seizures per week in the past.  He had the Trileptal  increased to 1200 mg twice a day but started having dizziness and double vision.  They are trying to taper back on that and up on the zonisamide .  The paramedics found the patient to be postictal without seizure activity, the significant other gave him 15 mg of Valium  intranasal after watching a 20-minute seizure.  She reported to them that this was different than the usual seizures, the patient does not remember anything does not remember feeling bad and has no complaints at this time.  There is no tongue biting or urinary incontinence.  The patient had been on Keppra  in the past without improvement    Prior to Admission medications   Medication Sig Start Date End Date Taking? Authorizing Provider  Lacosamide (VIMPAT) 100 MG TABS Take 1 tablet (100 mg total) by mouth in the morning and at bedtime. 10/08/23 11/07/23 Yes Early Glisson, MD  adalimumab  (HUMIRA , 2  PEN,) 40 MG/0.4ML pen Inject 0.4 mLs (40 mg total) into the skin every 14 (fourteen) days. 09/11/23   Nicholas Bari, MD  diazePAM , 15 MG Dose, (VALTOCO  15 MG DOSE) 2 x 7.5 MG/0.1ML LQPK Administer 1 spray in 1 nostril, second spray in other nostril (one dose) for seizure longer than 5 minutes. May use second dose after 4 hours if needed 10/08/23   Early Glisson, MD  tiZANidine  (ZANAFLEX ) 4 MG tablet Take 1 tablet (4 mg total) by mouth every 8 (eight) hours as needed for muscle spasms. 09/20/23   Austine Lefort, MD  zonisamide  (ZONEGRAN ) 100 MG capsule Take 1 capsule every night for 2 weeks, then increase to 2 capsules every night for 2 weeks, then increase to 3 capsules every night 09/09/23   [name], MD    Allergies: Gold-containing drug products, Lamisil [terbinafine], Naprosyn [naproxen], and Oxycodone     Review of Systems  Neurological:  Positive for seizures.  All other systems reviewed and are negative.   Updated Vital Signs BP 101/71 (BP Location: Right Arm)   Pulse 66   Temp 98.5 F (36.9 C) (Oral)   Resp 18   Ht 1.676 m (5' 6)   Wt 63.5 kg   SpO2 98%   BMI 22.60 kg/m   Physical Exam Vitals and nursing note reviewed.  Constitutional:      General: He is not in acute distress.    Appearance: He is well-developed.  HENT:  Head: Normocephalic and atraumatic.     Mouth/Throat:     Pharynx: No oropharyngeal exudate.   Eyes:     General: No scleral icterus.       Right eye: No discharge.        Left eye: No discharge.     Conjunctiva/sclera: Conjunctivae normal.     Pupils: Pupils are equal, round, and reactive to light.   Neck:     Thyroid: No thyromegaly.     Vascular: No JVD.   Cardiovascular:     Rate and Rhythm: Normal rate and regular rhythm.     Heart sounds: Normal heart sounds. No murmur heard.    No friction rub. No gallop.  Pulmonary:     Effort: Pulmonary effort is normal. No respiratory distress.     Breath sounds: Normal breath  sounds. No wheezing or rales.  Abdominal:     General: Bowel sounds are normal. There is no distension.     Palpations: Abdomen is soft. There is no mass.     Tenderness: There is no abdominal tenderness.   Musculoskeletal:        General: No tenderness. Normal range of motion.     Cervical back: Normal range of motion and neck supple.  Lymphadenopathy:     Cervical: No cervical adenopathy.   Skin:    General: Skin is warm and dry.     Findings: No erythema or rash.   Neurological:     Mental Status: He is alert.     Coordination: Coordination normal.     Comments: Speech is clear, cranial nerves III through XII are intact, memory is intact, strength is normal in all 4 extremities including grips and strength at the bilateral thighs, knees and ankles to extention and flexion, sensation is intact to light touch and pinprick in all 4 extremities. Coordination as tested by finger-nose-finger is normal, no limb ataxia. Normal gait, normal reflexes at the patellar tendons bilaterally  Psychiatric:        Behavior: Behavior normal.     (all labs ordered are listed, but only abnormal results are displayed) Labs Reviewed  CBC WITH DIFFERENTIAL/PLATELET - Abnormal; Notable for the following components:      Result Value   RBC 4.04 (*)    HCT 37.4 (*)    All other components within normal limits  COMPREHENSIVE METABOLIC PANEL WITH GFR - Abnormal; Notable for the following components:   Sodium 125 (*)    Chloride 96 (*)    CO2 21 (*)    BUN 6 (*)    Calcium 8.7 (*)    All other components within normal limits  MAGNESIUM   MISC [facility] TEST (SEND OUT)  OXCARBAZEPINE  (TRILEPTAL ), SERUM    EKG: None  Radiology: No results found.   Procedures   Medications Ordered in the ED  lacosamide (VIMPAT) tablet 200 mg (has no administration in time range)  sodium chloride  0.9 % bolus 1,000 mL (1,000 mLs Intravenous New Bag/Given 10/08/23 2107)                                     Medical Decision Making Amount and/or Complexity of Data Reviewed Labs: ordered.  Risk Prescription drug management.    This patient presents to the ED for concern of seizure-like activity, this involves an extensive number of treatment options, and is a complaint that carries with it a high risk of  complications and morbidity.  The differential diagnosis includes recurrent seizures, hypoglycemia, alcohol  withdrawal, the patient reports that he only drinks about once a week or last   Co morbidities / Chronic conditions that complicate the patient evaluation  Known seizure disorder of some kind   Additional history obtained:  Additional history obtained from EMR External records from outside source obtained and reviewed including medical record, prior office notes, see HPI MRI from October 2022 did not reveal any abnormalities, no neuroimaging since that time EEGs as noted above   Lab Tests:  I Ordered, and personally interpreted labs.  The pertinent results include:  mild hyponatremia, CBC unremarkable, metabolic panel with normal renal function magnesium  normal, Trileptal  level sent to lab but not available at this visit   Cardiac Monitoring: / EKG:  The patient was maintained on a cardiac monitor.  I personally viewed and interpreted the cardiac monitored which showed an underlying rhythm of: Normal sinus rhythm, no tachycardia or arrhythmia   Problem List / ED Course / Critical interventions / Medication management  Appreciate Dr. Janeal Mealing for seeing this patient in consultation, there does not appear to be any further seizures in the emergency department in fact he is back to his baseline and feeling well I ordered medication including Vimpat 200 mg by mouth Reevaluation of the patient after these medicines showed that the patient no further seizures I have reviewed the patients home medicines and have made adjustments as needed   Consultations Obtained:  I requested  consultation with the neuro hospitalist.,  and discussed lab and imaging findings as well as pertinent plan - they recommend: conitnue the zonegran  updosing. Discontinuing Trileptal , starting Vimpat per Dr. Gerrit Krill rec's.   Social Determinants of Health:  epilepsy   Test / Admission - Considered:  Not needing admission Back to basline Neuro agrees with plan.   I have discussed with the patient at the bedside the results, and the meaning of these results.  They have had opportunity to ask questions,  expressed their understanding to the need for follow-up with primary care physician     Final diagnoses:  Seizure disorder (HCC)  Hyponatremia    ED Discharge Orders          Ordered    Lacosamide (VIMPAT) 100 MG TABS  2 times daily        10/08/23 2255    diazePAM , 15 MG Dose, (VALTOCO  15 MG DOSE) 2 x 7.5 MG/0.1ML LQPK       Note to Pharmacy: 2 boxes for 10 doses for 30 days. The prescription is written with disp quantity indicating the number of DOSES (1 box = 5 doses). Dispense entire box as a single unit. [facility] 16109-604-54   10/08/23 2255               Early Glisson, MD 10/08/23 2258

## 2023-10-08 NOTE — ED Triage Notes (Signed)
 Patient BIB [facility] from home after seizure witnessed by girlfriend on scene who gave 15mg  IN diazepam  PTA of EMS. Patient was initially postictal, now A&Ox4 but does not recall the event. Has hx of seizures and recent medication changes, GF also reports hx of seizures that required pacemaker and that the seizures are usually focal.

## 2023-10-08 NOTE — Consult Note (Signed)
 NEUROLOGY CONSULT NOTE   Date of service: October 08, 2023 Patient Name: Terry Guerrero MRN:  [id number] DOB:  1961-01-29 Chief Complaint: prolonged seizure Requesting Provider: Early Glisson, MD  History of Present Illness  Terry Guerrero is a 63 y.o. male with hx of epilepsy likely temporal lobe epilepsy, rheumatoid arthritis, ulcerative colitis on Humira , syncope with pacemaker in place  He was in his usual state of health today when he suddenly had a prolonged seizure; girlfriend gave him his Valtoco  after 20 minutes of ongoing seizure activity.  He gradually returned to baseline.  Spell #  - Semiology: Behavioral arrest, diaphoresis, stating uh-uh repeatedly - Prodome: No clear prodrome - Post-spell: Amnestic to the event - Triggers: No clear prodrome - Frequency: Typically 2 - 3 week, at best had a 30 to 40-day period of seizure freedom  Epilepsy Risk Factors:  He had a normal birth and early development.  There is no history of febrile convulsions, CNS infections such as meningitis/encephalitis, significant traumatic brain injury, neurosurgical procedures, or family history of seizures.   Prior ASMs: Keppra  -- ineffective  Oxcarbazepine , more effective but intolerable side effects with higher doses; today has developed hyponatremia  Current ASM  zonisamide  100 mg nightly with plans to increase gradually to 300 nightly    Diagnostic Data: MRI brain with and without contrast done 01/2021 normal, hippocampi symmetric with no abnormal signal or enhancement seen.   1-hour wake and sleep EEG in 01/2021 was normal.    ROS  Comprehensive ROS performed and pertinent positives documented in HPI   Past History   Past Medical History:  Diagnosis Date   Arthritis    Rheumatoid arthritis(714.0)    Seizures (HCC) 12/2020   Ulcerative colitis (HCC)     Past Surgical History:  Procedure Laterality Date   ANTERIOR CRUCIATE LIGAMENT REPAIR     Left knee   COLONOSCOPY     HERNIA  REPAIR  2020   umbilical   MOUTH SURGERY     Dentures   PACEMAKER IMPLANT N/A 05/29/2023   Procedure: PACEMAKER IMPLANT;  Surgeon: Tammie Fall, MD;  Location: [facility];  Service: Cardiovascular;  Laterality: N/A;   PACEMAKER INSERTION  05/29/2023   TOTAL KNEE ARTHROPLASTY Left 05/02/2022   Procedure: TOTAL KNEE ARTHROPLASTY;  Surgeon: Murleen Arms, MD;  Location: [facility];  Service: Orthopedics;  Laterality: Left;    Family History: Family History  Problem Relation Age of Onset   Alcoholism Mother    Colon cancer Neg Hx    Esophageal cancer Neg Hx    Stomach cancer Neg Hx    Rectal cancer Neg Hx     Social History  reports that he has been smoking cigarettes. He started smoking about 16 months ago. He has a 19.4 pack-year smoking history. He has never been exposed to tobacco smoke. He has quit using smokeless tobacco. He reports current alcohol  use. He reports current drug use. Drug: Marijuana.  Allergies  Allergen Reactions   Gold-Containing Drug Products Shortness Of Breath and Rash   Lamisil [Terbinafine] Other (See Comments)    The generic made his skin flaky and red itchy rash   Naprosyn [Naproxen]     Unknown reaction    Oxycodone  Nausea And Vomiting    Pt states that he cannot take oxycodone  because it causes upset stomach. He is able to tolerate hydrocodone  with no problem    Medications   Current Facility-Administered Medications:    lacosamide (VIMPAT) tablet  200 mg, 200 mg, Oral, Once, Terry Grant L, MD  Current Outpatient Medications:    adalimumab  (HUMIRA , 2 PEN,) 40 MG/0.4ML pen, Inject 0.4 mLs (40 mg total) into the skin every 14 (fourteen) days., Disp: 2 each, Rfl: 0   diazePAM , 15 MG Dose, (VALTOCO  15 MG DOSE) 2 x 7.5 MG/0.1ML LQPK, Administer 1 spray in 1 nostril, second spray in other nostril (one dose) for seizure longer than 5 minutes. May use second dose after 4 hours if needed, Disp: 10 each, Rfl: 5   oxcarbazepine  (TRILEPTAL )  600 MG tablet, Take 1 and 1/2 tablets twice a day, Disp: 270 tablet, Rfl: 3   tiZANidine  (ZANAFLEX ) 4 MG tablet, Take 1 tablet (4 mg total) by mouth every 8 (eight) hours as needed for muscle spasms., Disp: 30 tablet, Rfl: 0   zonisamide  (ZONEGRAN ) 100 MG capsule, Take 1 capsule every night for 2 weeks, then increase to 2 capsules every night for 2 weeks, then increase to 3 capsules every night, Disp: 90 capsule, Rfl: 6  Vitals   Vitals:   October 20, 2023 1837 10/20/23 1845 10/20/23 1851 2023/10/20 1852  BP:  101/71    Pulse:  66    Resp:  18    Temp:    98.5 F (36.9 C)  TempSrc:    Oral  SpO2: 96% 98%    Weight:   63.5 kg   Height:   5' 6 (1.676 m)     Body mass index is 22.6 kg/m.   Physical Exam   Constitutional: Appears well-developed and well-nourished.  Psych: Affect appropriate to situation, pleasant, eager to go home Eyes: No scleral injection HENT: No oropharyngeal obstruction.  MSK: Arthritic changes of the hand consistent with his diagnosis of rheumatoid arthritis Cardiovascular: Perfusing extremities well Respiratory: Effort normal, non-labored breathing GI: Soft.  No distension. There is no tenderness.  Skin: Warm dry and intact visible skin  Neurologic Examination   Mental Status: Patient is awake, alert, oriented to person, place, month, year, and situation. Patient is able to give a clear and coherent history. No signs of aphasia or neglect Cranial Nerves: II: Visual Fields are full.  III,IV, VI: EOMI without ptosis or diploplia.  V: Facial sensation is symmetric to light touch VII: Facial movement is symmetric.  VIII: hearing is intact to voice X: Uvula elevates symmetrically XI: Shoulder shrug is symmetric. XII: tongue is midline without atrophy or fasciculations.  Motor/gait: No pronator drift.  Able to rise on heels and toes.  Normal casual gait Sensory: Sensation is symmetric to light touch and temperature in the arms and  legs. Cerebellar: Finger-nose intact bilaterally    Labs/Imaging/Neurodiagnostic studies   CBC:  Recent Labs  Lab 2023-10-20 2007  WBC 9.2  NEUTROABS 6.1  HGB 13.0  HCT 37.4*  MCV 92.6  PLT 243   Basic Metabolic Panel:  Lab Results  Component Value Date   NA 125 (Guerrero) Oct 20, 2023   K 4.3 10-20-23   CO2 21 (Guerrero) October 20, 2023   GLUCOSE 94 10-20-2023   BUN 6 (Guerrero) October 20, 2023   CREATININE 0.80 2023/10/20   CALCIUM 8.7 (Guerrero) 10/20/23   GFRNONAA >60 10/20/2023   GFRAA 94 05/04/2019   Lipid Panel:  Lab Results  Component Value Date   LDLCALC 82 04/25/2023   HgbA1c: No results found for: HGBA1C Urine Drug Screen: No results found for: LABOPIA, COCAINSCRNUR, LABBENZ, AMPHETMU, THCU, LABBARB  Alcohol  Level     Component Value Date/Time   ETH <10 08/19/2022 1903   INR  Lab Results  Component Value Date   INR 1.0 08/19/2022    ASSESSMENT   BRENTT FREAD is a 63 y.o. male with epilepsy on oxcarbazepine  being transitioned to zonisamide , and in this setting has developed hyponatremia which is likely the trigger for his provoked prolonged seizure today.  As he is back to baseline and no need for EEG/MRI today, however will need to change antiseizure medications; oxcarbazepine  is associated with development of hyponatremia and he denies any other changes in medication or other medical issues that could explain the development of hyponatremia.   Despite his heart block, given he now has pacer in place, Vimpat will be a safe medication to trial  RECOMMENDATIONS  - STOP oxcarbazepine  - Start Vimpat, 200 mg oral load here today, then 100 mg BID starting tomorrow morning - Continue zonisamide  with taper as planned (approx day 10 of 100 mg dose today) - Close outpatient follow-up with Dr. Ty Gales, discussed with Dr. Annabell Key via secure chat and in person - Seizure precautions reviewed with patient and girlfriend at bedside and should be included in discharge  instructions ______________________________________________________________________  Baldwin Levee MD-PhD [facility] [phone] Available 7 AM to 7 PM, outside these hours please contact Neurologist on call listed on AMION

## 2023-10-10 ENCOUNTER — Ambulatory Visit: Payer: Self-pay | Admitting: Neurology

## 2023-10-10 ENCOUNTER — Telehealth: Payer: Self-pay

## 2023-10-10 ENCOUNTER — Other Ambulatory Visit: Payer: Self-pay

## 2023-10-10 DIAGNOSIS — G40909 Epilepsy, unspecified, not intractable, without status epilepticus: Secondary | ICD-10-CM

## 2023-10-10 LAB — MISC LABCORP TEST (SEND OUT): Labcorp test code: 7915

## 2023-10-10 NOTE — Telephone Encounter (Signed)
 Copied from CRM [id number]. Topic: Referral - Question >> Oct 10, 2023  1:18 PM Leory Rands wrote: Reason for CRM: Patient calling to request a referral for a new neurologist. Patient no longer wishes to she Dr. Ty Gales.  Patient is reporting that Dr. Ty Gales does not wish to change his medications for seizures. Patient feels that medications prescribed by Dr. Ty Gales are not helping. Please advise

## 2023-10-11 ENCOUNTER — Ambulatory Visit (INDEPENDENT_AMBULATORY_CARE_PROVIDER_SITE_OTHER): Payer: Medicare HMO

## 2023-10-11 DIAGNOSIS — I495 Sick sinus syndrome: Secondary | ICD-10-CM

## 2023-10-11 LAB — CUP PACEART REMOTE DEVICE CHECK
Battery Remaining Longevity: 180 mo
Battery Voltage: 3.21 V
Brady Statistic AP VP Percent: 0.01 %
Brady Statistic AP VS Percent: 4.82 %
Brady Statistic AS VP Percent: 0.06 %
Brady Statistic AS VS Percent: 95.11 %
Brady Statistic RA Percent Paced: 4.84 %
Brady Statistic RV Percent Paced: 0.08 %
Date Time Interrogation Session: 20250619223336
Implantable Lead Connection Status: 753985
Implantable Lead Connection Status: 753985
Implantable Lead Implant Date: 20250205
Implantable Lead Implant Date: 20250205
Implantable Lead Location: 753859
Implantable Lead Location: 753860
Implantable Lead Model: 5076
Implantable Lead Model: 5076
Implantable Pulse Generator Implant Date: 20250205
Lead Channel Impedance Value: 304 Ohm
Lead Channel Impedance Value: 456 Ohm
Lead Channel Impedance Value: 570 Ohm
Lead Channel Impedance Value: 608 Ohm
Lead Channel Pacing Threshold Amplitude: 0.875 V
Lead Channel Pacing Threshold Amplitude: 1.125 V
Lead Channel Pacing Threshold Pulse Width: 0.4 ms
Lead Channel Pacing Threshold Pulse Width: 0.4 ms
Lead Channel Sensing Intrinsic Amplitude: 2.5 mV
Lead Channel Sensing Intrinsic Amplitude: 2.5 mV
Lead Channel Sensing Intrinsic Amplitude: 29.5 mV
Lead Channel Sensing Intrinsic Amplitude: 29.5 mV
Lead Channel Setting Pacing Amplitude: 1.75 V
Lead Channel Setting Pacing Amplitude: 2 V
Lead Channel Setting Pacing Pulse Width: 0.4 ms
Lead Channel Setting Sensing Sensitivity: 1.2 mV
Zone Setting Status: 755011

## 2023-10-12 LAB — OXCARBAZEPINE (TRILEPTAL), SERUM: Oxcarbazepine Metabolite: 23 ug/mL (ref 10–35)

## 2023-10-13 ENCOUNTER — Ambulatory Visit: Payer: Self-pay | Admitting: Internal Medicine

## 2023-10-14 ENCOUNTER — Other Ambulatory Visit: Payer: Self-pay

## 2023-10-14 ENCOUNTER — Other Ambulatory Visit: Payer: Self-pay | Admitting: Rheumatology

## 2023-10-14 DIAGNOSIS — M0609 Rheumatoid arthritis without rheumatoid factor, multiple sites: Secondary | ICD-10-CM

## 2023-10-14 DIAGNOSIS — Z8719 Personal history of other diseases of the digestive system: Secondary | ICD-10-CM

## 2023-10-14 DIAGNOSIS — Z79899 Other long term (current) drug therapy: Secondary | ICD-10-CM

## 2023-10-14 MED ORDER — HUMIRA (2 PEN) 40 MG/0.4ML ~~LOC~~ AJKT
40.0000 mg | AUTO-INJECTOR | SUBCUTANEOUS | 2 refills | Status: DC
  Filled 2023-10-14 – 2023-10-16 (×2): qty 2, 28d supply, fill #0
  Filled 2023-11-11: qty 2, 28d supply, fill #1
  Filled 2023-12-05 – 2023-12-09 (×2): qty 2, 28d supply, fill #2

## 2023-10-14 NOTE — Telephone Encounter (Signed)
 Last Fill: 09/11/2023 (30 day supply)  Labs: 10/08/2023 Sodium 125, Chloride 96, CO2 21, BUN 6, Calcium 8.7, RBC 4.04, Hct 37.4  TB Gold: 11/07/2022 Neg    Next Visit: 02/25/2024  Last Visit: 09/23/2023  IK:Myzlfjunpi arthritis of multiple sites with negative rheumatoid factor   Current Dose per office note 09/23/2023: Humira  40 mg sq injections every 14 days   Okay to refill Humira ?

## 2023-10-16 ENCOUNTER — Other Ambulatory Visit: Payer: Self-pay

## 2023-10-16 ENCOUNTER — Encounter: Payer: Self-pay | Admitting: Neurology

## 2023-10-16 ENCOUNTER — Encounter (INDEPENDENT_AMBULATORY_CARE_PROVIDER_SITE_OTHER): Payer: Self-pay

## 2023-10-16 NOTE — Telephone Encounter (Signed)
 Pt called an informed that the EEG is normal, but it is not like a pregnancy test that is positive or negative, just a snapshot of brainwaves. Dr Georjean saw he was in the ER recently and they started a new medication, is he taking all 3 medications, Oxcarb, Vimpat , and Zonisamide ?  He is not taken the Oxcarb.  Pls confirm what he is taking and how he is feeling He is feeling ok, he said that being out in the heat got to him, he knows that the heat isnt good, he was at a benefit for a family member that passed away. He said that now when he is out side he is drinking a lot of water  and Gatorade,

## 2023-10-16 NOTE — Telephone Encounter (Signed)
Pt is returning a call to heather 

## 2023-10-16 NOTE — Progress Notes (Signed)
 Noted. Phone notes reviewed: Patient calling to request a referral for a new neurologist. Patient no longer wishes to she Dr. Georjean. Patient is reporting that Dr. Georjean does not wish to change his medications for seizures. Patient feels that medications prescribed by Dr. Georjean are not helping. Please advise   Patient has an appointment with Dr. Gregg at [facility]. Ok for patient dismissal as per patient request above.

## 2023-10-16 NOTE — Telephone Encounter (Signed)
-----   Message from Lanning LELON Crisp sent at 10/16/2023  1:52 PM EDT -----

## 2023-10-16 NOTE — Progress Notes (Signed)
 Specialty Pharmacy Refill Coordination Note  Terry Guerrero is a 63 y.o. male contacted today regarding refills of specialty medication(s) Adalimumab  (Humira  (2 Pen))   Patient requested Delivery   Delivery date: 10/17/23   Verified address: (Patient-Rptd) [address] [place] [address]   Medication will be filled on 06.25.25.

## 2023-11-12 ENCOUNTER — Other Ambulatory Visit: Payer: Self-pay

## 2023-11-12 ENCOUNTER — Other Ambulatory Visit (HOSPITAL_COMMUNITY): Payer: Self-pay

## 2023-11-13 ENCOUNTER — Telehealth: Payer: Self-pay | Admitting: Neurology

## 2023-11-13 MED ORDER — LACOSAMIDE 200 MG PO TABS: 200.0000 mg | ORAL_TABLET | Freq: Two times a day (BID) | ORAL | 3 refills | Status: DC

## 2023-11-13 NOTE — Telephone Encounter (Signed)
 Pt. Cld had Sx last night and wanted to speak to nurse

## 2023-11-13 NOTE — Telephone Encounter (Signed)
 He was started on Vimpat  in the hospital. We can increase the dose to 200mg  BID, continue Zonisamide . Which medication is affecting sleep, if Zonisamide , he can try taking it in the morning and see if this helps with sleep issues.

## 2023-11-13 NOTE — Telephone Encounter (Signed)
 Pt c/o: seizure Missed medications?  No Sleep deprived?  No. Actually got extra rest Alcohol  intake?  No Back to their usual baseline self?  Kind of. Just relaxing and resting today.  Current medications prescribed by Dr. Georjean: Zonegran , Vimpat , Valtoco .  Per patient. Paramedics came to his house last night after having a massive seizure. Patient feels the medication he was put on makes him have a reverse affect and is keeping him up.  During seizure patients wife did administer Valtoco  and pt began convulsing and foaming at the mouth. Paramedics came and checked him out put patient refused to go to hospital and signed a waiver to stay home.  Patient is aware I will send this message to Dr. Georjean and give him a call back once I hear back.

## 2023-11-13 NOTE — Telephone Encounter (Signed)
 Rx sent for Lacosamide  200mg  BID, thanks

## 2023-11-13 NOTE — Telephone Encounter (Signed)
 Called patient and asked him if he received a dismissal letter that was sent on 10/16/2023? Patient stated he never received that letter. I did inform patient that he was discharged due to wanting to see another neurologist. Patient stated he wanted a second opinion. I did let patient know to look out for the letter but I would also let Dr. Georjean know he did not receive it. I did let patient know we are still within the time frame of 30 days to help him.  Patient was advised per Dr. Georjean: He was started on Vimpat  in the hospital. We can increase the dose to 200mg  BID, continue Zonisamide . Which medication is affecting sleep, if Zonisamide , he can try taking it in the morning and see if this helps with sleep issues.   Patient verbalized understanding of all explained to him and also will call [facility] to try to move his appointment up. Patient had no further questions or concerns.

## 2023-11-15 ENCOUNTER — Other Ambulatory Visit: Payer: Self-pay

## 2023-11-15 ENCOUNTER — Other Ambulatory Visit (HOSPITAL_COMMUNITY): Payer: Self-pay

## 2023-11-15 NOTE — Progress Notes (Signed)
 Specialty Pharmacy Refill Coordination Note  Terry Guerrero is a 63 y.o. male contacted today regarding refills of specialty medication(s) Adalimumab  (Humira  (2 Pen))   Patient requested Delivery   Delivery date: 11/19/23   Verified address: [address] [place], [address]   Medication will be filled on 11/18/23.

## 2023-11-18 ENCOUNTER — Other Ambulatory Visit: Payer: Self-pay

## 2023-12-02 ENCOUNTER — Other Ambulatory Visit: Payer: Self-pay | Admitting: Family Medicine

## 2023-12-03 ENCOUNTER — Other Ambulatory Visit (HOSPITAL_COMMUNITY): Payer: Self-pay

## 2023-12-03 MED ORDER — TIZANIDINE HCL 4 MG PO TABS
4.0000 mg | ORAL_TABLET | Freq: Three times a day (TID) | ORAL | 0 refills | Status: DC | PRN
  Filled 2023-12-03: qty 30, 10d supply, fill #0

## 2023-12-05 ENCOUNTER — Other Ambulatory Visit: Payer: Self-pay

## 2023-12-06 NOTE — Progress Notes (Signed)
 Remote pacemaker transmission.

## 2023-12-07 ENCOUNTER — Encounter (INDEPENDENT_AMBULATORY_CARE_PROVIDER_SITE_OTHER): Payer: Self-pay

## 2023-12-09 ENCOUNTER — Other Ambulatory Visit: Payer: Self-pay

## 2023-12-09 NOTE — Progress Notes (Signed)
 Specialty Pharmacy Refill Coordination Note  Terry Guerrero is a 63 y.o. male contacted today regarding refills of specialty medication(s) Adalimumab  (Humira  (2 Pen))   Patient requested Delivery   Delivery date: 12/11/23   Verified address: [address]  [place] [address]   Medication will be filled on 08.19.25.

## 2023-12-13 ENCOUNTER — Encounter: Payer: Self-pay | Admitting: Radiology

## 2023-12-20 ENCOUNTER — Ambulatory Visit: Admitting: Neurology

## 2023-12-27 ENCOUNTER — Other Ambulatory Visit: Payer: Self-pay | Admitting: Family Medicine

## 2023-12-30 ENCOUNTER — Other Ambulatory Visit (HOSPITAL_COMMUNITY): Payer: Self-pay

## 2023-12-30 MED ORDER — TIZANIDINE HCL 4 MG PO TABS
4.0000 mg | ORAL_TABLET | Freq: Three times a day (TID) | ORAL | 0 refills | Status: DC | PRN
  Filled 2023-12-30: qty 30, 10d supply, fill #0

## 2023-12-30 NOTE — Telephone Encounter (Signed)
 Requested medication (s) are due for refill today: yes  Requested medication (s) are on the active medication list: yes  Last refill:  12/03/23  Future visit scheduled: no  Notes to clinic:  Unable to refill per protocol, cannot delegate.      Requested Prescriptions  Pending Prescriptions Disp Refills   tiZANidine  (ZANAFLEX ) 4 MG tablet 30 tablet 0    Sig: Take 1 tablet (4 mg total) by mouth every 8 (eight) hours as needed for muscle spasms.     Not Delegated - Cardiovascular:  Alpha-2 Agonists - tizanidine  Failed - 12/30/2023  9:57 AM      Failed - This refill cannot be delegated      Failed - Valid encounter within last 6 months    Recent Outpatient Visits           5 months ago Strain of lumbar region, initial encounter   [facility] Kayla Jeoffrey RAMAN, FNP   1 year ago Otomycosis of right ear   [place] [facility] Kayla Jeoffrey RAMAN, FNP   1 year ago Closed stable burst fracture of first lumbar vertebra with routine healing, subsequent encounter   [place] [facility] Duanne Butler DASEN, MD   2 years ago Acute pain of left knee   [facility] Pickard, Butler DASEN, MD       Future Appointments             In 1 month Deveshwar, Maya, MD [facility] - A Dept Of [place]

## 2023-12-31 ENCOUNTER — Other Ambulatory Visit: Payer: Self-pay

## 2023-12-31 ENCOUNTER — Other Ambulatory Visit: Payer: Self-pay | Admitting: Physician Assistant

## 2023-12-31 ENCOUNTER — Encounter (INDEPENDENT_AMBULATORY_CARE_PROVIDER_SITE_OTHER): Payer: Self-pay

## 2023-12-31 ENCOUNTER — Other Ambulatory Visit (HOSPITAL_COMMUNITY): Payer: Self-pay

## 2023-12-31 DIAGNOSIS — Z79899 Other long term (current) drug therapy: Secondary | ICD-10-CM

## 2023-12-31 DIAGNOSIS — M0609 Rheumatoid arthritis without rheumatoid factor, multiple sites: Secondary | ICD-10-CM

## 2023-12-31 DIAGNOSIS — Z8719 Personal history of other diseases of the digestive system: Secondary | ICD-10-CM

## 2023-12-31 MED ORDER — HUMIRA (2 PEN) 40 MG/0.4ML ~~LOC~~ AJKT
40.0000 mg | AUTO-INJECTOR | SUBCUTANEOUS | 0 refills | Status: DC
  Filled 2023-12-31 – 2024-01-01 (×3): qty 2, 28d supply, fill #0

## 2023-12-31 NOTE — Telephone Encounter (Signed)
 Last Fill: 10/14/2023  Labs: 10/08/2023 RBC 4.04 HCT 37.4 Sodium 125 Chloride 96 CO2 21 BUN 6 Calcium 8.7  TB Gold: 11/07/2022 TB Gold Negative    Next Visit: 02/25/2024  Last Visit: 09/23/2023  DX: Rheumatoid arthritis of multiple sites with negative rheumatoid factor   Current Dose per office note 09/23/2023: Humira  40 mg sq injections every 14 days   Okay to refill Humira ?   LMOM for patient to update TB Gold, provided labs hours

## 2024-01-01 ENCOUNTER — Other Ambulatory Visit (HOSPITAL_COMMUNITY): Payer: Self-pay

## 2024-01-01 ENCOUNTER — Other Ambulatory Visit: Payer: Self-pay

## 2024-01-01 NOTE — Progress Notes (Signed)
 Specialty Pharmacy Refill Coordination Note  MyChart Questionnaire Submission  Terry Guerrero is a 63 y.o. male contacted today regarding refills of specialty medication(s) Humira .  Doses on hand: (Patient-Rptd) 0   Injection date: (Patient-Rptd) 01/14/24  Patient requested: (Patient-Rptd) Delivery   Delivery date: 01/03/24  Verified address: [address] [place] [address]  Medication will be filled on 01/02/24.

## 2024-01-10 ENCOUNTER — Ambulatory Visit (INDEPENDENT_AMBULATORY_CARE_PROVIDER_SITE_OTHER): Payer: Medicare HMO

## 2024-01-10 DIAGNOSIS — I495 Sick sinus syndrome: Secondary | ICD-10-CM

## 2024-01-10 LAB — CUP PACEART REMOTE DEVICE CHECK
Battery Remaining Longevity: 177 mo
Battery Voltage: 3.18 V
Brady Statistic AP VP Percent: 0.01 %
Brady Statistic AP VS Percent: 8.69 %
Brady Statistic AS VP Percent: 0.05 %
Brady Statistic AS VS Percent: 91.25 %
Brady Statistic RA Percent Paced: 8.72 %
Brady Statistic RV Percent Paced: 0.06 %
Date Time Interrogation Session: 20250918221429
Implantable Lead Connection Status: 753985
Implantable Lead Connection Status: 753985
Implantable Lead Implant Date: 20250205
Implantable Lead Implant Date: 20250205
Implantable Lead Location: 753859
Implantable Lead Location: 753860
Implantable Lead Model: 5076
Implantable Lead Model: 5076
Implantable Pulse Generator Implant Date: 20250205
Lead Channel Impedance Value: 266 Ohm
Lead Channel Impedance Value: 399 Ohm
Lead Channel Impedance Value: 399 Ohm
Lead Channel Impedance Value: 494 Ohm
Lead Channel Pacing Threshold Amplitude: 0.625 V
Lead Channel Pacing Threshold Amplitude: 0.875 V
Lead Channel Pacing Threshold Pulse Width: 0.4 ms
Lead Channel Pacing Threshold Pulse Width: 0.4 ms
Lead Channel Sensing Intrinsic Amplitude: 24.5 mV
Lead Channel Sensing Intrinsic Amplitude: 24.5 mV
Lead Channel Sensing Intrinsic Amplitude: 3.125 mV
Lead Channel Sensing Intrinsic Amplitude: 3.125 mV
Lead Channel Setting Pacing Amplitude: 1.5 V
Lead Channel Setting Pacing Amplitude: 2 V
Lead Channel Setting Pacing Pulse Width: 0.4 ms
Lead Channel Setting Sensing Sensitivity: 1.2 mV
Zone Setting Status: 755011

## 2024-01-14 NOTE — Progress Notes (Signed)
 Remote PPM Transmission

## 2024-01-18 ENCOUNTER — Ambulatory Visit: Payer: Self-pay | Admitting: Internal Medicine

## 2024-01-27 ENCOUNTER — Ambulatory Visit: Admitting: Neurology

## 2024-01-27 ENCOUNTER — Encounter: Payer: Self-pay | Admitting: Neurology

## 2024-01-27 VITALS — BP 133/81 | HR 89 | Resp 14 | Ht 66.0 in | Wt 128.5 lb

## 2024-01-27 DIAGNOSIS — R569 Unspecified convulsions: Secondary | ICD-10-CM | POA: Diagnosis not present

## 2024-01-27 NOTE — Patient Instructions (Signed)
 Decrease zonisamide  to 200 mg daily Continue with Vimpat  200 mg twice daily Will obtain a 3-day ambulatory EEG, if negative will consider EMU admission Continue your other medications Contact me sooner if worse
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
## 2024-01-28 ENCOUNTER — Other Ambulatory Visit: Payer: Self-pay

## 2024-01-28 ENCOUNTER — Other Ambulatory Visit: Payer: Self-pay | Admitting: *Deleted

## 2024-01-28 ENCOUNTER — Other Ambulatory Visit: Payer: Self-pay | Admitting: Physician Assistant

## 2024-01-28 ENCOUNTER — Telehealth: Payer: Self-pay

## 2024-01-28 DIAGNOSIS — Z8719 Personal history of other diseases of the digestive system: Secondary | ICD-10-CM

## 2024-01-28 DIAGNOSIS — M0609 Rheumatoid arthritis without rheumatoid factor, multiple sites: Secondary | ICD-10-CM

## 2024-01-28 DIAGNOSIS — Z79899 Other long term (current) drug therapy: Secondary | ICD-10-CM

## 2024-01-28 NOTE — Telephone Encounter (Signed)
 [name]

## 2024-02-03 ENCOUNTER — Encounter (INDEPENDENT_AMBULATORY_CARE_PROVIDER_SITE_OTHER): Payer: Self-pay

## 2024-02-03 ENCOUNTER — Other Ambulatory Visit: Payer: Self-pay

## 2024-02-03 ENCOUNTER — Other Ambulatory Visit: Payer: Self-pay | Admitting: Physician Assistant

## 2024-02-03 DIAGNOSIS — M0609 Rheumatoid arthritis without rheumatoid factor, multiple sites: Secondary | ICD-10-CM

## 2024-02-03 DIAGNOSIS — Z111 Encounter for screening for respiratory tuberculosis: Secondary | ICD-10-CM

## 2024-02-03 DIAGNOSIS — Z79899 Other long term (current) drug therapy: Secondary | ICD-10-CM

## 2024-02-03 DIAGNOSIS — Z8719 Personal history of other diseases of the digestive system: Secondary | ICD-10-CM

## 2024-02-04 ENCOUNTER — Other Ambulatory Visit: Payer: Self-pay

## 2024-02-04 ENCOUNTER — Other Ambulatory Visit (HOSPITAL_COMMUNITY): Payer: Self-pay

## 2024-02-04 ENCOUNTER — Ambulatory Visit: Payer: Self-pay | Admitting: Rheumatology

## 2024-02-04 ENCOUNTER — Encounter (HOSPITAL_COMMUNITY): Payer: Self-pay

## 2024-02-04 ENCOUNTER — Other Ambulatory Visit: Payer: Self-pay | Admitting: *Deleted

## 2024-02-04 DIAGNOSIS — M0609 Rheumatoid arthritis without rheumatoid factor, multiple sites: Secondary | ICD-10-CM

## 2024-02-04 DIAGNOSIS — Z8719 Personal history of other diseases of the digestive system: Secondary | ICD-10-CM

## 2024-02-04 DIAGNOSIS — Z79899 Other long term (current) drug therapy: Secondary | ICD-10-CM

## 2024-02-04 MED ORDER — HUMIRA (2 PEN) 40 MG/0.4ML ~~LOC~~ AJKT
40.0000 mg | AUTO-INJECTOR | SUBCUTANEOUS | 2 refills | Status: DC
  Filled 2024-02-04: qty 2, 28d supply, fill #0
  Filled 2024-02-24 – 2024-02-27 (×2): qty 2, 28d supply, fill #1
  Filled 2024-03-23 (×2): qty 2, 28d supply, fill #2

## 2024-02-04 MED ORDER — HUMIRA (2 PEN) 40 MG/0.4ML ~~LOC~~ AJKT: 40.0000 mg | AUTO-INJECTOR | SUBCUTANEOUS | 2 refills | Status: DC

## 2024-02-04 NOTE — Addendum Note (Signed)
 Addended by: DAYNE SHERRY RAMAN on: 02/04/2024 11:27 AM   Modules accepted: Orders

## 2024-02-04 NOTE — Telephone Encounter (Signed)
 Last Fill: 12/31/2023  Labs: 02/03/2024 Hemoglobin is low. Patient should take multivitamin with iron. CMP is normal.   TB Gold: 11/07/2022 Neg (updated 02/03/2024, results pending)   Next Visit: 02/25/2024  Last Visit: 09/23/2023  IK:Myzlfjunpi arthritis of multiple sites with negative rheumatoid factor   Current Dose per office note 09/23/2023: Humira  40 mg sq injections every 14 days   Okay to refill Humira ?

## 2024-02-04 NOTE — Progress Notes (Signed)
 Specialty Pharmacy Refill Coordination Note  Terry Guerrero is a 63 y.o. male contacted today regarding refills of specialty medication(s) Adalimumab  (Humira  (2 Pen))   Patient requested (Patient-Rptd) Delivery   Delivery date: 02/06/24   Verified address: [address] [place] [address]   Medication will be filled on 02/05/24.

## 2024-02-04 NOTE — Progress Notes (Signed)
Hemoglobin is low.  Patient should take multivitamin with iron.  CMP is normal.

## 2024-02-04 NOTE — Telephone Encounter (Signed)
 Rx re-sent to [facility].  Called [facility] pharmacy and discontinued rx to ensure that there is no RTS rejection  Sherry Pennant, PharmD, MPH, BCPS, CPP Clinical Pharmacist [facility])

## 2024-02-05 LAB — COMPREHENSIVE METABOLIC PANEL WITH GFR
AG Ratio: 2 (calc) (ref 1.0–2.5)
ALT: 14 U/L (ref 9–46)
AST: 18 U/L (ref 10–35)
Albumin: 4.3 g/dL (ref 3.6–5.1)
Alkaline phosphatase (APISO): 69 U/L (ref 35–144)
BUN: 8 mg/dL (ref 7–25)
CO2: 24 mmol/L (ref 20–32)
Calcium: 8.8 mg/dL (ref 8.6–10.3)
Chloride: 108 mmol/L (ref 98–110)
Creat: 0.8 mg/dL (ref 0.70–1.35)
Globulin: 2.2 g/dL (ref 1.9–3.7)
Glucose, Bld: 98 mg/dL (ref 65–99)
Potassium: 4 mmol/L (ref 3.5–5.3)
Sodium: 136 mmol/L (ref 135–146)
Total Bilirubin: 0.3 mg/dL (ref 0.2–1.2)
Total Protein: 6.5 g/dL (ref 6.1–8.1)
eGFR: 100 mL/min/1.73m2 (ref >=60)

## 2024-02-05 LAB — CBC WITH DIFFERENTIAL/PLATELET
Absolute Lymphocytes: 3019 {cells}/uL (ref 850–3900)
Absolute Monocytes: 725 {cells}/uL (ref 200–950)
Basophils Absolute: 78 {cells}/uL (ref 0–200)
Basophils Relative: 1 %
Eosinophils Absolute: 452 {cells}/uL (ref 15–500)
Eosinophils Relative: 5.8 %
HCT: 36.8 % — ABNORMAL LOW (ref 38.5–50.0)
Hemoglobin: 12.6 g/dL — ABNORMAL LOW (ref 13.2–17.1)
MCH: 33.1 pg — ABNORMAL HIGH (ref 27.0–33.0)
MCHC: 34.2 g/dL (ref 32.0–36.0)
MCV: 96.6 fL (ref 80.0–100.0)
MPV: 10 fL (ref 7.5–12.5)
Monocytes Relative: 9.3 %
Neutro Abs: 3526 {cells}/uL (ref 1500–7800)
Neutrophils Relative %: 45.2 %
Platelets: 275 Thousand/uL (ref 140–400)
RBC: 3.81 Million/uL — ABNORMAL LOW (ref 4.20–5.80)
RDW: 12.6 % (ref 11.0–15.0)
Total Lymphocyte: 38.7 %
WBC: 7.8 Thousand/uL (ref 3.8–10.8)

## 2024-02-05 LAB — QUANTIFERON-TB GOLD PLUS
Mitogen-NIL: >10 [IU]/mL
NIL: 0.02 [IU]/mL
QuantiFERON-TB Gold Plus: NEGATIVE
TB1-NIL: <0 [IU]/mL
TB2-NIL: <0 [IU]/mL

## 2024-02-05 NOTE — Progress Notes (Signed)
 TB gold negative

## 2024-02-05 NOTE — Progress Notes (Signed)
 TB Gold is negative.

## 2024-02-07 DIAGNOSIS — G40909 Epilepsy, unspecified, not intractable, without status epilepticus: Secondary | ICD-10-CM | POA: Diagnosis not present

## 2024-02-08 DIAGNOSIS — G40909 Epilepsy, unspecified, not intractable, without status epilepticus: Secondary | ICD-10-CM | POA: Diagnosis not present

## 2024-02-09 DIAGNOSIS — G40909 Epilepsy, unspecified, not intractable, without status epilepticus: Secondary | ICD-10-CM | POA: Diagnosis not present

## 2024-02-10 DIAGNOSIS — G40909 Epilepsy, unspecified, not intractable, without status epilepticus: Secondary | ICD-10-CM | POA: Diagnosis not present
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
## 2024-02-17 ENCOUNTER — Ambulatory Visit: Payer: Self-pay | Admitting: Neurology

## 2024-02-17 ENCOUNTER — Encounter (INDEPENDENT_AMBULATORY_CARE_PROVIDER_SITE_OTHER): Payer: Self-pay | Admitting: Neurology

## 2024-02-17 DIAGNOSIS — R569 Unspecified convulsions: Secondary | ICD-10-CM
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
## 2024-02-24 ENCOUNTER — Other Ambulatory Visit: Payer: Self-pay

## 2024-02-24 ENCOUNTER — Encounter: Payer: Self-pay | Admitting: Radiology

## 2024-02-25 ENCOUNTER — Ambulatory Visit: Attending: Rheumatology | Admitting: Rheumatology

## 2024-02-25 ENCOUNTER — Encounter: Payer: Self-pay | Admitting: Rheumatology

## 2024-02-25 VITALS — BP 124/84 | HR 81 | Temp 97.8°F | Resp 16 | Ht 66.0 in | Wt 129.6 lb

## 2024-02-25 DIAGNOSIS — M79671 Pain in right foot: Secondary | ICD-10-CM | POA: Diagnosis not present

## 2024-02-25 DIAGNOSIS — Z13220 Encounter for screening for lipoid disorders: Secondary | ICD-10-CM

## 2024-02-25 DIAGNOSIS — R55 Syncope and collapse: Secondary | ICD-10-CM | POA: Diagnosis not present

## 2024-02-25 DIAGNOSIS — F17200 Nicotine dependence, unspecified, uncomplicated: Secondary | ICD-10-CM

## 2024-02-25 DIAGNOSIS — Z79899 Other long term (current) drug therapy: Secondary | ICD-10-CM

## 2024-02-25 DIAGNOSIS — Z96652 Presence of left artificial knee joint: Secondary | ICD-10-CM | POA: Diagnosis not present

## 2024-02-25 DIAGNOSIS — M47816 Spondylosis without myelopathy or radiculopathy, lumbar region: Secondary | ICD-10-CM | POA: Diagnosis not present

## 2024-02-25 DIAGNOSIS — Z8719 Personal history of other diseases of the digestive system: Secondary | ICD-10-CM | POA: Diagnosis not present

## 2024-02-25 DIAGNOSIS — M79641 Pain in right hand: Secondary | ICD-10-CM | POA: Diagnosis not present

## 2024-02-25 DIAGNOSIS — G40109 Localization-related (focal) (partial) symptomatic epilepsy and epileptic syndromes with simple partial seizures, not intractable, without status epilepticus: Secondary | ICD-10-CM

## 2024-02-25 DIAGNOSIS — Z950 Presence of cardiac pacemaker: Secondary | ICD-10-CM | POA: Diagnosis not present

## 2024-02-25 DIAGNOSIS — M79642 Pain in left hand: Secondary | ICD-10-CM

## 2024-02-25 DIAGNOSIS — S32011D Stable burst fracture of first lumbar vertebra, subsequent encounter for fracture with routine healing: Secondary | ICD-10-CM

## 2024-02-25 DIAGNOSIS — M0609 Rheumatoid arthritis without rheumatoid factor, multiple sites: Secondary | ICD-10-CM | POA: Diagnosis not present

## 2024-02-25 DIAGNOSIS — M79672 Pain in left foot: Secondary | ICD-10-CM

## 2024-02-25 DIAGNOSIS — M8589 Other specified disorders of bone density and structure, multiple sites: Secondary | ICD-10-CM

## 2024-02-25 NOTE — Patient Instructions (Signed)
 Standing Labs We placed an order today for your standing lab work.   Please have your standing labs drawn in January and every 3 months  Please have your labs drawn 2 weeks prior to your appointment so that the provider can discuss your lab results at your appointment, if possible.  Please note that you may see your imaging and lab results in MyChart before we have reviewed them. We will contact you once all results are reviewed. Please allow our office up to 72 hours to thoroughly review all of the results before contacting the office for clarification of your results.  WALK-IN LAB HOURS  Monday through Thursday from 8:00 am -12:30 pm and 1:00 pm-4:30 pm and Friday from 8:00 am-12:00 pm.  Patients with office visits requiring labs will be seen before walk-in labs.  You may encounter longer than normal wait times. Please allow additional time. Wait times may be shorter on  Monday and Thursday afternoons.  We do not book appointments for walk-in labs. We appreciate your patience and understanding with our staff.   Labs are drawn by [facility]. Please bring your co-pay at the time of your lab draw.  You may receive a bill from [facility] for your lab work.  Please note if you are on Hydroxychloroquine and and an order has been placed for a Hydroxychloroquine level,  you will need to have it drawn 4 hours or more after your last dose.  If you wish to have your labs drawn at another location, please call the office 24 hours in advance so we can fax the orders.  The office is located at [address], [address], [place], [place] [address]   If you have any questions regarding directions or hours of operation,  please call [phone].   As a reminder, please drink plenty of water  prior to coming for your lab work. Thanks!   Vaccines You are taking a medication(s) that can suppress your immune system.  The following immunizations are recommended: Flu annually ( High dose) Covid-19  Td/Tdap  (tetanus, diphtheria, pertussis) every 10 years Pneumonia (Prevnar 15 then Pneumovax 23 at least 1 year apart.  Alternatively, can take Prevnar 20 without needing additional dose) Shingrix: 2 doses from 4 weeks to 6 months apart  Please check with your PCP to make sure you are up to date.   If you have signs or symptoms of an infection or start antibiotics: First, call your PCP for workup of your infection. Hold your medication through the infection, until you complete your antibiotics, and until symptoms resolve if you take the following: Injectable medication (Actemra, Benlysta, Cimzia, Cosentyx, Enbrel, Humira , Kevzara, Orencia, Remicade, Simponi, Stelara, Taltz, Tremfya) Methotrexate Leflunomide (Arava) Mycophenolate (Cellcept) Earma Jewel, or Rinvoq  Get an annual skin examination to screen for skin cancer while you are on Humira .  Please use sunscreen and sun protection.  Managing the Challenge of Quitting Smoking Quitting smoking is a physical and mental challenge. You may have cravings, withdrawal symptoms, and temptation to smoke. Before quitting, work with your health care provider to make a plan that can help you manage quitting. Making a plan before you quit may keep you from smoking when you have the urge to smoke while trying to quit. How to manage lifestyle changes Managing stress Stress can make you want to smoke, and wanting to smoke may cause stress. It is important to find ways to manage your stress. You could try some of the following: Practice relaxation techniques. Breathe slowly  and deeply, in through your nose and out through your mouth. Listen to music. Soak in a bath or take a shower. Imagine a peaceful place or vacation. Get some support. Talk with family or friends about your stress. Join a support group. Talk with a counselor or therapist. Get some physical activity. Go for a walk, run, or bike ride. Play a favorite sport. Practice  yoga.  Medicines Talk with your health care provider about medicines that might help you deal with cravings and make quitting easier for you. Relationships Social situations can be difficult when you are quitting smoking. To manage this, you can: Avoid parties and other social situations where people might be smoking. Avoid alcohol . Leave right away if you have the urge to smoke. Explain to your family and friends that you are quitting smoking. Ask for support and let them know you might be a bit grumpy. Plan activities where smoking is not an option. General instructions Be aware that many people gain weight after they quit smoking. However, not everyone does. To keep from gaining weight, have a plan in place before you quit, and stick to the plan after you quit. Your plan should include: Eating healthy snacks. When you have a craving, it may help to: Eat popcorn, or try carrots, celery, or other cut vegetables. Chew sugar-free gum. Changing how you eat. Eat small portion sizes at meals. Eat 4-6 small meals throughout the day instead of 1-2 large meals a day. Be mindful when you eat. You should avoid watching television or doing other things that might distract you as you eat. Exercising regularly. Make time to exercise each day. If you do not have time for a long workout, do short bouts of exercise for 5-10 minutes several times a day. Do some form of strengthening exercise, such as weight lifting. Do some exercise that gets your heart beating and causes you to breathe deeply, such as walking fast, running, swimming, or biking. This is very important. Drinking plenty of water  or other low-calorie or no-calorie drinks. Drink enough fluid to keep your urine pale yellow.  How to recognize withdrawal symptoms Your body and mind may experience discomfort as you try to get used to not having nicotine in your system. These effects are called withdrawal symptoms. They may include: Feeling  hungrier than normal. Having trouble concentrating. Feeling irritable or restless. Having trouble sleeping. Feeling depressed. Craving a cigarette. These symptoms may surprise you, but they are normal to have when quitting smoking. To manage withdrawal symptoms: Avoid places, people, and activities that trigger your cravings. Remember why you want to quit. Get plenty of sleep. Avoid coffee and other drinks that contain caffeine. These may worsen some of your symptoms. How to manage cravings Come up with a plan for how to deal with your cravings. The plan should include the following: A definition of the specific situation you want to deal with. An activity or action you will take to replace smoking. A clear idea for how this action will help. The name of someone who could help you with this. Cravings usually last for 5-10 minutes. Consider taking the following actions to help you with your plan to deal with cravings: Keep your mouth busy. Chew sugar-free gum. Suck on hard candies or a straw. Brush your teeth. Keep your hands and body busy. Change to a different activity right away. Squeeze or play with a ball. Do an activity or a hobby, such as making bead jewelry, practicing needlepoint, or working  with wood. Mix up your normal routine. Take a short exercise break. Go for a quick walk, or run up and down stairs. Focus on doing something kind or helpful for someone else. Call a friend or family member to talk during a craving. Join a support group. Contact a quitline. Where to find support To get help or find a support group: Call the National Cancer Institute's Smoking Quitline: 1-800-QUIT-NOW 478-407-6273) Text QUIT to SmokefreeTXT: 521151 Where to find more information Visit these websites to find more information on quitting smoking: U.S. Department of Health and Human Services: www.smokefree.gov American Lung Association: www.freedomfromsmoking.org Centers for Disease  Control and Prevention (CDC): footballexhibition.com.br American Heart Association: www.heart.org Contact a health care provider if: You want to change your plan for quitting. The medicines you are taking are not helping. Your eating feels out of control or you cannot sleep. You feel depressed or become very anxious. Summary Quitting smoking is a physical and mental challenge. You will face cravings, withdrawal symptoms, and temptation to smoke again. Preparation can help you as you go through these challenges. Try different techniques to manage stress, handle social situations, and prevent weight gain. You can deal with cravings by keeping your mouth busy (such as by chewing gum), keeping your hands and body busy, calling family or friends, or contacting a quitline for people who want to quit smoking. You can deal with withdrawal symptoms by avoiding places where people smoke, getting plenty of rest, and avoiding drinks that contain caffeine. This information is not intended to replace advice given to you by your health care provider. Make sure you discuss any questions you have with your health care provider. Document Revised: 03/31/2021 Document Reviewed: 03/31/2021 Elsevier Patient Education  2024 [employer].

## 2024-02-27 ENCOUNTER — Other Ambulatory Visit: Payer: Self-pay

## 2024-02-27 ENCOUNTER — Encounter (INDEPENDENT_AMBULATORY_CARE_PROVIDER_SITE_OTHER): Payer: Self-pay

## 2024-02-27 ENCOUNTER — Other Ambulatory Visit: Payer: Self-pay | Admitting: Pharmacy Technician

## 2024-02-27 NOTE — Progress Notes (Signed)
 Specialty Pharmacy Refill Coordination Note  Terry Guerrero is a 63 y.o. male contacted today regarding refills of specialty medication(s) Adalimumab  (Humira  (2 Pen))   Patient requested (Patient-Rptd) Delivery   Delivery date: 03/03/2024 Verified address: (Patient-Rptd) [address] [place] [address]   Medication will be filled on: 03/02/2024

## 2024-03-02 ENCOUNTER — Other Ambulatory Visit: Payer: Self-pay

## 2024-03-08 ENCOUNTER — Encounter (HOSPITAL_COMMUNITY): Payer: Self-pay

## 2024-03-08 ENCOUNTER — Other Ambulatory Visit: Payer: Self-pay

## 2024-03-08 ENCOUNTER — Emergency Department (HOSPITAL_COMMUNITY)
Admission: EM | Admit: 2024-03-08 | Discharge: 2024-03-08 | Disposition: A | Discharge status: Home / Self Care | Attending: Emergency Medicine | Admitting: Emergency Medicine

## 2024-03-08 DIAGNOSIS — R569 Unspecified convulsions: Secondary | ICD-10-CM | POA: Diagnosis not present

## 2024-03-08 DIAGNOSIS — R410 Disorientation, unspecified: Secondary | ICD-10-CM | POA: Diagnosis not present

## 2024-03-08 DIAGNOSIS — G40909 Epilepsy, unspecified, not intractable, without status epilepticus: Secondary | ICD-10-CM | POA: Diagnosis not present

## 2024-03-08 DIAGNOSIS — R9431 Abnormal electrocardiogram [ECG] [EKG]: Secondary | ICD-10-CM | POA: Diagnosis not present

## 2024-03-08 LAB — COMPREHENSIVE METABOLIC PANEL WITH GFR
ALT: 16 U/L (ref 0–44)
AST: 21 U/L (ref 15–41)
Albumin: 3.4 g/dL — ABNORMAL LOW (ref 3.5–5.0)
Alkaline Phosphatase: 60 U/L (ref 38–126)
Anion gap: 13 (ref 5–15)
BUN: 9 mg/dL (ref 8–23)
CO2: 18 mmol/L — ABNORMAL LOW (ref 22–32)
Calcium: 8.7 mg/dL — ABNORMAL LOW (ref 8.9–10.3)
Chloride: 106 mmol/L (ref 98–111)
Creatinine, Ser: 0.88 mg/dL (ref 0.61–1.24)
GFR, Estimated: >60 mL/min (ref >60)
Glucose, Bld: 98 mg/dL (ref 70–99)
Potassium: 4.1 mmol/L (ref 3.5–5.1)
Sodium: 137 mmol/L (ref 135–145)
Total Bilirubin: 0.5 mg/dL (ref 0.0–1.2)
Total Protein: 6.2 g/dL — ABNORMAL LOW (ref 6.5–8.1)

## 2024-03-08 LAB — CBC WITH DIFFERENTIAL/PLATELET
Abs Immature Granulocytes: 0.02 K/uL (ref 0.00–0.07)
Basophils Absolute: 0.1 K/uL (ref 0.0–0.1)
Basophils Relative: 1 %
Eosinophils Absolute: 0.1 K/uL (ref 0.0–0.5)
Eosinophils Relative: 2 %
HCT: 37.7 % — ABNORMAL LOW (ref 39.0–52.0)
Hemoglobin: 12.6 g/dL — ABNORMAL LOW (ref 13.0–17.0)
Immature Granulocytes: 0 %
Lymphocytes Relative: 26 %
Lymphs Abs: 2 K/uL (ref 0.7–4.0)
MCH: 32.6 pg (ref 26.0–34.0)
MCHC: 33.4 g/dL (ref 30.0–36.0)
MCV: 97.7 fL (ref 80.0–100.0)
Monocytes Absolute: 0.7 K/uL (ref 0.1–1.0)
Monocytes Relative: 9 %
Neutro Abs: 4.8 K/uL (ref 1.7–7.7)
Neutrophils Relative %: 62 %
Platelets: 234 K/uL (ref 150–400)
RBC: 3.86 MIL/uL — ABNORMAL LOW (ref 4.22–5.81)
RDW: 13.9 % (ref 11.5–15.5)
WBC: 7.7 K/uL (ref 4.0–10.5)
nRBC: 0 % (ref 0.0–0.2)

## 2024-03-08 LAB — URINALYSIS, ROUTINE W REFLEX MICROSCOPIC
Bilirubin Urine: NEGATIVE
Glucose, UA: NEGATIVE mg/dL
Hgb urine dipstick: NEGATIVE
Ketones, ur: NEGATIVE mg/dL
Leukocytes,Ua: NEGATIVE
Nitrite: NEGATIVE
Protein, ur: NEGATIVE mg/dL
Specific Gravity, Urine: 1.014 (ref 1.005–1.030)
pH: 6 (ref 5.0–8.0)

## 2024-03-08 LAB — RAPID URINE DRUG SCREEN, HOSP PERFORMED
Amphetamines: NOT DETECTED
Barbiturates: NOT DETECTED
Benzodiazepines: POSITIVE — AB
Cocaine: NOT DETECTED
Opiates: NOT DETECTED
Tetrahydrocannabinol: POSITIVE — AB

## 2024-03-08 LAB — ETHANOL: Alcohol, Ethyl (B): <15 mg/dL (ref <15)

## 2024-03-08 MED ORDER — ZONISAMIDE 100 MG PO CAPS: 300.0000 mg | ORAL_CAPSULE | Freq: Every day | ORAL | Status: DC

## 2024-03-08 NOTE — ED Notes (Signed)
 Pt provided discharge instructions and prescription information. Pt was given the opportunity to ask questions and questions were answered.

## 2024-03-08 NOTE — ED Triage Notes (Signed)
 Pt BIB [facility] from home. Pt has a hx of sz and had a witnessed sz by his wife today. It was described as grand mal activity that lasted approximately 1 min. The wife administered 5 mg of diazepam  intranasally. When Fd arrived pt was described as postictal and was A/Ox4 when EMS arrived. Pt does have some mild trauma to the tongue and urinary incontinence present. Pt reports taken his medication as prescribed with no recent changes in dosing. Denies recent illness.   EMS Vitals  126/78 HR 90 SpO2 94% CBG 131

## 2024-03-08 NOTE — ED Provider Notes (Cosign Needed Addendum)
 [place] EMERGENCY DEPARTMENT AT [facility] Provider Note   CSN: [id number] Arrival date & time: 03/08/24  1811     Patient presents with: Seizures   Terry Guerrero is a 63 y.o. male.   Patient to ED by EMS reporting a one minute grand mall seizure while at home. The patient does not remember events. History of seizures in the past, currently taking Zonegran  and Vimpat . He states he has been compliant with all medications. He denies alcohol  or drug use. Brief post-ictal phase per EMS, back to baseline on arrival to ED. No recent illness, fever, vomiting. He denies sleep problems, current heightened stress.   The history is provided by the patient and the EMS personnel. No language interpreter was used.  Seizures      Prior to Admission medications   Medication Sig Start Date End Date Taking? Authorizing Provider  adalimumab  (HUMIRA , 2 PEN,) 40 MG/0.4ML pen Inject 0.4 mLs (40 mg total) into the skin every 14 (fourteen) days. 02/04/24   Cheryl Waddell HERO, PA-C  diazePAM , 15 MG Dose, (VALTOCO  15 MG DOSE) 2 x 7.5 MG/0.1ML LQPK Administer 1 spray in 1 nostril, second spray in other nostril (one dose) for seizure longer than 5 minutes. May use second dose after 4 hours if needed 10/08/23   Cleotilde Rogue, MD  lacosamide  (VIMPAT ) 200 MG TABS tablet Take 1 tablet (200 mg total) by mouth 2 (two) times daily. 11/13/23   Georjean Darice HERO, MD  Multiple Vitamin (MULTIVITAMIN) tablet Take 1 tablet by mouth daily.    [provider]  Pyridoxine HCl (VITAMIN B-6 PO) Take by mouth.    [provider]  tiZANidine  (ZANAFLEX ) 4 MG tablet Take 1 tablet (4 mg total) by mouth every 8 (eight) hours as needed for muscle spasms. 12/30/23   Duanne Butler DASEN, MD  zonisamide  (ZONEGRAN ) 100 MG capsule Take 1 capsule every night for 2 weeks, then increase to 2 capsules every night for 2 weeks, then increase to 3 capsules every night 09/09/23   Georjean Darice HERO, MD    Allergies:  Gold-containing drug products, Lamisil [terbinafine], Naprosyn [naproxen], and Oxycodone     Review of Systems  Neurological:  Positive for seizures.    Updated Vital Signs BP 109/76   Pulse 82   Temp 98 F (36.7 C) (Oral)   Resp 12   Ht 5' 6 (1.676 m)   Wt 59 kg   SpO2 96%   BMI 20.98 kg/m   Physical Exam Constitutional:      Appearance: He is well-developed.  HENT:     Head: Normocephalic.     Mouth/Throat:     Comments: Tip of tongue is bruised. No active bleeding. No laceration. Cardiovascular:     Rate and Rhythm: Normal rate and regular rhythm.     Heart sounds: No murmur heard. Pulmonary:     Effort: Pulmonary effort is normal.     Breath sounds: Normal breath sounds. No wheezing, rhonchi or rales.  Abdominal:     Palpations: Abdomen is soft.     Tenderness: There is no abdominal tenderness. There is no guarding or rebound.  Musculoskeletal:        General: Normal range of motion.     Cervical back: Normal range of motion and neck supple.  Skin:    General: Skin is warm and dry.  Neurological:     General: No focal deficit present.     Mental Status: He is alert and oriented to  person, place, and time.     GCS: GCS eye subscore is 4. GCS verbal subscore is 5. GCS motor subscore is 6.     Cranial Nerves: No cranial nerve deficit, dysarthria or facial asymmetry.     Sensory: Sensation is intact.     Motor: No weakness or pronator drift.     Deep Tendon Reflexes:     Reflex Scores:      Patellar reflexes are 2+ on the right side and 2+ on the left side.    (all labs ordered are listed, but only abnormal results are displayed) Labs Reviewed  CBC WITH DIFFERENTIAL/PLATELET - Abnormal; Notable for the following components:      Result Value   RBC 3.86 (*)    Hemoglobin 12.6 (*)    HCT 37.7 (*)    All other components within normal limits  COMPREHENSIVE METABOLIC PANEL WITH GFR - Abnormal; Notable for the following components:   CO2 18 (*)    Calcium 8.7  (*)    Total Protein 6.2 (*)    Albumin 3.4 (*)    All other components within normal limits  URINALYSIS, ROUTINE W REFLEX MICROSCOPIC - Abnormal; Notable for the following components:   APPearance CLOUDY (*)    All other components within normal limits  RAPID URINE DRUG SCREEN, HOSP PERFORMED - Abnormal; Notable for the following components:   Benzodiazepines POSITIVE (*)    Tetrahydrocannabinol POSITIVE (*)    All other components within normal limits  ETHANOL    EKG: None  Radiology: No results found.   Procedures   Medications Ordered in the ED - No data to display  Clinical Course as of 03/08/24 2129  Sun Mar 08, 2024  1911 Chart reviewed. Neurology note references history of Absentee seizures and black out seizures. The blackout type resolved with placement of a pacemaker in February of this year. He is followed by Dr. Gregg (neuro) with EEG 10/6, on Zonegran  200 mg at bedtime and Vimpat  BID. Zonegran  was decreased recently to 200 mg at night. Patient reports he has an MRI scheduled for further investigation.   Patient's presentation discussed with neurology, Dr. Vanessa. He recommends increasing Zonegran  by one tablet, close neuro follow up.    [SU]  2043 Labs reviewed and are reassuring. Mildly acidotic, CO2 18. No leukocytosis, no anemia, no electrolyte abnormalities. No infection. Patient has remained baseline since arrival. Will discharge home with increase to Zonegran  to 3 tablets (300 mg) at night. Offered dosing prior to discharge and patient states he will take his own medication at home. He is felt appropriate for discharge.  [SU]  2127 After discharge, the patient and wife state they would like to go ahead and get the night time dose of Zonegran , which was ordered for the patient.  [SU]    Clinical Course User Index [SU] Odell Balls, PA-C                                 Medical Decision Making Amount and/or Complexity of Data Reviewed Labs:  ordered.  Risk Prescription drug management.        Final diagnoses:  Seizure [facility])  Seizure disorder [facility])    ED Discharge Orders     None          Odell Balls, PA-C 03/08/24 2048    Odell Balls, PA-C 03/08/24 2129    Elnor Bernarda SQUIBB, DO 03/11/24 0006

## 2024-03-08 NOTE — Discharge Instructions (Signed)
 As we discussed, your labs are reassuring. You can be discharged home tonight. Increase your Zonegran  dose to 3 tablets (300 mg total) at night. Call Dr. Janean office in the morning and let them know you have a seizure tonight and they will direct follow up.   Return to the ED with any new or concerning symptoms at any time.

## 2024-03-09 ENCOUNTER — Other Ambulatory Visit: Payer: Self-pay | Admitting: Family Medicine

## 2024-03-09 ENCOUNTER — Telehealth: Payer: Self-pay | Admitting: Neurology

## 2024-03-09 NOTE — Telephone Encounter (Signed)
 Returned call patient who reported grand mal seizure last night. And was taken to the ER. They increased the zonegran  back to 300 mg nightly.  He reports at last office visit the zonegran  was decreased to 200 mg daily and vimpat  stayed the same. He denied other seizure triggers. Advised I would send to Dr. Gregg to review and continue meds at prescribed in the ER until Dr. Ebbie weighs in. I reviewed seizure precautions and [place] driving laws.

## 2024-03-09 NOTE — Telephone Encounter (Signed)
 Pt reports Dr Gregg decreased the dose of his zonisamide  (ZONEGRAN ) 100 MG capsule .  Pt states he had a seizure yesterday and was taken to ED by ambulance.  The hospital increased his zonisamide  (ZONEGRAN ) 100 MG capsule back to the original dose, pt would like a call from RN to discuss.

## 2024-03-11 NOTE — Telephone Encounter (Signed)
 Please advise patient to continue with Zonisamide  300 at night and please add him to the cancellation list. Thanks

## 2024-03-12 NOTE — Telephone Encounter (Signed)
 Requested medications are due for refill today.  yes  Requested medications are on the active medications list.  yes  Last refill. 12/30/2023 #30 0 rf  Future visit scheduled.   no  Notes to clinic.  Refill not delegated.    Requested Prescriptions  Pending Prescriptions Disp Refills   tiZANidine  (ZANAFLEX ) 4 MG tablet 30 tablet 0    Sig: Take 1 tablet (4 mg total) by mouth every 8 (eight) hours as needed for muscle spasms.     Not Delegated - Cardiovascular:  Alpha-2 Agonists - tizanidine  Failed - 03/12/2024  1:09 PM      Failed - This refill cannot be delegated      Failed - Valid encounter within last 6 months    Recent Outpatient Visits           7 months ago Strain of lumbar region, initial encounter   [place] [facility] Kayla Jeoffrey RAMAN, FNP   1 year ago Otomycosis of right ear   [place] [facility] Kayla Jeoffrey RAMAN, FNP   1 year ago Closed stable burst fracture of first lumbar vertebra with routine healing, subsequent encounter   [place] [facility] Duanne Butler DASEN, MD   2 years ago Acute pain of left knee   [place] [facility] Pickard, Butler DASEN, MD       Future Appointments             In 4 months Deveshwar, Maya, MD [facility] [place]

## 2024-03-13 ENCOUNTER — Ambulatory Visit (HOSPITAL_BASED_OUTPATIENT_CLINIC_OR_DEPARTMENT_OTHER)
Admission: RE | Admit: 2024-03-13 | Discharge: 2024-03-13 | Disposition: A | Discharge status: Home / Self Care | Source: Ambulatory Visit | Attending: Rheumatology | Admitting: Rheumatology

## 2024-03-13 ENCOUNTER — Encounter (HOSPITAL_BASED_OUTPATIENT_CLINIC_OR_DEPARTMENT_OTHER): Payer: Self-pay

## 2024-03-13 ENCOUNTER — Other Ambulatory Visit (HOSPITAL_COMMUNITY): Payer: Self-pay

## 2024-03-13 DIAGNOSIS — M8589 Other specified disorders of bone density and structure, multiple sites: Secondary | ICD-10-CM | POA: Diagnosis not present

## 2024-03-13 MED ORDER — TIZANIDINE HCL 4 MG PO TABS
4.0000 mg | ORAL_TABLET | Freq: Three times a day (TID) | ORAL | 0 refills | Status: AC | PRN
  Filled 2024-03-13: qty 30, 10d supply, fill #0

## 2024-03-15 ENCOUNTER — Ambulatory Visit: Payer: Self-pay | Admitting: Rheumatology

## 2024-03-15 DIAGNOSIS — E559 Vitamin D deficiency, unspecified: Secondary | ICD-10-CM

## 2024-03-15 NOTE — Progress Notes (Signed)
 DEXA scan is suggestive of osteopenia.  No comparison available however the T-score is lower compared to the previous study 2 years ago.  Patient should take calcium with vitamin D and exercise on a regular basis.  Total calcium intake should be 1200 mg daily mostly from diet.  Smoking is strongly associated with osteoporosis.  Patient should try to quit smoking.  We will check vitamin D level with his next labs.

## 2024-03-16 ENCOUNTER — Other Ambulatory Visit: Payer: Self-pay

## 2024-03-23 ENCOUNTER — Other Ambulatory Visit: Payer: Self-pay

## 2024-03-23 ENCOUNTER — Other Ambulatory Visit: Payer: Self-pay | Admitting: Pharmacy Technician

## 2024-03-23 NOTE — Progress Notes (Signed)
 Specialty Pharmacy Refill Coordination Note  HEZIKIAH RETZLOFF is a 63 y.o. male contacted today regarding refills of specialty medication(s) Adalimumab  (Humira  (2 Pen))   Patient requested (Patient-Rptd) Delivery   Delivery date: 04/01/2024 Verified address: (Patient-Rptd) [address] [place] [address]   Medication will be filled on: 03/31/2024

## 2024-03-25 ENCOUNTER — Other Ambulatory Visit: Payer: Self-pay | Admitting: Neurology

## 2024-03-31 ENCOUNTER — Other Ambulatory Visit: Payer: Self-pay

## 2024-04-08 ENCOUNTER — Other Ambulatory Visit (HOSPITAL_COMMUNITY): Payer: Self-pay

## 2024-04-10 ENCOUNTER — Ambulatory Visit: Payer: Medicare HMO

## 2024-04-10 DIAGNOSIS — I495 Sick sinus syndrome: Secondary | ICD-10-CM | POA: Diagnosis not present

## 2024-04-11 LAB — CUP PACEART REMOTE DEVICE CHECK
Battery Remaining Longevity: 174 mo
Battery Voltage: 3.15 V
Brady Statistic AP VP Percent: 0.02 %
Brady Statistic AP VS Percent: 3.55 %
Brady Statistic AS VP Percent: 0.05 %
Brady Statistic AS VS Percent: 96.37 %
Brady Statistic RA Percent Paced: 3.58 %
Brady Statistic RV Percent Paced: 0.07 %
Date Time Interrogation Session: 20251218233419
Implantable Lead Connection Status: 753985
Implantable Lead Connection Status: 753985
Implantable Lead Implant Date: 20250205
Implantable Lead Implant Date: 20250205
Implantable Lead Location: 753859
Implantable Lead Location: 753860
Implantable Lead Model: 5076
Implantable Lead Model: 5076
Implantable Pulse Generator Implant Date: 20250205
Lead Channel Impedance Value: 304 Ohm
Lead Channel Impedance Value: 399 Ohm
Lead Channel Impedance Value: 456 Ohm
Lead Channel Impedance Value: 532 Ohm
Lead Channel Pacing Threshold Amplitude: 0.625 V
Lead Channel Pacing Threshold Amplitude: 1 V
Lead Channel Pacing Threshold Pulse Width: 0.4 ms
Lead Channel Pacing Threshold Pulse Width: 0.4 ms
Lead Channel Sensing Intrinsic Amplitude: 3.25 mV
Lead Channel Sensing Intrinsic Amplitude: 3.25 mV
Lead Channel Sensing Intrinsic Amplitude: 31.625 mV
Lead Channel Sensing Intrinsic Amplitude: 31.625 mV
Lead Channel Setting Pacing Amplitude: 1.5 V
Lead Channel Setting Pacing Amplitude: 2 V
Lead Channel Setting Pacing Pulse Width: 0.4 ms
Lead Channel Setting Sensing Sensitivity: 1.2 mV
Zone Setting Status: 755011

## 2024-04-13 NOTE — Progress Notes (Signed)
 Remote PPM Transmission

## 2024-04-17 ENCOUNTER — Other Ambulatory Visit (HOSPITAL_COMMUNITY): Payer: Self-pay

## 2024-04-19 ENCOUNTER — Ambulatory Visit: Payer: Self-pay | Admitting: Internal Medicine

## 2024-04-21 ENCOUNTER — Other Ambulatory Visit: Payer: Self-pay

## 2024-04-21 ENCOUNTER — Other Ambulatory Visit: Payer: Self-pay | Admitting: Physician Assistant

## 2024-04-21 DIAGNOSIS — Z8719 Personal history of other diseases of the digestive system: Secondary | ICD-10-CM

## 2024-04-21 DIAGNOSIS — M0609 Rheumatoid arthritis without rheumatoid factor, multiple sites: Secondary | ICD-10-CM

## 2024-04-21 DIAGNOSIS — Z79899 Other long term (current) drug therapy: Secondary | ICD-10-CM

## 2024-04-21 MED ORDER — HUMIRA (2 PEN) 40 MG/0.4ML ~~LOC~~ AJKT
40.0000 mg | AUTO-INJECTOR | SUBCUTANEOUS | 2 refills | Status: AC
  Filled 2024-04-21 – 2024-04-22 (×2): qty 0.8, 28d supply, fill #0
  Filled 2024-05-15: qty 0.8, 28d supply, fill #1

## 2024-04-21 NOTE — Telephone Encounter (Signed)
 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   Okay to refill Humira ?

## 2024-04-22 ENCOUNTER — Other Ambulatory Visit: Payer: Self-pay

## 2024-04-22 ENCOUNTER — Other Ambulatory Visit (HOSPITAL_COMMUNITY): Payer: Self-pay

## 2024-04-22 NOTE — Progress Notes (Signed)
 Specialty Pharmacy Refill Coordination Note  Terry Guerrero is a 63 y.o. male contacted today regarding refills of specialty medication(s) Adalimumab  (Humira  (2 Pen))   Patient requested Delivery   Delivery date: 04/28/24   Verified address: [address] [place] [address]   Medication will be filled on: 04/27/24

## 2024-04-27 ENCOUNTER — Other Ambulatory Visit: Payer: Self-pay

## 2024-04-27 NOTE — Progress Notes (Signed)
 Specialty Pharmacy Ongoing Clinical Assessment Note  AFFAN CALLOW is a 64 y.o. male who is being followed by the specialty pharmacy service for RxSp Rheumatoid Arthritis   Patient's specialty medication(s) reviewed today: Adalimumab  (Humira  (2 Pen))   Missed doses in the last 4 weeks: 0   Patient/Caregiver did not have any additional questions or concerns.   Therapeutic benefit summary: Patient is achieving benefit   Adverse events/side effects summary: No adverse events/side effects   Patient's therapy is appropriate to: Continue (Patient reports mild cold right now but he is improving. Will inform provider if worsens in case dose needs to be held/delayed.)    Goals Addressed             This Visit's Progress    Maintain optimal adherence to therapy   On track    Patient is on track. Patient will maintain adherence and adhere to provider and/or lab appointments         Follow up: 12 months  Maurisio Ruddy M Kitrina Maurin Specialty Pharmacist

## 2024-05-12 ENCOUNTER — Emergency Department (HOSPITAL_COMMUNITY): Admission: EM | Admit: 2024-05-12 | Discharge: 2024-05-12 | Disposition: A | Discharge status: Home / Self Care

## 2024-05-12 DIAGNOSIS — R569 Unspecified convulsions: Secondary | ICD-10-CM | POA: Diagnosis present

## 2024-05-12 LAB — CBC WITH DIFFERENTIAL/PLATELET
Abs Immature Granulocytes: 0.03 K/uL (ref 0.00–0.07)
Basophils Absolute: 0.1 K/uL (ref 0.0–0.1)
Basophils Relative: 1 %
Eosinophils Absolute: 0.2 K/uL (ref 0.0–0.5)
Eosinophils Relative: 2 %
HCT: 33.3 % — ABNORMAL LOW (ref 39.0–52.0)
Hemoglobin: 11.1 g/dL — ABNORMAL LOW (ref 13.0–17.0)
Immature Granulocytes: 0 %
Lymphocytes Relative: 22 %
Lymphs Abs: 2.1 K/uL (ref 0.7–4.0)
MCH: 32.5 pg (ref 26.0–34.0)
MCHC: 33.3 g/dL (ref 30.0–36.0)
MCV: 97.4 fL (ref 80.0–100.0)
Monocytes Absolute: 0.9 K/uL (ref 0.1–1.0)
Monocytes Relative: 9 %
Neutro Abs: 6.6 K/uL (ref 1.7–7.7)
Neutrophils Relative %: 66 %
Platelets: 224 K/uL (ref 150–400)
RBC: 3.42 MIL/uL — ABNORMAL LOW (ref 4.22–5.81)
RDW: 13.2 % (ref 11.5–15.5)
WBC: 9.8 K/uL (ref 4.0–10.5)
nRBC: 0 % (ref 0.0–0.2)

## 2024-05-12 LAB — BASIC METABOLIC PANEL WITH GFR
Anion gap: 12 (ref 5–15)
BUN: 11 mg/dL (ref 8–23)
CO2: 19 mmol/L — ABNORMAL LOW (ref 22–32)
Calcium: 8.4 mg/dL — ABNORMAL LOW (ref 8.9–10.3)
Chloride: 101 mmol/L (ref 98–111)
Creatinine, Ser: 1.03 mg/dL (ref 0.61–1.24)
GFR, Estimated: >60 mL/min
Glucose, Bld: 92 mg/dL (ref 70–99)
Potassium: 4 mmol/L (ref 3.5–5.1)
Sodium: 133 mmol/L — ABNORMAL LOW (ref 135–145)

## 2024-05-12 MED ORDER — ZONISAMIDE 100 MG PO CAPS
300.0000 mg | ORAL_CAPSULE | Freq: Once | ORAL | Status: AC
  Administered 2024-05-12: 300 mg via ORAL
  Filled 2024-05-12: qty 3

## 2024-05-12 MED ORDER — LACOSAMIDE 50 MG PO TABS
200.0000 mg | ORAL_TABLET | Freq: Once | ORAL | Status: AC
  Administered 2024-05-12: 200 mg via ORAL
  Filled 2024-05-12: qty 4

## 2024-05-12 MED ORDER — DILTIAZEM HCL-DEXTROSE 125-5 MG/125ML-% IV SOLN (PREMIX): 5.0000 mg/h | INTRAVENOUS | Status: DC

## 2024-05-12 MED ORDER — DILTIAZEM LOAD VIA INFUSION
10.0000 mg | Freq: Once | INTRAVENOUS | Status: DC
  Filled 2024-05-12: qty 10

## 2024-05-12 MED ORDER — LACTATED RINGERS IV BOLUS
1000.0000 mL | Freq: Once | INTRAVENOUS | Status: AC
  Administered 2024-05-12: 1000 mL via INTRAVENOUS

## 2024-05-12 NOTE — Discharge Instructions (Signed)
 Stay on your medications as prescribed.  Call and follow-up with your urologist

## 2024-05-12 NOTE — ED Triage Notes (Signed)
 Pt arrived via EMS due to seizure at home. Per girlfriend seizure lasted 3-4 mins. Pt has hx and takes Zonisamide . Girlfriend gave rescue Diazepam  15mg  Nasal.

## 2024-05-12 NOTE — ED Provider Notes (Signed)
 " [place] [facility]MENT AT [facility] Provider Note   CSN: [id number] Arrival date & time: 05/12/24  8079     Patient presents with: Seizures   Terry Guerrero is a 64 y.o. male.   64 year old male presents for evaluation of seizures.  Patient is postictal and sleepy but does wake up.  Seems somewhat confused.  He does state he has been compliant with his medications, however he is a somewhat unreliable historian at this time.  Per EMS report patient's girlfriend gave him his rescue medicine of diazepam .  He is reportedly on Zonisimide and Vimpat .   Seizures      Prior to Admission medications  Medication Sig Start Date End Date Taking? Authorizing Provider  adalimumab  (HUMIRA , 2 PEN,) 40 MG/0.4ML pen Inject 0.4 mLs (40 mg total) into the skin every 14 (fourteen) days. 04/21/24   Cheryl Waddell HERO, PA-C  diazePAM , 15 MG Dose, (VALTOCO  15 MG DOSE) 2 x 7.5 MG/0.1ML LQPK Administer 1 spray in 1 nostril, second spray in other nostril (one dose) for seizure longer than 5 minutes. May use second dose after 4 hours if needed 10/08/23   Cleotilde Rogue, MD  lacosamide  (VIMPAT ) 200 MG TABS tablet Take 1 tablet (200 mg total) by mouth 2 (two) times daily. 11/13/23   Georjean Darice HERO, MD  Multiple Vitamin (MULTIVITAMIN) tablet Take 1 tablet by mouth daily.    [provider]  Pyridoxine HCl (VITAMIN B-6 PO) Take by mouth.    [provider]  tiZANidine  (ZANAFLEX ) 4 MG tablet Take 1 tablet (4 mg total) by mouth every 8 (eight) hours as needed for muscle spasms. 03/13/24   Duanne Butler DASEN, MD  zonisamide  (ZONEGRAN ) 100 MG capsule Take 300 mg by mouth daily. Was increased to this dose in the ER 03/08/25 per patient    [provider]    Allergies: Gold-containing drug products, Lamisil [terbinafine], Naprosyn [naproxen], and Oxycodone     Review of Systems  Constitutional:  Negative for chills and fever.  HENT:  Negative for ear pain and sore throat.    Eyes:  Negative for pain and visual disturbance.  Respiratory:  Negative for cough and shortness of breath.   Cardiovascular:  Negative for chest pain and palpitations.  Gastrointestinal:  Negative for abdominal pain and vomiting.  Genitourinary:  Negative for dysuria and hematuria.  Musculoskeletal:  Negative for arthralgias and back pain.  Skin:  Negative for color change and rash.  Neurological:  Positive for seizures. Negative for syncope.  All other systems reviewed and are negative.   Updated Vital Signs BP 113/76   Pulse 81   Temp 98 F (36.7 C) (Oral)   Resp 19   Ht 5' 6 (1.676 m)   Wt 63.5 kg   SpO2 100%   BMI 22.60 kg/m   Physical Exam Vitals and nursing note reviewed.  Constitutional:      General: He is not in acute distress.    Appearance: Normal appearance. He is well-developed. He is not ill-appearing.  HENT:     Head: Normocephalic and atraumatic.  Eyes:     Conjunctiva/sclera: Conjunctivae normal.  Cardiovascular:     Rate and Rhythm: Normal rate and regular rhythm.     Heart sounds: No murmur heard. Pulmonary:     Effort: Pulmonary effort is normal. No respiratory distress.     Breath sounds: Normal breath sounds.  Abdominal:     Palpations: Abdomen is soft.     Tenderness: There  is no abdominal tenderness.  Musculoskeletal:        General: No swelling.     Cervical back: Neck supple.  Skin:    General: Skin is warm and dry.     Capillary Refill: Capillary refill takes less than 2 seconds.  Neurological:     General: No focal deficit present.     Mental Status: He is alert.  Psychiatric:        Mood and Affect: Mood normal.     (all labs ordered are listed, but only abnormal results are displayed) Labs Reviewed  BASIC METABOLIC PANEL WITH GFR - Abnormal; Notable for the following components:      Result Value   Sodium 133 (*)    CO2 19 (*)    Calcium 8.4 (*)    All other components within normal limits  CBC WITH DIFFERENTIAL/PLATELET  - Abnormal; Notable for the following components:   RBC 3.42 (*)    Hemoglobin 11.1 (*)    HCT 33.3 (*)    All other components within normal limits  URINALYSIS, ROUTINE W REFLEX MICROSCOPIC  URINE DRUG SCREEN    EKG: None  Radiology: No results found.   Procedures   Medications Ordered in the ED  lactated ringers  bolus 1,000 mL (1,000 mLs Intravenous New Bag/Given 05/12/24 2000)  zonisamide  (ZONEGRAN ) capsule 300 mg (300 mg Oral Given 05/12/24 2023)  lacosamide  (VIMPAT ) tablet 200 mg (200 mg Oral Given 05/12/24 2022)                                    Medical Decision Making Social determinants of health: History of alcohol  use  Patient here for breakthrough seizure.  Was aborted when girlfriend gave him diazepam  his rectum rescue medicine.  He states has been compliant with medications.  Was given his nighttime doses of medicines here.  He is back to baseline, is eating Cheetos with stable vital signs and normal lab workup.  Girlfriend at bedside.  Patient told he is not able to drive for the next 6 months until he is seizure-free or cleared by neuro.  Strongly advised to continue his medications as prescribed and obtain follow-up with neurology.  Advised return for new or worsening symptoms.  He feels comfortable to plan be discharged home.  Problems Addressed: Seizure [facility]): acute illness or injury  Amount and/or Complexity of Data Reviewed External Data Reviewed: notes.    Details: Prior ED records read and patient seen 03-08-24 for seizure Labs: ordered. Decision-making details documented in ED Course.    Details: Ordered and reviewed by me and unremarkable ECG/medicine tests: ordered and independent interpretation performed. Decision-making details documented in ED Course.    Details: Ordered and interpreted by me in the absence of cardiology and shows sinus rhythm, no STEMI, or significant change when compared to prior EKG  Risk OTC drugs. Prescription drug  management. Diagnosis or treatment significantly limited by social determinants of health.     Final diagnoses:  Seizure [facility])    ED Discharge Orders     None          Terry Duwaine CROME, DO 05/12/24 2231  "

## 2024-05-15 ENCOUNTER — Other Ambulatory Visit (HOSPITAL_COMMUNITY): Payer: Self-pay

## 2024-05-19 ENCOUNTER — Other Ambulatory Visit: Payer: Self-pay

## 2024-05-19 ENCOUNTER — Other Ambulatory Visit (HOSPITAL_COMMUNITY): Payer: Self-pay

## 2024-05-20 ENCOUNTER — Telehealth: Payer: Self-pay

## 2024-05-20 NOTE — Telephone Encounter (Signed)
 Received fax from [place] stating that pt has been provided with a temporary supply of medication as Humira  is no longer on the formulary. Will attempt to run PA to determine if pt will ultimately need to switch to a biosimilar.  Submitted an URGENT Prior Authorization request to [facility] for HUMIRA  via CoverMyMeds. Will update once we receive a response.  Key: [id number]

## 2024-05-20 NOTE — Telephone Encounter (Signed)
 Received notification from [facility] regarding a prior authorization for HUMIRA . Authorization has been APPROVED from 04/23/2024 to 04/22/2025. Approval letter sent to scan center.  Authorization # [id number]

## 2024-05-21 ENCOUNTER — Other Ambulatory Visit: Payer: Self-pay

## 2024-05-22 ENCOUNTER — Other Ambulatory Visit: Payer: Self-pay | Admitting: Neurology

## 2024-05-24 ENCOUNTER — Telehealth: Payer: Self-pay | Admitting: Diagnostic Neuroimaging

## 2024-05-24 MED ORDER — LACOSAMIDE 200 MG PO TABS: 200.0000 mg | ORAL_TABLET | Freq: Two times a day (BID) | ORAL | 3 refills | Status: AC

## 2024-05-24 NOTE — Telephone Encounter (Signed)
 Patient called in for refill.  Meds ordered this encounter  Medications   lacosamide  (VIMPAT ) 200 MG TABS tablet    Sig: Take 1 tablet (200 mg total) by mouth 2 (two) times daily.    Dispense:  60 tablet    Refill:  3    EDUARD FABIENE HANLON, MD 05/24/2024, 6:49 PM Certified in Neurology, Neurophysiology and Neuroimaging  [facility] [address] [place], [place] [address] [phone]

## 2024-08-05 ENCOUNTER — Ambulatory Visit: Admitting: Rheumatology

## 2024-09-21 ENCOUNTER — Ambulatory Visit: Admitting: Neurology
